# Patient Record
Sex: Female | Born: 1985 | Race: Black or African American | Hispanic: No | Marital: Single | Smoking: Never smoker
Health system: Southern US, Community
[De-identification: ages and names within clinical notes are randomized; demographics above are authoritative.]

## PROBLEM LIST (undated history)

## (undated) DIAGNOSIS — Z8751 Personal history of pre-term labor: Secondary | ICD-10-CM

## (undated) DIAGNOSIS — I959 Hypotension, unspecified: Secondary | ICD-10-CM

## (undated) DIAGNOSIS — M419 Scoliosis, unspecified: Secondary | ICD-10-CM

## (undated) DIAGNOSIS — IMO0002 Reserved for concepts with insufficient information to code with codable children: Secondary | ICD-10-CM

## (undated) DIAGNOSIS — Z8759 Personal history of other complications of pregnancy, childbirth and the puerperium: Secondary | ICD-10-CM

## (undated) DIAGNOSIS — B977 Papillomavirus as the cause of diseases classified elsewhere: Secondary | ICD-10-CM

## (undated) DIAGNOSIS — F319 Bipolar disorder, unspecified: Secondary | ICD-10-CM

## (undated) DIAGNOSIS — B009 Herpesviral infection, unspecified: Secondary | ICD-10-CM

## (undated) DIAGNOSIS — F419 Anxiety disorder, unspecified: Secondary | ICD-10-CM

## (undated) DIAGNOSIS — D649 Anemia, unspecified: Secondary | ICD-10-CM

## (undated) HISTORY — DX: Reserved for concepts with insufficient information to code with codable children: IMO0002

## (undated) HISTORY — DX: Papillomavirus as the cause of diseases classified elsewhere: B97.7

## (undated) HISTORY — PX: INDUCED ABORTION: SHX677

## (undated) HISTORY — PX: ABDOMINAL HYSTERECTOMY: SHX81

## (undated) HISTORY — PX: CERVICAL CERCLAGE: SHX1329

---
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
## 2012-04-03 DIAGNOSIS — M542 Cervicalgia: Secondary | ICD-10-CM | POA: Insufficient documentation

## 2013-06-08 DIAGNOSIS — IMO0002 Reserved for concepts with insufficient information to code with codable children: Secondary | ICD-10-CM | POA: Insufficient documentation

## 2013-08-02 DIAGNOSIS — N9089 Other specified noninflammatory disorders of vulva and perineum: Secondary | ICD-10-CM | POA: Insufficient documentation

## 2013-08-19 DIAGNOSIS — M419 Scoliosis, unspecified: Secondary | ICD-10-CM | POA: Insufficient documentation

## 2013-08-19 DIAGNOSIS — F319 Bipolar disorder, unspecified: Secondary | ICD-10-CM | POA: Insufficient documentation

## 2013-09-22 DIAGNOSIS — F1120 Opioid dependence, uncomplicated: Secondary | ICD-10-CM | POA: Insufficient documentation

## 2013-10-06 ENCOUNTER — Emergency Department: Payer: Self-pay | Admitting: Emergency Medicine

## 2013-10-06 LAB — CBC WITH DIFFERENTIAL/PLATELET
BASOS ABS: 0 10*3/uL (ref 0.0–0.1)
Basophil %: 0.3 %
Eosinophil #: 0.1 10*3/uL (ref 0.0–0.7)
Eosinophil %: 1.4 %
HCT: 38.8 % (ref 35.0–47.0)
HGB: 12.9 g/dL (ref 12.0–16.0)
Lymphocyte #: 0.4 10*3/uL — ABNORMAL LOW (ref 1.0–3.6)
Lymphocyte %: 5.2 %
MCH: 29.3 pg (ref 26.0–34.0)
MCHC: 33.4 g/dL (ref 32.0–36.0)
MCV: 88 fL (ref 80–100)
MONOS PCT: 5.8 %
Monocyte #: 0.5 x10 3/mm (ref 0.2–0.9)
Neutrophil #: 6.8 10*3/uL — ABNORMAL HIGH (ref 1.4–6.5)
Neutrophil %: 87.3 %
PLATELETS: 156 10*3/uL (ref 150–440)
RBC: 4.42 10*6/uL (ref 3.80–5.20)
RDW: 14.6 % — ABNORMAL HIGH (ref 11.5–14.5)
WBC: 7.7 10*3/uL (ref 3.6–11.0)

## 2013-10-06 LAB — RAPID INFLUENZA A&B ANTIGENS (ARMC ONLY)

## 2013-10-06 LAB — BASIC METABOLIC PANEL
Anion Gap: 5 — ABNORMAL LOW (ref 7–16)
BUN: 4 mg/dL — AB (ref 7–18)
CHLORIDE: 104 mmol/L (ref 98–107)
CO2: 26 mmol/L (ref 21–32)
Calcium, Total: 8.4 mg/dL — ABNORMAL LOW (ref 8.5–10.1)
Creatinine: 0.53 mg/dL — ABNORMAL LOW (ref 0.60–1.30)
EGFR (African American): >60
Glucose: 77 mg/dL (ref 65–99)
OSMOLALITY: 266 (ref 275–301)
POTASSIUM: 3.6 mmol/L (ref 3.5–5.1)
Sodium: 135 mmol/L — ABNORMAL LOW (ref 136–145)

## 2013-10-10 DIAGNOSIS — M7918 Myalgia, other site: Secondary | ICD-10-CM | POA: Insufficient documentation

## 2013-10-27 DIAGNOSIS — F1120 Opioid dependence, uncomplicated: Secondary | ICD-10-CM | POA: Insufficient documentation

## 2013-10-30 ENCOUNTER — Observation Stay: Payer: Self-pay | Admitting: Obstetrics and Gynecology

## 2013-10-30 LAB — URINALYSIS, COMPLETE
BILIRUBIN, UR: NEGATIVE
Blood: NEGATIVE
Glucose,UR: NEGATIVE mg/dL (ref 0–75)
LEUKOCYTE ESTERASE: NEGATIVE
Nitrite: NEGATIVE
PH: 5 (ref 4.5–8.0)
Protein: 30
RBC,UR: 1 /HPF (ref 0–5)
Specific Gravity: 1.03 (ref 1.003–1.030)
Squamous Epithelial: 7

## 2013-11-01 LAB — URINE CULTURE

## 2013-11-25 DIAGNOSIS — Z006 Encounter for examination for normal comparison and control in clinical research program: Secondary | ICD-10-CM | POA: Insufficient documentation

## 2013-12-15 ENCOUNTER — Emergency Department: Payer: Self-pay | Admitting: Emergency Medicine

## 2014-01-11 ENCOUNTER — Observation Stay: Payer: Self-pay

## 2014-01-11 LAB — URINALYSIS, COMPLETE
Bilirubin,UR: NEGATIVE
Blood: NEGATIVE
GLUCOSE, UR: NEGATIVE mg/dL (ref 0–75)
KETONE: NEGATIVE
Nitrite: NEGATIVE
Ph: 7 (ref 4.5–8.0)
Protein: NEGATIVE
RBC,UR: 1 /HPF (ref 0–5)
Specific Gravity: 1.009 (ref 1.003–1.030)
WBC UR: 13 /HPF (ref 0–5)

## 2014-01-11 LAB — DRUG SCREEN, URINE

## 2014-01-11 LAB — FETAL FIBRONECTIN
Appearance: NORMAL
Fetal Fibronectin: NEGATIVE

## 2014-01-23 DIAGNOSIS — G43909 Migraine, unspecified, not intractable, without status migrainosus: Secondary | ICD-10-CM | POA: Insufficient documentation

## 2014-01-23 DIAGNOSIS — A5901 Trichomonal vulvovaginitis: Secondary | ICD-10-CM | POA: Insufficient documentation

## 2014-01-23 HISTORY — DX: Trichomonal vulvovaginitis: A59.01

## 2014-01-28 ENCOUNTER — Observation Stay: Payer: Self-pay | Admitting: Obstetrics and Gynecology

## 2014-01-29 LAB — URINALYSIS, COMPLETE
BACTERIA: NONE SEEN
BILIRUBIN, UR: NEGATIVE
Blood: NEGATIVE
GLUCOSE, UR: NEGATIVE mg/dL (ref 0–75)
Leukocyte Esterase: NEGATIVE
NITRITE: NEGATIVE
PH: 5 (ref 4.5–8.0)
PROTEIN: NEGATIVE
RBC,UR: 1 /HPF (ref 0–5)
Specific Gravity: 1.019 (ref 1.003–1.030)
WBC UR: 1 /HPF (ref 0–5)

## 2014-01-29 LAB — DRUG SCREEN, URINE
Amphetamines, Ur Screen: NEGATIVE (ref ?–1000)
BARBITURATES, UR SCREEN: NEGATIVE (ref ?–200)
Benzodiazepine, Ur Scrn: NEGATIVE (ref ?–200)
CANNABINOID 50 NG, UR ~~LOC~~: NEGATIVE (ref ?–50)
Cocaine Metabolite,Ur ~~LOC~~: NEGATIVE (ref ?–300)
MDMA (ECSTASY) UR SCREEN: NEGATIVE (ref ?–500)
METHADONE, UR SCREEN: NEGATIVE (ref ?–300)
OPIATE, UR SCREEN: NEGATIVE (ref ?–300)
PHENCYCLIDINE (PCP) UR S: NEGATIVE (ref ?–25)
TRICYCLIC, UR SCREEN: NEGATIVE (ref ?–1000)

## 2014-01-29 LAB — FETAL FIBRONECTIN
APPEARANCE: NORMAL
Fetal Fibronectin: NEGATIVE

## 2014-01-29 LAB — WET PREP, GENITAL

## 2014-01-29 LAB — GC/CHLAMYDIA PROBE AMP

## 2014-01-30 LAB — BETA STREP CULTURE(ARMC)

## 2014-02-13 ENCOUNTER — Observation Stay: Payer: Self-pay

## 2014-02-13 LAB — DRUG SCREEN, URINE
Amphetamines, Ur Screen: NEGATIVE (ref ?–1000)
BENZODIAZEPINE, UR SCRN: NEGATIVE (ref ?–200)
Barbiturates, Ur Screen: NEGATIVE (ref ?–200)
COCAINE METABOLITE, UR ~~LOC~~: NEGATIVE (ref ?–300)
Cannabinoid 50 Ng, Ur ~~LOC~~: NEGATIVE (ref ?–50)
MDMA (Ecstasy)Ur Screen: NEGATIVE (ref ?–500)
Methadone, Ur Screen: NEGATIVE (ref ?–300)
Opiate, Ur Screen: NEGATIVE (ref ?–300)
Phencyclidine (PCP) Ur S: NEGATIVE (ref ?–25)
Tricyclic, Ur Screen: NEGATIVE (ref ?–1000)

## 2014-02-20 ENCOUNTER — Observation Stay: Payer: Self-pay

## 2014-03-03 ENCOUNTER — Observation Stay: Payer: Self-pay | Admitting: Obstetrics and Gynecology

## 2014-03-04 ENCOUNTER — Observation Stay: Payer: Self-pay | Admitting: Emergency Medicine

## 2014-06-20 ENCOUNTER — Emergency Department: Payer: Self-pay | Admitting: Internal Medicine

## 2014-09-13 ENCOUNTER — Emergency Department: Payer: Self-pay | Admitting: Emergency Medicine

## 2014-10-11 ENCOUNTER — Emergency Department: Payer: Self-pay | Admitting: Emergency Medicine

## 2014-10-11 LAB — CBC WITH DIFFERENTIAL/PLATELET
BASOS PCT: 0.9 %
Basophil #: 0.1 10*3/uL (ref 0.0–0.1)
Eosinophil #: 0.1 10*3/uL (ref 0.0–0.7)
Eosinophil %: 1.9 %
HCT: 37 % (ref 35.0–47.0)
HGB: 12.3 g/dL (ref 12.0–16.0)
LYMPHS PCT: 37.7 %
Lymphocyte #: 2.2 10*3/uL (ref 1.0–3.6)
MCH: 28 pg (ref 26.0–34.0)
MCHC: 33.2 g/dL (ref 32.0–36.0)
MCV: 84 fL (ref 80–100)
Monocyte #: 0.4 x10 3/mm (ref 0.2–0.9)
Monocyte %: 7.4 %
NEUTROS ABS: 3.1 10*3/uL (ref 1.4–6.5)
Neutrophil %: 52.1 %
Platelet: 210 10*3/uL (ref 150–440)
RBC: 4.39 10*6/uL (ref 3.80–5.20)
RDW: 14.1 % (ref 11.5–14.5)
WBC: 5.9 10*3/uL (ref 3.6–11.0)

## 2014-10-11 LAB — COMPREHENSIVE METABOLIC PANEL
ALT: 12 U/L — AB (ref 14–63)
Albumin: 3.8 g/dL (ref 3.4–5.0)
Alkaline Phosphatase: 54 U/L (ref 46–116)
Anion Gap: 8 (ref 7–16)
BUN: 7 mg/dL (ref 7–18)
Bilirubin,Total: 0.8 mg/dL (ref 0.2–1.0)
CALCIUM: 8.9 mg/dL (ref 8.5–10.1)
CHLORIDE: 106 mmol/L (ref 98–107)
CO2: 26 mmol/L (ref 21–32)
Creatinine: 0.8 mg/dL (ref 0.60–1.30)
EGFR (African American): >60
EGFR (Non-African Amer.): >60
Glucose: 83 mg/dL (ref 65–99)
OSMOLALITY: 277 (ref 275–301)
POTASSIUM: 3.8 mmol/L (ref 3.5–5.1)
SGOT(AST): 20 U/L (ref 15–37)
Sodium: 140 mmol/L (ref 136–145)
TOTAL PROTEIN: 7.7 g/dL (ref 6.4–8.2)

## 2014-10-11 LAB — URINALYSIS, COMPLETE
Bilirubin,UR: NEGATIVE
Blood: NEGATIVE
Glucose,UR: NEGATIVE mg/dL (ref 0–75)
LEUKOCYTE ESTERASE: NEGATIVE
NITRITE: NEGATIVE
PH: 6 (ref 4.5–8.0)
Protein: 30
RBC,UR: NONE SEEN /HPF (ref 0–5)
Specific Gravity: 1.027 (ref 1.003–1.030)
Squamous Epithelial: 13
WBC UR: 1 /HPF (ref 0–5)

## 2014-10-11 LAB — LIPASE, BLOOD: LIPASE: 100 U/L (ref 73–393)

## 2014-10-11 LAB — HCG, QUANTITATIVE, PREGNANCY: Beta Hcg, Quant.: <1 m[IU]/mL — ABNORMAL LOW

## 2014-11-19 ENCOUNTER — Emergency Department: Payer: Self-pay | Admitting: Emergency Medicine

## 2015-01-23 NOTE — H&P (Signed)
L&D Evaluation:  History Expanded:  HPI 29yo at 20w 2 days who [facility] and who shows today for some cramping. we have no records on her. and she is per her record getting progesterone shots and according tot he patient she is 4 cm dilated. 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 PER the [facility], she is 4cm LONG in her cervix. ANd she is a Sales executivedrug seeker, will call pt for the results if she needs an antibiotic   Gravida 5   Term 0   PreTerm 2   Abortion 2   Living 2   Blood Type (Maternal) pending   Group B Strep Results Maternal (Result >5wks must be treated as unknown) unknown/result > 5 weeks ago    Maternal HIV Unknown   Maternal Syphilis Ab Unknown   Maternal Varicella Unknown   Rubella Results (Maternal) unknown   Maternal T-Dap Unknown   [facility]   Presents with abdominal pain, lower abdominal pain   Patient's Medical History 2 previous preterm deliveries and 2 miscarriages    Patient's Surgical History none    Medications Pre Natal Vitamins    Allergies NKDA   Social History none    Current Prenatal Course Notable For previous preterm deliveries x 2, on progesterone    ROS:  ROS All systems were reviewed.  HEENT, CNS, GI, GU, Respiratory, CV, Renal and Musculoskeletal systems were found to be normal.   Exam:  Vital Signs stable    Urine Protein 3+, 1+bacteria   General other, pt states she is in pain   Mental Status other  a little foggy and not all there.    Chest clear    Heart normal sinus rhythm   Abdomen gravid, non-tender   Estimated Fetal Weight Average for gestational age   Back no CVAT   Edema no edema    Reflexes 1+    Pelvic no external lesions   FHT nl fht   Ucx absent   Skin dry   Lymph no lymphadenopathy    Impression:  Impression dehydration   Plan:  Plan UA,  check for ctx   Comments pt states she uis 4 cm dilated. givent his and the [facility] the cervix will niot be checked. so as not to stir up anyuthign. urine show bacteria but nothing else will send for culture and dc home.   Follow Up Appointment need to schedule. in 1 week   Electronic Signatures: Adria DevonKlett, Jilda Kress (MD)  (Signed 15-Feb-15 20:40)  Authored: L&D Evaluation   Last Updated: 15-Feb-15 20:40 by Adria DevonKlett, Yer Castello (MD)

## 2015-01-23 NOTE — H&P (Signed)
L&D Evaluation:  History:  HPI 29 yo G5P0202 at 6061w1d gestational age (by 12 week ultrasound) who presents with abdominal and back pain that started at 9am this morning and has continued to intensify since.  She has a history of preterm delivery times two, one at 32 weeks and the other at 34 weeks for which she is taking 17 OHP injections weekly.  [facility] as she was living in [place] (she recently moved to [place] with her sister).  She has been taking percocet for ?scoliosis (she is unclear on this point). She states that she has [facility]. Per [facility] records: patient is s/p nl first tri screen, normal anatomy scan, s/p tdap and 3rd trimester labs.  She had a history of trich diagnosed on 5/11 and treated. It was reassessed on her 5/16 visit and she was rx antibiotics again for which she states she just took this week. She is weaning from percocet. Her UDS was negative on her last visit on 01/28/14. She has been given betamethasone in this pregnancy. Her prenatal labs are A+, RI, RPR NR, Hep B neg, HIV neg, Hep C neg, GBS positive   Patient's Medical History 1) bipolar- with history of suicide attempts, 2) scoliosis, 3) HSV-2, 4) migraines, 5) anemia 6) short cervix   Patient's Surgical History ET tubes as child   Medications Pre [place]atal Vitamins  other  percocet   Allergies other, bee stings, coconut, mushroom   Social History none  denies all   Family History Non-Contributory   ROS:  ROS All systems were reviewed.  HEENT, CNS, GI, GU, Respiratory, CV, Renal and Musculoskeletal systems were found to be normal., unless otherwise noted in HPI   Exam:  Vital Signs stable   General no apparent distress   Mental Status clear   Chest clear   Heart normal sinus rhythm   Abdomen gravid, tender with contractions   Estimated Fetal Weight consistent with dates   Back no CVAT   Edema no edema   Pelvic no  external lesions, 4/75/-3   Mebranes Intact   FHT Description 130-140/mod var/+accels/3 variables upon admission   Ucx irregular, 2-10 minutes   Skin no lesions, multiple tattoos   Lymph no lymphadenopathy   Impression:  Impression reactive NST, 1) Intrauterine pregnancy at 1535 weeks gestational age, 2) history of preterm delivery, 3) R/O preterm labor   Plan:  Plan EFM/NST, monitor contractions and for cervical change   Comments 1) R/O PTL 2) Fetus - category I tracing,  Steroids already recieved at a prior visit for fetal lung maturity.  4) urine drug screen for known use of narcotics, and based on exam today 5) GBS positive: Ampicillin when in labor  6) trichomonas: diagnosed and treated on 5/11. treated again on 5/16   Follow Up Appointment need to schedule   Electronic Signatures: Jannet MantisSubudhi, Lashika Erker (CNM)  (Signed 01-Jun-15 17:22)  Authored: L&D Evaluation   Last Updated: 01-Jun-15 17:22 by Jannet MantisSubudhi, Neala Miggins (CNM)

## 2015-01-23 NOTE — H&P (Signed)
L&D Evaluation:  History Expanded:  HPI [age] yo [id number] at 6738wks gestational age (by 12 week ultrasound) who presents for contractions. She reports +FM, denies vb and reports an occassional ctx. She has a history of preterm delivery times two, one at 32 weeks and the other at 34 weeks for which she is taking 17 OHP injections weekly.  [facility] as she was living in [place] ([place] recently moved to [place] with [place] sister).  She has been taking percocet for ?scoliosis (she is unclear on this point). She states that she has [facility]. Per [facility] records: patient is s/p nl first tri screen, normal anatomy scan, s/p tdap and 3rd trimester labs.  She had a history of trich diagnosed on 5/11 and treated. It was reassessed on her 5/16 visit and she was rx antibiotics again for which she states she just took this week. She is weaning from percocet. Her UDS was negative on her last visit on 01/28/14. She has been given betamethasone in this pregnancy. Her prenatal labs are A+, RI, RPR NR, Hep B neg, HIV neg, Hep C neg, GBS positive   Gravida 5   Term 0   PreTerm 2   Abortion 2   Living 3   Blood Type (Maternal) A positive   Group B Strep Results Maternal (Result >5wks must be treated as unknown) unknown/result > 5 weeks ago   Maternal HIV Negative   Maternal Syphilis Ab Nonreactive   Maternal Varicella Immune   Rubella Results (Maternal) immune   Maternal T-Dap Immune   EDC 18-Mar-2014   Presents with contractions   Patient's Medical History 1) bipolar- with history of suicide attempts, 2) scoliosis, 3) HSV-2, 4) migraines, 5) anemia 6) short cervix   Patient's Surgical History ET tubes as child   Medications Pre Natal Vitamins  other  percocet on pain contract   Allergies other, bee stings, coconut, mushroom   Social History none  denies all   Family History Non-Contributory   Current Prenatal Course  Notable For Habitual Abortions   ROS:  ROS All systems were reviewed.  HEENT, CNS, GI, GU, Respiratory, CV, Renal and Musculoskeletal systems were found to be normal., unless otherwise noted in HPI   Exam:  Vital Signs stable   General no apparent distress   Mental Status clear   Chest clear   Heart normal sinus rhythm   Abdomen gravid, non-tender   Estimated Fetal Weight consistent with dates   Back no CVAT, muscular pain   Edema no edema   Pelvic no external lesions   Mebranes Intact   FHT normal rate with no decels   FHT Description 140's at this time +accels   Ucx other, 2 seen on the monitor   Skin no lesions, multiple tattoos   Lymph no lymphadenopathy   Impression:  Impression reactive NST, 1) Intrauterine pregnancy at 36.[redacted] weeks gestational age, 2) history of preterm delivery, 3) fall on abdomen   Plan:  Plan EFM/NST, discharge, precautions for PTL and placental abruption given   Comments pt states she is not [facility] as she had to wait three hours for her lasy a[[y and did not get the US she wanted   Follow Up Appointment already scheduled   Electronic Signatures: Adria DevonKlett, Carrie (MD)  (Signed 19-Jun-15 23:33)  Authored: L&D Evaluation   Last Updated: 19-Jun-15 23:33 by Adria DevonKlett, Carrie (MD)

## 2015-01-23 NOTE — H&P (Signed)
L&D Evaluation:  History Expanded:  HPI 29 yo G5P0202 at 2744w0d gestational age (by 12 week ultrasound) who presents with abdominal and back pain and decreased fetal movement.  She has a history of preterm delivery times two, one at 32 weeks and the other at 34 weeks for which she is taking 17 OHP injections weekly.  [facility] as she was living in [place] ([place] recently moved to [place] with [place] sister).  She started having abdominal and back pain yesterday morning at 10am and this pain kept getting worse.  She also noted decreased fetal movement in that time, but states she noted fetal movement once she arrived here. She has been taking percocet for ?scoliosis (she is unclear on this point). She states that [facility].  No prenatal records are available for her at this time. Per [facility] records: patient is s/p nl first tri screen, normal anatomy scan, s/p tdap and 3rd trimester labs.  She had a history of trich diagnosed on 5/11 and treated. She is weaning from percocet. This week she is to take no more than 22 pills. She is decreasing her pill intake by two pills by 2 per week.   Gravida 5   Term 0   PreTerm 2   Abortion 2   Living 2   Blood Type (Maternal) pending   Group B Strep Results Maternal (Result >5wks must be treated as unknown) unknown/result > 5 weeks ago   [facility]   Patient's Medical History 1) bipolar, 2) scoliosis, 3) HSV-2, 4) migraines   Patient's Surgical History ET tubes as child   Medications 1) Percocet 5/325mg  (up to 3 a day per patient), 2) valtrex 1 tab daily (unsure day)   Allergies NKDA   Social History denies all   ROS:  ROS All systems were reviewed.  HEENT, CNS, GI, GU, Respiratory, CV, Renal and Musculoskeletal systems were found to be normal., unless otherwise noted in HPI   Exam:  Vital Signs BP 111/55, P 66   General no apparent distress   Mental Status somewhat  slurred in speech, some delay in response to questions   Chest clear   Heart normal sinus rhythm   Abdomen gravid, non-tender   Estimated Fetal Weight consistent with dates   Fetal Position cephalic by bedside ultrasound   Back no CVAT   Edema no edema   Pelvic no external lesions, 3/50/-1   Mebranes Intact   FHT normal rate with no decels   FHT Description 135/mod var/+accels/no decels   Ucx irregular, irritability   Skin no lesions, multiple tattoos   Other Bedside ultrasound:  cephalic presentation, biometry consistent with dates, amniotic fluid grossly normal, placenta anterior   Impression:  Impression 1) Intrauterine pregnancy at 7933 weeks gestational age, 2) history of preterm delivery, 3) preterm labor   Plan:  Comments 1) Labor: tocolytic therapy with nifedipine.  Preterm labor workup with fFN, gonorrhea/chlamydia cultures, GBS, wet prep, UA 2) Fetus - category I tracing,  Steroids for fetal lung maturity.   3) PNL unknown: [facility]. Labs still not received. 4) urine drug screen for known use of narcotics, and based on exam today 5) GBS unk: Ampicillin 6) trichomonas: diagnosed and treated on 5/11.   Labs:  Lab Results:  Routine Micro:  17-May-15 01:48   Micro Text Report WET PREP   COMMENT  MODERATE WHITE BLOOD CELLS SEEN   COMMENT                   TRICHOMONAS SEEN   COMMENT                   NO YEAST SEEN   COMMENT                   NO SPERMATOZOA SEEN   COMMENT                   NO CLUE CELLSSEEN   ANTIBIOTIC                       Comment 1. MODERATE WHITE BLOOD CELLS SEEN  Comment 2. TRICHOMONAS SEEN  Comment 3. NO YEAST SEEN  Comment 4. NO SPERMATOZOA SEEN  Comment 5. NO CLUE CELLS SEEN  Result(s) reported on 29 Jan 2014 at 02:36AM.  Routine UA:  17-May-15 01:48   Color (UA) Yellow  Clarity (UA) Clear  Glucose (UA) Negative  Bilirubin (UA) Negative  Ketones (UA) 2+  Specific Gravity  (UA) 1.019  Blood (UA) Negative  pH (UA) 5.0  Protein (UA) Negative  Nitrite (UA) Negative  Leukocyte Esterase (UA) Negative (Result(s) reported on 29 Jan 2014 at 02:38AM.)  RBC (UA) 1 /HPF  WBC (UA) 1 /HPF  Bacteria (UA) NONE SEEN  Epithelial Cells (UA) 2 /HPF  Mucous (UA) PRESENT (Result(s) reported on 29 Jan 2014 at 02:38AM.)   Electronic Signatures: Conard NovakJackson, Trinda Harlacher D (MD)  (Signed 17-May-15 02:45)  Authored: L&D Evaluation, Labs   Last Updated: 17-May-15 02:45 by Conard NovakJackson, Loann Chahal D (MD)

## 2015-01-23 NOTE — H&P (Signed)
L&D Evaluation:  History:  HPI 29 yo G5P0202 at 5091w1d gestational age (by 12 week ultrasound) who presents after a fall yesterday afternoon. She wa26s calling for her kids who were at her pool and she tumbled down 5 concrete steps. She reports +FM, denies vb and reports an occassional ctx. She reports some back pain after the fall.  She has a history of preterm delivery times two, one at 32 weeks and the other at 34 weeks for which she is taking 17 OHP injections weekly.  [facility] as she was living in [place] ([place] recently moved to [place]).  She has been taking percocet for ?scoliosis (she is unclear on this point). She states that she has [facility]. Per [facility] records: patient is s/p nl first tri screen, normal anatomy scan, s/p tdap and 3rd trimester labs.  She had a history of trich diagnosed on 5/11 and treated. It was reassessed on her 5/16 visit and she was rx antibiotics again for which she states she just took this week. She is weaning from percocet. Her UDS was negative on her last visit on 01/28/14. She has been given betamethasone in this pregnancy. Her prenatal labs are A+, RI, RPR NR, Hep B neg, HIV neg, Hep C neg, GBS positive   Patient's Medical History 1) bipolar- with history of suicide attempts, 2) scoliosis, 3) HSV-2, 4) migraines, 5) anemia 6) short cervix   Patient's Surgical History ET tubes as child   Medications Pre [place]atal Vitamins  other  percocet   Allergies other, bee stings, coconut, mushroom   Social History none  denies all   Family History Non-Contributory   ROS:  ROS All systems were reviewed.  HEENT, CNS, GI, GU, Respiratory, CV, Renal and Musculoskeletal systems were found to be normal., unless otherwise noted in HPI   Exam:  Vital Signs stable   General no apparent distress   Mental Status clear   Chest clear   Heart normal sinus rhythm   Abdomen gravid,  non-tender   Estimated Fetal Weight consistent with dates   Back no CVAT, muscular pain   Edema no edema   Pelvic no external lesions   Mebranes Intact   FHT normal rate with no decels   FHT Description 140's at this time +accels   Ucx other, 2 seen on the monitor   Skin no lesions, multiple tattoos   Lymph no lymphadenopathy   Impression:  Impression reactive NST, 1) Intrauterine pregnancy at 36.[redacted] weeks gestational age, 2) history of preterm delivery, 3) fall on abdomen   Plan:  Plan EFM/NST, discharge, precautions for PTL and placental abruption given   Follow Up Appointment already scheduled. Friday 6/12 at [facility]   Electronic Signatures: Jannet MantisSubudhi, Kanita Delage (CNM)  (Signed 08-Jun-15 05:55)  Authored: L&D Evaluation   Last Updated: 08-Jun-15 05:55 by Jannet MantisSubudhi, Mckaylin Bastien (CNM)

## 2015-02-04 ENCOUNTER — Emergency Department: Payer: Medicaid Other

## 2015-02-04 ENCOUNTER — Emergency Department
Admission: EM | Admit: 2015-02-04 | Discharge: 2015-02-04 | Disposition: A | Discharge status: Home / Self Care | Payer: Medicaid Other | Attending: Emergency Medicine | Admitting: Emergency Medicine

## 2015-02-04 ENCOUNTER — Encounter: Payer: Self-pay | Admitting: Emergency Medicine

## 2015-02-04 DIAGNOSIS — G8929 Other chronic pain: Secondary | ICD-10-CM | POA: Diagnosis not present

## 2015-02-04 DIAGNOSIS — M7918 Myalgia, other site: Secondary | ICD-10-CM

## 2015-02-04 DIAGNOSIS — M879 Osteonecrosis, unspecified: Secondary | ICD-10-CM | POA: Insufficient documentation

## 2015-02-04 DIAGNOSIS — M546 Pain in thoracic spine: Secondary | ICD-10-CM | POA: Insufficient documentation

## 2015-02-04 DIAGNOSIS — M549 Dorsalgia, unspecified: Secondary | ICD-10-CM | POA: Diagnosis present

## 2015-02-04 HISTORY — DX: Bipolar disorder, unspecified: F31.9

## 2015-02-04 HISTORY — DX: Scoliosis, unspecified: M41.9

## 2015-02-04 NOTE — ED Notes (Signed)
Pt reports left upper back pain for past couple of days. Denies injury. Tender to touch. "Pt states I use my left side the most because I am left handed."

## 2015-02-04 NOTE — ED Provider Notes (Signed)
[facility] Emergency Department Provider Note  ____________________________________________  Time seen: 1306  I have reviewed the triage vital signs and the nursing notes.   HISTORY  Chief Complaint Back Pain   HPI Delorse LekSasha K Plake is a 29 y.o. female is in today with complaint of left upper back pain for the last 2-3 days. She states that she is having problems with range of motion. Currently she is taking Percocet and anti-inflammatory and muscle relaxant. She is a patient of the pain clinic in [place]. She has a history of chronic back pain. She denies ever having her left shoulder x-ray, she denies any recent injury. She states that she is left-handed. Currently she rates her pain 9 out of 10. She is taking Depo and doesn't have periods.   Past Medical History  Diagnosis Date  . Scoliosis   . Bipolar 1 disorder     There are no active problems to display for this patient.   Past Surgical History  Procedure Laterality Date  . Induced abortion      No current outpatient prescriptions on file.  Allergies Bee venom and Mushroom extract complex  No family history on file.  Social History History  Substance Use Topics  . Smoking status: Never Smoker   . Smokeless tobacco: Not on file  . Alcohol Use: No    Review of Systems Constitutional: No fever/chills Eyes: No visual changes. ENT: No sore throat. Cardiovascular: Denies chest pain. Respiratory: Denies shortness of breath. Gastrointestinal: No abdominal pain.  No nausea, no vomiting.  No diarrhea.  No constipation. Genitourinary: Negative for dysuria. Musculoskeletal: Positive for chronic back pain  Skin: Negative for rash. Neurological: Negative for headaches, focal weakness or numbness.  10-point ROS otherwise negative.  ____________________________________________   PHYSICAL EXAM:  VITAL SIGNS: ED Triage Vitals  Enc Vitals Group     BP 02/04/15 1210 123/43 mmHg     Pulse Rate  02/04/15 1210 81     Resp 02/04/15 1210 16     Temp 02/04/15 1210 98.7 F (37.1 C)     Temp Source 02/04/15 1210 Oral     SpO2 02/04/15 1210 100 %     Weight 02/04/15 1210 175 lb (79.379 kg)     Height 02/04/15 1210 5\' 7"  (1.702 m)     Head Cir --      Peak Flow --      Pain Score 02/04/15 1213 9     Pain Loc --      Pain Edu? --      Excl. in GC? --     Constitutional: Alert and oriented. Well appearing and in no acute distress.  Eyes: Conjunctivae are normal. PERRL. EOMI. Head: Atraumatic. Nose: No congestion/rhinnorhea. Mouth/Throat: Mucous membranes are moist.  Neck: No stridor.   Cardiovascular: Normal rate, regular rhythm. Grossly normal heart sounds.  Good peripheral circulation. Respiratory: Normal respiratory effort.  No retractions. Lungs CTAB. Gastrointestinal: Soft and nontender. No distention. No abdominal bruits. No CVA tenderness. Musculoskeletal: No lower extremity tenderness nor edema.  No joint effusions. Left posterior shoulder necrosed deformity there is moderate tenderness on palpation of the muscle area. Range of motion is restricted due to discomfort. Pulses equal bilaterally. Grip strength equal bilaterally. Neurologic:  Normal speech and language. No gross focal neurologic deficits are appreciated. Speech is normal. No gait instability. Skin:  Skin is warm, dry and intact. No rash noted. Psychiatric: Mood and affect are normal. Speech and behavior are normal.  ____________________________________________  LABS (all labs ordered are listed, but only abnormal results are displayed)  Labs Reviewed - No data to display RADIOLOGY  X-ray per radiologist shows no evidence of fracture or dislocation. Soft tissues unremarkable ____________________________________________   PROCEDURES  Procedure(s) performed: None  Critical Care performed: No  ____________________________________________   INITIAL IMPRESSION / ASSESSMENT AND PLAN / ED  COURSE  Pertinent labs & imaging results that were available during my care of the patient were reviewed by me and considered in my medical decision making (see chart for details).  Patient was given name of Dr. Rosita Kea to follow-up with. She is also to continue seeing her pain clinic doctor in [place]. She currently she has medication at home. She was also given a sling for comfort. ____________________________________________   FINAL CLINICAL IMPRESSION(S) / ED DIAGNOSES  Final diagnoses:  Muscle pain, myofascial      Tommi Rumps, PA-C 02/04/15 1439  Sharman Cheek, MD 02/06/15 [phone]

## 2015-02-07 ENCOUNTER — Emergency Department
Admission: EM | Admit: 2015-02-07 | Discharge: 2015-02-07 | Disposition: A | Discharge status: Home / Self Care | Payer: Medicaid Other | Attending: Emergency Medicine | Admitting: Emergency Medicine

## 2015-02-07 DIAGNOSIS — J069 Acute upper respiratory infection, unspecified: Secondary | ICD-10-CM | POA: Diagnosis not present

## 2015-02-07 DIAGNOSIS — R0981 Nasal congestion: Secondary | ICD-10-CM | POA: Diagnosis present

## 2015-02-07 MED ORDER — PROMETHAZINE-DM 6.25-15 MG/5ML PO SYRP: 5.0000 mL | ORAL_SOLUTION | Freq: Four times a day (QID) | ORAL | Status: DC | PRN

## 2015-02-07 NOTE — ED Notes (Signed)
Nasal congestion X 2 days ago. Pt thinks she may have URI. Pt alert and oriented X4, active, cooperative, pt in NAD. RR even and unlabored, color WNL.

## 2015-02-07 NOTE — ED Provider Notes (Signed)
[facility] Emergency Department Provider Note  ____________________________________________  Time seen: 1353  I have reviewed the triage vital signs and the nursing notes.   HISTORY  Chief Complaint Nasal Congestion   HPI Lisa Rowland is a 29 y.o. female complains today of nasal congestions, sore throat, nasal drainage. She denies any known fever. "I don't feel well". She states she's been taking Mucinex, DayQuil and NyQuil without any relief. She denies any difficulty breathing, nausea or vomiting. Her pain is minimal.   Past Medical History  Diagnosis Date  . Scoliosis   . Bipolar 1 disorder     There are no active problems to display for this patient.   Past Surgical History  Procedure Laterality Date  . Induced abortion      Current Outpatient Rx  Name  Route  Sig  Dispense  Refill  . promethazine-dextromethorphan (PROMETHAZINE-DM) 6.25-15 MG/5ML syrup   Oral   Take 5 mLs by mouth 4 (four) times daily as needed for cough.   118 mL   0     Allergies Bee venom; Mushroom extract complex; and Coconut oil  No family history on file.  Social History History  Substance Use Topics  . Smoking status: Never Smoker   . Smokeless tobacco: Not on file  . Alcohol Use: No    Review of Systems Constitutional: No fever/chills ENT: Positive sore throat. Cardiovascular: Denies chest pain. Respiratory: Denies shortness of breath. Gastrointestinal: No abdominal pain.  No nausea, no vomiting.  No diarrhea.  No constipation. Genitourinary: Negative for dysuria. Musculoskeletal: Negative for back pain. Skin: Negative for rash. Neurological: Negative for headaches, focal weakness or numbness.  10-point ROS otherwise negative.  ____________________________________________   PHYSICAL EXAM:  VITAL SIGNS: ED Triage Vitals  Enc Vitals Group     BP 02/07/15 1340 112/81 mmHg     Pulse Rate 02/07/15 1340 100     Resp 02/07/15 1340 16     Temp  02/07/15 1340 97.8 F (36.6 C)     Temp Source 02/07/15 1340 Oral     SpO2 02/07/15 1340 97 %     Weight 02/07/15 1340 175 lb (79.379 kg)     Height 02/07/15 1340  (1.702 m)     Head Cir --      Peak Flow --      Pain Score --      Pain Loc --      Pain Edu? --      Excl. in GC? --     Constitutional: Alert and oriented. Well appearing and in no acute distress. Eyes: Conjunctivae are normal. PERRL. EOMI. Head: Atraumatic. Nose: Positive congestion/rhinnorhea. Mouth/Throat: Mucous membranes are moist.  Oropharynx non-erythematous. Posterior drainage positive Neck: No stridor.  Supple Hematological/Lymphatic/Immunilogical: No cervical lymphadenopathy. Cardiovascular: Normal rate, regular rhythm. Grossly normal heart sounds.  Good peripheral circulation. Respiratory: Normal respiratory effort.  No retractions. Lungs CTAB. Gastrointestinal: Soft and nontender. No distention.. Musculoskeletal: No lower extremity tenderness nor edema.  No joint effusions. Neurologic:  Normal speech and language. No gross focal neurologic deficits are appreciated. Speech is normal. No gait instability. Skin:  Skin is warm, dry and intact. No rash noted. Psychiatric: Mood and affect are normal. Speech and behavior are normal.  ____________________________________________   LABS (all labs ordered are listed, but only abnormal results are displayed)  Labs Reviewed - No data to display  PROCEDURES  Procedure(s) performed: None  Critical Care performed: No  ____________________________________________   INITIAL IMPRESSION /  ASSESSMENT AND PLAN / ED COURSE  Pertinent labs & imaging results that were available during my care of the patient were reviewed by me and considered in my medical decision making (see chart for details).  Patient wanted prescription medication. She was written a prescription for Phenergan DM for cough and congestion. She was told to increase fluids, Tylenol or Motrin  for fever or body aches ____________________________________________   FINAL CLINICAL IMPRESSION(S) / ED DIAGNOSES  Final diagnoses:  Acute upper respiratory infection      Tommi RumpsRhonda L Summers, PA-C 02/07/15 1500

## 2015-02-07 NOTE — Discharge Instructions (Signed)
Upper Respiratory Infection, Adult An upper respiratory infection (URI) is also sometimes known as the common cold. The upper respiratory tract includes the nose, sinuses, throat, trachea, and bronchi. Bronchi are the airways leading to the lungs. Most people improve within 1 week, but symptoms can last up to 2 weeks. A residual cough may last even longer.  CAUSES Many different viruses can infect the tissues lining the upper respiratory tract. The tissues become irritated and inflamed and often become very moist. Mucus production is also common. A cold is contagious. You can easily spread the virus to others by oral contact. This includes kissing, sharing a glass, coughing, or sneezing. Touching your mouth or nose and then touching a surface, which is then touched by another person, can also spread the virus. SYMPTOMS  Symptoms typically develop 1 to 3 days after you come in contact with a cold virus. Symptoms vary from person to person. They may include:  Runny nose.  Sneezing.  Nasal congestion.  Sinus irritation.  Sore throat.  Loss of voice (laryngitis).  Cough.  Fatigue.  Muscle aches.  Loss of appetite.  Headache.  Low-grade fever. DIAGNOSIS  You might diagnose your own cold based on familiar symptoms, since most people get a cold 2 to 3 times a year. Your caregiver can confirm this based on your exam. Most importantly, your caregiver can check that your symptoms are not due to another disease such as strep throat, sinusitis, pneumonia, asthma, or epiglottitis. Blood tests, throat tests, and X-rays are not necessary to diagnose a common cold, but they may sometimes be helpful in excluding other more serious diseases. Your caregiver will decide if any further tests are required. RISKS AND COMPLICATIONS  You may be at risk for a more severe case of the common cold if you smoke cigarettes, have chronic heart disease (such as heart failure) or lung disease (such as asthma), or if  you have a weakened immune system. The very young and very old are also at risk for more serious infections. Bacterial sinusitis, middle ear infections, and bacterial pneumonia can complicate the common cold. The common cold can worsen asthma and chronic obstructive pulmonary disease (COPD). Sometimes, these complications can require emergency medical care and may be life-threatening. PREVENTION  The best way to protect against getting a cold is to practice good hygiene. Avoid oral or hand contact with people with cold symptoms. Wash your hands often if contact occurs. There is no clear evidence that vitamin C, vitamin E, echinacea, or exercise reduces the chance of developing a cold. However, it is always recommended to get plenty of rest and practice good nutrition. TREATMENT  Treatment is directed at relieving symptoms. There is no cure. Antibiotics are not effective, because the infection is caused by a virus, not by bacteria. Treatment may include:  Increased fluid intake. Sports drinks offer valuable electrolytes, sugars, and fluids.  Breathing heated mist or steam (vaporizer or shower).  Eating chicken soup or other clear broths, and maintaining good nutrition.  Getting plenty of rest.  Using gargles or lozenges for comfort.  Controlling fevers with ibuprofen or acetaminophen as directed by your caregiver.  Increasing usage of your inhaler if you have asthma. Zinc gel and zinc lozenges, taken in the first 24 hours of the common cold, can shorten the duration and lessen the severity of symptoms. Pain medicines may help with fever, muscle aches, and throat pain. A variety of non-prescription medicines are available to treat congestion and runny nose. Your caregiver   can make recommendations and may suggest nasal or lung inhalers for other symptoms.  HOME CARE INSTRUCTIONS   Only take over-the-counter or prescription medicines for pain, discomfort, or fever as directed by your  caregiver.  Use a warm mist humidifier or inhale steam from a shower to increase air moisture. This may keep secretions moist and make it easier to breathe.  Drink enough water and fluids to keep your urine clear or pale yellow.  Rest as needed.  Return to work when your temperature has returned to normal or as your caregiver advises. You may need to stay home longer to avoid infecting others. You can also use a face mask and careful hand washing to prevent spread of the virus. SEEK MEDICAL CARE IF:   After the first few days, you feel you are getting worse rather than better.  You need your caregiver's advice about medicines to control symptoms.  You develop chills, worsening shortness of breath, or brown or red sputum. These may be signs of pneumonia.  You develop yellow or brown nasal discharge or pain in the face, especially when you bend forward. These may be signs of sinusitis.  You develop a fever, swollen neck glands, pain with swallowing, or white areas in the back of your throat. These may be signs of strep throat. SEEK IMMEDIATE MEDICAL CARE IF:   You have a fever.  You develop severe or persistent headache, ear pain, sinus pain, or chest pain.  You develop wheezing, a prolonged cough, cough up blood, or have a change in your usual mucus (if you have chronic lung disease).  You develop sore muscles or a stiff neck. Document Released: 02/25/2001 Document Revised: 11/24/2011 Document Reviewed: 12/07/2013 [employer] Patient Information 2015 [employer]. This information is not intended to replace advice given to you by your health care provider. Make sure you discuss any questions you have with your health care provider.  

## 2015-02-07 NOTE — ED Notes (Signed)
Pt states nasal congestion, sore throat, and states "I dont feel well", pt states nasal drainage, pt awake and alert during assessment in no distress

## 2015-04-13 ENCOUNTER — Encounter: Payer: Self-pay | Admitting: *Deleted

## 2015-04-13 ENCOUNTER — Emergency Department
Admission: EM | Admit: 2015-04-13 | Discharge: 2015-04-13 | Discharge status: Against Medical Advice | Payer: Medicaid Other | Attending: Emergency Medicine | Admitting: Emergency Medicine

## 2015-04-13 ENCOUNTER — Emergency Department: Payer: Medicaid Other

## 2015-04-13 DIAGNOSIS — Y998 Other external cause status: Secondary | ICD-10-CM | POA: Insufficient documentation

## 2015-04-13 DIAGNOSIS — Z79899 Other long term (current) drug therapy: Secondary | ICD-10-CM | POA: Diagnosis not present

## 2015-04-13 DIAGNOSIS — Z791 Long term (current) use of non-steroidal anti-inflammatories (NSAID): Secondary | ICD-10-CM | POA: Diagnosis not present

## 2015-04-13 DIAGNOSIS — Y92410 Unspecified street and highway as the place of occurrence of the external cause: Secondary | ICD-10-CM | POA: Insufficient documentation

## 2015-04-13 DIAGNOSIS — S3992XA Unspecified injury of lower back, initial encounter: Secondary | ICD-10-CM | POA: Insufficient documentation

## 2015-04-13 DIAGNOSIS — Y9389 Activity, other specified: Secondary | ICD-10-CM | POA: Diagnosis not present
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
## 2015-04-13 NOTE — ED Notes (Signed)
Patient transported to X-ray 
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
## 2015-04-13 NOTE — Discharge Instructions (Signed)

## 2015-05-29 ENCOUNTER — Emergency Department
Admission: EM | Admit: 2015-05-29 | Discharge: 2015-05-29 | Disposition: A | Discharge status: Home / Self Care | Payer: Medicaid Other | Attending: Emergency Medicine | Admitting: Emergency Medicine

## 2015-05-29 ENCOUNTER — Encounter: Payer: Self-pay | Admitting: Emergency Medicine

## 2015-05-29 DIAGNOSIS — J069 Acute upper respiratory infection, unspecified: Secondary | ICD-10-CM | POA: Diagnosis not present

## 2015-05-29 DIAGNOSIS — Z79899 Other long term (current) drug therapy: Secondary | ICD-10-CM | POA: Diagnosis not present

## 2015-05-29 DIAGNOSIS — I10 Essential (primary) hypertension: Secondary | ICD-10-CM | POA: Diagnosis not present

## 2015-05-29 DIAGNOSIS — Z791 Long term (current) use of non-steroidal anti-inflammatories (NSAID): Secondary | ICD-10-CM | POA: Insufficient documentation

## 2015-05-29 DIAGNOSIS — Z79891 Long term (current) use of opiate analgesic: Secondary | ICD-10-CM | POA: Diagnosis not present

## 2015-05-29 DIAGNOSIS — R0981 Nasal congestion: Secondary | ICD-10-CM | POA: Diagnosis present

## 2015-05-29 HISTORY — DX: Hypotension, unspecified: I95.9

## 2015-05-29 HISTORY — DX: Anxiety disorder, unspecified: F41.9

## 2015-05-29 HISTORY — DX: Anemia, unspecified: D64.9

## 2015-05-29 MED ORDER — IBUPROFEN 800 MG PO TABS: 800.0000 mg | ORAL_TABLET | Freq: Three times a day (TID) | ORAL | Status: DC | PRN

## 2015-05-29 MED ORDER — AZITHROMYCIN 250 MG PO TABS: ORAL_TABLET | ORAL | Status: DC

## 2015-05-29 MED ORDER — GUAIFENESIN-CODEINE 100-10 MG/5ML PO SOLN: 10.0000 mL | ORAL | Status: DC | PRN

## 2015-05-29 NOTE — ED Notes (Signed)
Called pt in lobby without response 

## 2015-05-29 NOTE — ED Provider Notes (Signed)
[facility] Emergency Department Provider Note  ____________________________________________  Time seen: Approximately 8:02 PM  I have reviewed the triage vital signs and the nursing notes.   HISTORY  Chief Complaint Nasal Congestion; Sore Throat; and Generalized Body Aches    HPI Lisa Rowland is a 29 y.o. female presents for evaluation of sore throat generalized body aches and malaise for couple days. Complains of congestion with yellow drainage runny nose and sinus pressure the whole body aching. Denies any vomiting denies any cough.   Past Medical History  Diagnosis Date  . Scoliosis   . Bipolar 1 disorder   . Hypotension   . Anemia   . Anxiety     There are no active problems to display for this patient.   Past Surgical History  Procedure Laterality Date  . Induced abortion      Current Outpatient Rx  Name  Route  Sig  Dispense  Refill  . azithromycin (ZITHROMAX Z-PAK) 250 MG tablet      Take 2 tablets (500 mg) on  Day 1,  followed by 1 tablet (250 mg) once daily on Days 2 through 5.   6 each   0   . clonazePAM (KLONOPIN) 0.5 MG tablet   Oral   Take 0.5 mg by mouth 3 (three) times daily.         Marland Kitchen guaiFENesin-codeine 100-10 MG/5ML syrup   Oral   Take 10 mLs by mouth every 4 (four) hours as needed for cough.   180 mL   0   . ibuprofen (ADVIL,MOTRIN) 800 MG tablet   Oral   Take 1 tablet (800 mg total) by mouth every 8 (eight) hours as needed.   30 tablet   0   . naproxen (NAPROSYN) 500 MG tablet   Oral   Take 500 mg by mouth 3 (three) times daily with meals.         Marland Kitchen oxyCODONE-acetaminophen (PERCOCET) 10-325 MG per tablet   Oral   Take 1 tablet by mouth 3 (three) times daily.         Marland Kitchen PRESCRIPTION MEDICATION               . promethazine-dextromethorphan (PROMETHAZINE-DM) 6.25-15 MG/5ML syrup   Oral   Take 5 mLs by mouth 4 (four) times daily as needed for cough.   118 mL   0     Allergies Bee venom;  Mushroom extract complex; and Coconut oil  History reviewed. No pertinent family history.  Social History Social History  Substance Use Topics  . Smoking status: Never Smoker   . Smokeless tobacco: None  . Alcohol Use: No    Review of Systems Constitutional: No fever/chills Eyes: No visual changes. ENT: Positive sore throat Cardiovascular: Denies chest pain. Respiratory: Denies shortness of breath. Gastrointestinal: No abdominal pain.  No nausea, no vomiting.  No diarrhea.  No constipation. Genitourinary: Negative for dysuria. Musculoskeletal: Positive body aches Skin: Negative for rash. Neurological: Negative for headaches, focal weakness or numbness.  10-point ROS otherwise negative.  ____________________________________________   PHYSICAL EXAM:  VITAL SIGNS: ED Triage Vitals  Enc Vitals Group     BP 05/29/15 1941 129/72 mmHg     Pulse Rate 05/29/15 1941 76     Resp 05/29/15 1941 18     Temp 05/29/15 1941 98.4 F (36.9 C)     Temp Source 05/29/15 1941 Oral     SpO2 05/29/15 1941 97 %     Weight 05/29/15 1941  186 lb (84.369 kg)     Height 05/29/15 1941 5\' 7"  (1.702 m)     Head Cir --      Peak Flow --      Pain Score 05/29/15 1948 10     Pain Loc --      Pain Edu? --      Excl. in GC? --     Constitutional: Alert and oriented. Well appearing and in no acute distress. Eyes: Conjunctivae are normal. PERRL. EOMI. Head: Atraumatic. Nose: No congestion/rhinnorhea. Mouth/Throat: Mucous membranes are moist.  Oropharynx non-erythematous. Neck: No stridor.   Cardiovascular: Normal rate, regular rhythm. Grossly normal heart sounds.  Good peripheral circulation. Respiratory: Normal respiratory effort.  No retractions. Lungs CTAB. Gastrointestinal: Soft and nontender. No distention. No abdominal bruits. No CVA tenderness. Musculoskeletal: No lower extremity tenderness nor edema.  No joint effusions. Neurologic:  Normal speech and language. No gross focal neurologic  deficits are appreciated. No gait instability. Skin:  Skin is warm, dry and intact. No rash noted. Psychiatric: Mood and affect are normal. Speech and behavior are normal.  ____________________________________________   LABS (all labs ordered are listed, but only abnormal results are displayed)  Labs Reviewed - No data to display   PROCEDURES  Procedure(s) performed: None  Critical Care performed: No  ____________________________________________   INITIAL IMPRESSION / ASSESSMENT AND PLAN / ED COURSE  Pertinent labs & imaging results that were available during my care of the patient were reviewed by me and considered in my medical decision making (see chart for details).  Acute upper respiratory infection. Rx given for Z-Pak, Robitussin-AC. Motrin 800 mg daily. Work excuse given 48 hours patient to follow-up immediately with any worsening symptomology. ____________________________________________   FINAL CLINICAL IMPRESSION(S) / ED DIAGNOSES  Final diagnoses:  URI, acute      Evangeline Dakin, PA-C 05/29/15 2011  Loleta Rose, MD 05/29/15 [phone]

## 2015-05-29 NOTE — Discharge Instructions (Signed)
Upper Respiratory Infection, Adult An upper respiratory infection (URI) is also sometimes known as the common cold. The upper respiratory tract includes the nose, sinuses, throat, trachea, and bronchi. Bronchi are the airways leading to the lungs. Most people improve within 1 week, but symptoms can last up to 2 weeks. A residual cough may last even longer.  CAUSES Many different viruses can infect the tissues lining the upper respiratory tract. The tissues become irritated and inflamed and often become very moist. Mucus production is also common. A cold is contagious. You can easily spread the virus to others by oral contact. This includes kissing, sharing a glass, coughing, or sneezing. Touching your mouth or nose and then touching a surface, which is then touched by another person, can also spread the virus. SYMPTOMS  Symptoms typically develop 1 to 3 days after you come in contact with a cold virus. Symptoms vary from person to person. They may include:  Runny nose.  Sneezing.  Nasal congestion.  Sinus irritation.  Sore throat.  Loss of voice (laryngitis).  Cough.  Fatigue.  Muscle aches.  Loss of appetite.  Headache.  Low-grade fever. DIAGNOSIS  You might diagnose your own cold based on familiar symptoms, since most people get a cold 2 to 3 times a year. Your caregiver can confirm this based on your exam. Most importantly, your caregiver can check that your symptoms are not due to another disease such as strep throat, sinusitis, pneumonia, asthma, or epiglottitis. Blood tests, throat tests, and X-rays are not necessary to diagnose a common cold, but they may sometimes be helpful in excluding other more serious diseases. Your caregiver will decide if any further tests are required. RISKS AND COMPLICATIONS  You may be at risk for a more severe case of the common cold if you smoke cigarettes, have chronic heart disease (such as heart failure) or lung disease (such as asthma), or if  you have a weakened immune system. The very young and very old are also at risk for more serious infections. Bacterial sinusitis, middle ear infections, and bacterial pneumonia can complicate the common cold. The common cold can worsen asthma and chronic obstructive pulmonary disease (COPD). Sometimes, these complications can require emergency medical care and may be life-threatening. PREVENTION  The best way to protect against getting a cold is to practice good hygiene. Avoid oral or hand contact with people with cold symptoms. Wash your hands often if contact occurs. There is no clear evidence that vitamin C, vitamin E, echinacea, or exercise reduces the chance of developing a cold. However, it is always recommended to get plenty of rest and practice good nutrition. TREATMENT  Treatment is directed at relieving symptoms. There is no cure. Antibiotics are not effective, because the infection is caused by a virus, not by bacteria. Treatment may include:  Increased fluid intake. Sports drinks offer valuable electrolytes, sugars, and fluids.  Breathing heated mist or steam (vaporizer or shower).  Eating chicken soup or other clear broths, and maintaining good nutrition.  Getting plenty of rest.  Using gargles or lozenges for comfort.  Controlling fevers with ibuprofen or acetaminophen as directed by your caregiver.  Increasing usage of your inhaler if you have asthma. Zinc gel and zinc lozenges, taken in the first 24 hours of the common cold, can shorten the duration and lessen the severity of symptoms. Pain medicines may help with fever, muscle aches, and throat pain. A variety of non-prescription medicines are available to treat congestion and runny nose. Your caregiver   can make recommendations and may suggest nasal or lung inhalers for other symptoms.  HOME CARE INSTRUCTIONS   Only take over-the-counter or prescription medicines for pain, discomfort, or fever as directed by your  caregiver.  Use a warm mist humidifier or inhale steam from a shower to increase air moisture. This may keep secretions moist and make it easier to breathe.  Drink enough water and fluids to keep your urine clear or pale yellow.  Rest as needed.  Return to work when your temperature has returned to normal or as your caregiver advises. You may need to stay home longer to avoid infecting others. You can also use a face mask and careful hand washing to prevent spread of the virus. SEEK MEDICAL CARE IF:   After the first few days, you feel you are getting worse rather than better.  You need your caregiver's advice about medicines to control symptoms.  You develop chills, worsening shortness of breath, or brown or red sputum. These may be signs of pneumonia.  You develop yellow or brown nasal discharge or pain in the face, especially when you bend forward. These may be signs of sinusitis.  You develop a fever, swollen neck glands, pain with swallowing, or white areas in the back of your throat. These may be signs of strep throat. SEEK IMMEDIATE MEDICAL CARE IF:   You have a fever.  You develop severe or persistent headache, ear pain, sinus pain, or chest pain.  You develop wheezing, a prolonged cough, cough up blood, or have a change in your usual mucus (if you have chronic lung disease).  You develop sore muscles or a stiff neck. Document Released: 02/25/2001 Document Revised: 11/24/2011 Document Reviewed: 12/07/2013 [employer] Patient Information 2015 [employer]. This information is not intended to replace advice given to you by your health care provider. Make sure you discuss any questions you have with your health care provider.  

## 2015-05-29 NOTE — ED Notes (Signed)
Pt to ED from home c/o sore throat, generalized body aches, and malaise for a couple days.  Pt states congestion of yellow drainage, runny nose, post nasal drip, whole body aches.  Reports nausea first couple days without vomiting, states decreased appetite, denies diarrhea.  Denies fevers at home.  Treating with muxinex and dayquil at home, last was dayquil today around 0900.  States kids at home have been sick.  Pt is A&Ox4, speaking in complete and coherent sentences and in NAD at this time.

## 2015-07-19 LAB — HM PAP SMEAR

## 2015-09-17 ENCOUNTER — Emergency Department: Payer: Medicaid Other

## 2015-09-17 ENCOUNTER — Emergency Department
Admission: EM | Admit: 2015-09-17 | Discharge: 2015-09-17 | Disposition: A | Discharge status: Home / Self Care | Payer: Medicaid Other | Attending: Emergency Medicine | Admitting: Emergency Medicine

## 2015-09-17 ENCOUNTER — Encounter: Payer: Self-pay | Admitting: Emergency Medicine

## 2015-09-17 DIAGNOSIS — I10 Essential (primary) hypertension: Secondary | ICD-10-CM | POA: Insufficient documentation

## 2015-09-17 DIAGNOSIS — K529 Noninfective gastroenteritis and colitis, unspecified: Secondary | ICD-10-CM | POA: Diagnosis not present

## 2015-09-17 DIAGNOSIS — R112 Nausea with vomiting, unspecified: Secondary | ICD-10-CM | POA: Diagnosis present

## 2015-09-17 DIAGNOSIS — Z3202 Encounter for pregnancy test, result negative: Secondary | ICD-10-CM | POA: Diagnosis not present

## 2015-09-17 DIAGNOSIS — Z79899 Other long term (current) drug therapy: Secondary | ICD-10-CM | POA: Insufficient documentation

## 2015-09-17 LAB — COMPREHENSIVE METABOLIC PANEL
ALT: 12 U/L — ABNORMAL LOW (ref 14–54)
ANION GAP: 6 (ref 5–15)
AST: 14 U/L — AB (ref 15–41)
Albumin: 4.4 g/dL (ref 3.5–5.0)
Alkaline Phosphatase: 54 U/L (ref 38–126)
BILIRUBIN TOTAL: 1.1 mg/dL (ref 0.3–1.2)
BUN: 13 mg/dL (ref 6–20)
CHLORIDE: 106 mmol/L (ref 101–111)
CO2: 27 mmol/L (ref 22–32)
Calcium: 9.2 mg/dL (ref 8.9–10.3)
Creatinine, Ser: 0.68 mg/dL (ref 0.44–1.00)
Glucose, Bld: 131 mg/dL — ABNORMAL HIGH (ref 65–99)
POTASSIUM: 4.1 mmol/L (ref 3.5–5.1)
Sodium: 139 mmol/L (ref 135–145)
TOTAL PROTEIN: 8.2 g/dL — AB (ref 6.5–8.1)

## 2015-09-17 LAB — URINALYSIS COMPLETE WITH MICROSCOPIC (ARMC ONLY)
BILIRUBIN URINE: NEGATIVE
GLUCOSE, UA: NEGATIVE mg/dL
HGB URINE DIPSTICK: NEGATIVE
Ketones, ur: NEGATIVE mg/dL
LEUKOCYTES UA: NEGATIVE
Nitrite: NEGATIVE
PH: 6 (ref 5.0–8.0)
Protein, ur: 30 mg/dL — AB
Specific Gravity, Urine: 1.03 (ref 1.005–1.030)

## 2015-09-17 LAB — CBC
HEMATOCRIT: 42.3 % (ref 35.0–47.0)
Hemoglobin: 13.8 g/dL (ref 12.0–16.0)
MCH: 27.5 pg (ref 26.0–34.0)
MCHC: 32.6 g/dL (ref 32.0–36.0)
MCV: 84.3 fL (ref 80.0–100.0)
Platelets: 217 10*3/uL (ref 150–440)
RBC: 5.02 MIL/uL (ref 3.80–5.20)
RDW: 13.7 % (ref 11.5–14.5)
WBC: 11 10*3/uL (ref 3.6–11.0)

## 2015-09-17 LAB — LIPASE, BLOOD: LIPASE: 25 U/L (ref 11–51)

## 2015-09-17 LAB — POCT PREGNANCY, URINE: Preg Test, Ur: NEGATIVE

## 2015-09-17 MED ORDER — POTASSIUM CHLORIDE CRYS ER 20 MEQ PO TBCR
EXTENDED_RELEASE_TABLET | ORAL | Status: AC
  Filled 2015-09-17: qty 1

## 2015-09-17 MED ORDER — ONDANSETRON HCL 4 MG/2ML IJ SOLN
4.0000 mg | Freq: Once | INTRAMUSCULAR | Status: AC
  Administered 2015-09-17: 4 mg via INTRAVENOUS
  Filled 2015-09-17: qty 2

## 2015-09-17 MED ORDER — SODIUM CHLORIDE 0.9 % IV SOLN
1000.0000 mL | Freq: Once | INTRAVENOUS | Status: AC
  Administered 2015-09-17: 1000 mL via INTRAVENOUS

## 2015-09-17 MED ORDER — ONDANSETRON HCL 4 MG PO TABS: 4.0000 mg | ORAL_TABLET | Freq: Every day | ORAL | Status: DC | PRN

## 2015-09-17 NOTE — ED Provider Notes (Signed)
-----------------------------------------   3:21 PM on 09/17/2015 -----------------------------------------   Blood pressure 112/69, pulse 90, temperature 98.7 F (37.1 C), temperature source Oral, resp. rate 18, height 5\' 7"  (1.702 m), weight 85.73 kg, SpO2 100 %.  Assuming care from Dr. Cyril LoosenKinner.  In short, Lisa Rowland is a 30 y.o. female with a chief complaint of Headache; Nausea; and Emesis .  Refer to the original H&P for additional details.  The current plan of care is to follow up head CT and dispo appropriate with N/V/D info.  ----------------------------------------- 4:21 PM on 09/17/2015 -----------------------------------------  Ct Head Wo Contrast  09/17/2015  CLINICAL DATA:  Patient presents the ED with nausea and vomiting and headache. This began yesterday. EXAM: CT HEAD WITHOUT CONTRAST TECHNIQUE: Contiguous axial images were obtained from the base of the skull through the vertex without intravenous contrast. COMPARISON:  None. FINDINGS: No evidence for acute infarction, hemorrhage, mass lesion, hydrocephalus, or extra-axial fluid. No atrophy or white matter disease. Intact calvarium. No acute sinus or mastoid disease. IMPRESSION: Negative exam. Electronically Signed   By: Elsie StainJohn T Curnes M.D.   On: 09/17/2015 16:11   She states that she feels better after getting IV fluids.  Still has some nausea but has been able to tolerate by mouth fluids.  She is ready to go home.  I gave my usual and customary return precautions.    New Prescriptions   ONDANSETRON (ZOFRAN) 4 MG TABLET    Take 1 tablet (4 mg total) by mouth daily as needed for nausea or vomiting.     [name], MD 09/17/15 1622

## 2015-09-17 NOTE — ED Notes (Signed)
Received report from Hunter, RN 

## 2015-09-17 NOTE — ED Notes (Signed)
Report to hunter rn

## 2015-09-17 NOTE — ED Notes (Signed)
Pt presents to ED with nausea, vomiting, and a headache with chills that started yesterday.  Pt states she feels weak and took naproxen without relief.

## 2015-09-17 NOTE — ED Provider Notes (Signed)
[facility] Emergency Department Provider Note  ____________________________________________  Time seen: 1:45 PM  I have reviewed the triage vital signs and the nursing notes.   HISTORY  Chief Complaint Nausea and vomiting and abdominal cramping   HPI Lisa Rowland is a 30 y.o. female who presents with complaints of nausea vomiting abdominal cramping that started 1 day ago. She is also had chills. She complains of intermittent headaches over the last week but reports her headache has resolved. She feels dehydrated and weak. She had one episode of loose stools today. No sick contacts. No recent travel.     Past Medical History  Diagnosis Date  . Scoliosis   . Bipolar 1 disorder (HCC)   . Hypotension   . Anemia   . Anxiety     There are no active problems to display for this patient.   Past Surgical History  Procedure Laterality Date  . Induced abortion      Current Outpatient Rx  Name  Route  Sig  Dispense  Refill  . azithromycin (ZITHROMAX Z-PAK) 250 MG tablet      Take 2 tablets (500 mg) on  Day 1,  followed by 1 tablet (250 mg) once daily on Days 2 through 5.   6 each   0   . clonazePAM (KLONOPIN) 0.5 MG tablet   Oral   Take 0.5 mg by mouth 3 (three) times daily.         Marland Kitchen. guaiFENesin-codeine 100-10 MG/5ML syrup   Oral   Take 10 mLs by mouth every 4 (four) hours as needed for cough.   180 mL   0   . ibuprofen (ADVIL,MOTRIN) 800 MG tablet   Oral   Take 1 tablet (800 mg total) by mouth every 8 (eight) hours as needed.   30 tablet   0     Allergies Bee venom; Mushroom extract complex; and Coconut oil  History reviewed. No pertinent family history.  Social History Social History  Substance Use Topics  . Smoking status: Never Smoker   . Smokeless tobacco: None  . Alcohol Use: No    Review of Systems  Constitutional: Negative for fever. Positive for chills Eyes: Negative for visual changes. ENT: Negative for sore  throat Cardiovascular: Negative for chest pain. Respiratory: Negative for shortness of breath. Gastrointestinal: As above Genitourinary: Negative for dysuria. Musculoskeletal: Negative for back pain. Skin: Negative for rash. Neurological: Negative for neck pain or focal weakness Psychiatric: No anxiety    ____________________________________________   PHYSICAL EXAM:  VITAL SIGNS: ED Triage Vitals  Enc Vitals Group     BP 09/17/15 1112 108/63 mmHg     Pulse Rate 09/17/15 1112 101     Resp 09/17/15 1112 18     Temp 09/17/15 1112 98.7 F (37.1 C)     Temp Source 09/17/15 1112 Oral     SpO2 09/17/15 1112 98 %     Weight 09/17/15 1112 189 lb (85.73 kg)     Height 09/17/15 1112 5\' 7"  (1.702 m)     Head Cir --      Peak Flow --      Pain Score --      Pain Loc --      Pain Edu? --      Excl. in GC? --      Constitutional: Alert and oriented. Well appearing and in no distress. Eyes: Conjunctivae are normal.  ENT   Head: Normocephalic and atraumatic. No meningismus  Mouth/Throat: Mucous membranes are moist. Cardiovascular: Normal rate, regular rhythm. Normal and symmetric distal pulses are present in all extremities. No murmurs, rubs, or gallops. Respiratory: Normal respiratory effort without tachypnea nor retractions. Breath sounds are clear and equal bilaterally.  Gastrointestinal: Soft and non-tender in all quadrants. No distention. There is no CVA tenderness. Genitourinary: deferred Musculoskeletal: Nontender with normal range of motion in all extremities. No lower extremity tenderness nor edema. Neurologic:  Normal speech and language. No gross focal neurologic deficits are appreciated. Skin:  Skin is warm, dry and intact. No rash noted. Psychiatric: Mood and affect are normal. Patient exhibits appropriate insight and judgment.  ____________________________________________    LABS (pertinent positives/negatives)  Labs Reviewed  COMPREHENSIVE METABOLIC  PANEL - Abnormal; Notable for the following:    Glucose, Bld 131 (*)    Total Protein 8.2 (*)    AST 14 (*)    ALT 12 (*)    All other components within normal limits  URINALYSIS COMPLETEWITH MICROSCOPIC ([facility] ONLY) - Abnormal; Notable for the following:    Color, Urine YELLOW (*)    APPearance CLEAR (*)    Protein, ur 30 (*)    Bacteria, UA RARE (*)    Squamous Epithelial / LPF 6-30 (*)    All other components within normal limits  LIPASE, BLOOD  CBC  POC URINE PREG, ED  POCT PREGNANCY, URINE    ____________________________________________   EKG  None  ____________________________________________    RADIOLOGY I have personally reviewed any xrays that were ordered on this patient: None  ____________________________________________   PROCEDURES  Procedure(s) performed: none  Critical Care performed: none  ____________________________________________   INITIAL IMPRESSION / ASSESSMENT AND PLAN / ED COURSE  Pertinent labs & imaging results that were available during my care of the patient were reviewed by me and considered in my medical decision making (see chart for details).  Patient well-appearing and in no acute distress. She presents with nausea vomiting and abdominal cramping. I'm suspecting acute gastritis versus gastroenteritis which is rampant in the community at this time. No neck pain or headache in the emergency department. No meningismus.  We'll treat with IV fluids and Zofran and reevaluate  ----------------------------------------- 3:25 PM on 09/17/2015 -----------------------------------------  Patient reports she is feeling significantly better although continues to have mild nausea. We will treat with an additional dose of IV Zofran. She has significant concern about intermittent headaches over the last week and requests imaging, we will obtain CT head although low suspicion for any abnormalities. Patient afebrile, no meningismus, presentation  most consistent with viral gastroenteritis.  I was asked Dr. York Cerise to follow-up on the CT and reevaluate the patient. As long as the CT head is normal I anticipate discharge  ____________________________________________   FINAL CLINICAL IMPRESSION(S) / ED DIAGNOSES  Final diagnoses:  Gastroenteritis      Jene Every, MD 09/17/15 1530

## 2015-09-17 NOTE — ED Notes (Signed)
Patient transported to CT 

## 2015-11-15 ENCOUNTER — Ambulatory Visit (INDEPENDENT_AMBULATORY_CARE_PROVIDER_SITE_OTHER): Payer: Medicaid Other | Admitting: Obstetrics and Gynecology

## 2015-11-15 ENCOUNTER — Encounter: Payer: Self-pay | Admitting: Obstetrics and Gynecology

## 2015-11-15 VITALS — BP 101/67 | HR 74 | Ht 67.0 in | Wt 190.5 lb

## 2015-11-15 DIAGNOSIS — R87610 Atypical squamous cells of undetermined significance on cytologic smear of cervix (ASC-US): Secondary | ICD-10-CM

## 2015-11-15 DIAGNOSIS — Z139 Encounter for screening, unspecified: Secondary | ICD-10-CM | POA: Diagnosis not present

## 2015-11-15 DIAGNOSIS — R87810 Cervical high risk human papillomavirus (HPV) DNA test positive: Secondary | ICD-10-CM | POA: Diagnosis not present

## 2015-11-15 LAB — POCT URINE PREGNANCY: PREG TEST UR: NEGATIVE

## 2015-11-15 NOTE — Progress Notes (Signed)
GYN ENCOUNTER NOTE  Subjective:       Lisa Rowland is a 30 y.o. [phone] female is here for gynecologic evaluation of the following issues:  1. ASCUS/positive Pap smear.     Nonsmoker 1 partner in the past year Remote history of abnormal Pap smear requiring biopsy; patient does not recall any treatment such as cryosurgery or LEEP Remote history of chlamydia infection  Gynecologic History No LMP recorded. Patient has had an injection.   Obstetric History OB History  Gravida Para Term Preterm AB SAB TAB Ectopic Multiple Living  0 2 0 2 0 0 3    # Outcome Date GA Lbr Len/2nd Weight Sex Delivery Anes PTL Lv  5 Term         Y  4 Term         Y  3 Term         Y  2 TAB         FD  1 TAB         FD      Past Medical History  Diagnosis Date  . Scoliosis   . Bipolar 1 disorder (HCC)   . Hypotension   . Anemia   . Anxiety   . ASCUS favor benign   . HPV in female     Past Surgical History  Procedure Laterality Date  . Induced abortion      Current Outpatient Prescriptions on File Prior to Visit  Medication Sig Dispense Refill  . clonazePAM (KLONOPIN) 0.5 MG tablet Take 0.5 mg by mouth 3 (three) times daily.    Marland Kitchen ibuprofen (ADVIL,MOTRIN) 800 MG tablet Take 1 tablet (800 mg total) by mouth every 8 (eight) hours as needed. 30 tablet 0   No current facility-administered medications on file prior to visit.    Allergies  Allergen Reactions  . Bee Venom Swelling  . Mushroom Extract Complex Swelling  . Coconut Oil     Social History   Social History  . Marital Status: Single    Spouse Name: N/A  . Number of Children: N/A  . Years of Education: N/A   Occupational History  . Not on file.   Social History Main Topics  . Smoking status: Never Smoker   . Smokeless tobacco: Not on file  . Alcohol Use: No  . Drug Use: No  . Sexual Activity: Yes    Birth Control/ Protection: Other-see comments   Other Topics Concern  . Not on file   Social History  Narrative    Family History  Problem Relation Age of Onset  . Osteoarthritis Mother   . Osteoarthritis Father   . Osteoarthritis Maternal Grandmother   . Osteoarthritis Paternal Grandmother   . Asthma Maternal Grandmother   . Asthma Paternal Grandmother   . Asthma Mother   . Cancer Maternal Grandmother     breast cancer  . Cancer Paternal Grandmother     stomach  . Breast cancer Paternal Aunt   . Diabetes Maternal Grandmother   . Diabetes Paternal Grandmother   . Heart failure Maternal Grandmother   . Heart failure Paternal Grandmother   . Hyperlipidemia Maternal Grandmother   . Hyperlipidemia Paternal Grandmother   . Hypertension Maternal Grandmother   . Hypertension Paternal Grandmother   . Hypertension Mother   . Hypertension Father   . Hypertension Sister   . Migraines Maternal Grandmother   . Thyroid disease Maternal Grandmother     The following portions of  the patient's history were reviewed and updated as appropriate: allergies, current medications, past family history, past medical history, past social history, past surgical history and problem list.  Review of Systems Review of Systems - General ROS: negative for - chills, fatigue, fever, hot flashes, malaise or night sweats Hematological and Lymphatic ROS: negative for - bleeding problems or swollen lymph nodes Gastrointestinal ROS: negative for - abdominal pain, blood in stools, change in bowel habits and nausea/vomiting Musculoskeletal ROS: negative for - joint pain, muscle pain or muscular weakness Genito-Urinary ROS: negative for - change in menstrual cycle, dysmenorrhea, dyspareunia, dysuria, genital discharge, genital ulcers, hematuria, incontinence, irregular/heavy menses, nocturia or pelvic painjj  Objective:   BP 101/67 mmHg  Pulse 74  Ht  (1.702 m)  Wt 190 lb 8 oz (86.41 kg)  BMI 29.83 kg/m2  LMP  CONSTITUTIONAL: Well-developed, well-nourished female in no acute distress.  HENT:   Normocephalic, atraumatic.  NECK: Not examined SKIN: Skin is warm and dry. No rash noted. Not diaphoretic. No erythema. No pallor. NEUROLGIC: Alert and oriented to person, place, and time. PSYCHIATRIC: Normal mood and affect. Normal behavior. CARDIOVASCULAR:Not Examined RESPIRATORY: Not Examined BREASTS: Not Examined ABDOMEN: Soft, non distended; Non tender.  No Organomegaly. PELVIC:  External Genitalia: Normal  BUS: Normal  Vagina: Normal  Cervix: Normal; no lesions  Uterus: Not examined  Adnexa: Not examined  RV: Normal external exam  Bladder: Not examined MUSCULOSKELETAL: Not examined  PROCEDURE: Colposcopy of upper adjacent vagina and cervix with ECC Indications: ASCUS/positive HPV Pap smear Findings: Squamocolumnar junction was not visualized; no ectocervical lesions; normal vagina Biopsies: ECC  Description: Patient was placed in the dorsal lithotomy position. Consent is obtained. Speculum was placed in the vagina. The cervix and vagina are swabbed with 6% acetic acid. Colposcopy is performed with the above findings being noted. ECC is performed with a serrated curette. Minimal blood loss is encountered. Procedure was well-tolerated.       Assessment:   1. Screening procedure - POCT urine pregnancy   2. ASCUS/positive HPV Pap smear  3. Inadequate colposcopy (SCJ was not visualized); no ectocervical or vaginal lesions  Plan:   1. Colposcopy of upper adjacent vagina and cervix with ECC was performed 2.  ECC 3. Return in 6 months for Pap smear 4.  Results will be given over telephone.

## 2015-11-15 NOTE — Patient Instructions (Signed)
1. You will be notified by phone - biopsy results 2. Return in 6 months for Pap smear

## 2015-11-15 NOTE — Addendum Note (Signed)
Addended by: Jackquline Denmark on: 11/15/2015 12:40 PM   Modules accepted: Orders

## 2015-11-19 LAB — PATHOLOGY

## 2015-11-24 ENCOUNTER — Emergency Department: Payer: Medicaid Other

## 2015-11-24 ENCOUNTER — Emergency Department
Admission: EM | Admit: 2015-11-24 | Discharge: 2015-11-24 | Disposition: A | Discharge status: Home / Self Care | Payer: Medicaid Other | Attending: Emergency Medicine | Admitting: Emergency Medicine

## 2015-11-24 DIAGNOSIS — M5489 Other dorsalgia: Secondary | ICD-10-CM | POA: Diagnosis present

## 2015-11-24 DIAGNOSIS — F419 Anxiety disorder, unspecified: Secondary | ICD-10-CM | POA: Insufficient documentation

## 2015-11-24 DIAGNOSIS — F319 Bipolar disorder, unspecified: Secondary | ICD-10-CM | POA: Diagnosis not present

## 2015-11-24 DIAGNOSIS — M419 Scoliosis, unspecified: Secondary | ICD-10-CM | POA: Insufficient documentation

## 2015-11-24 DIAGNOSIS — I959 Hypotension, unspecified: Secondary | ICD-10-CM | POA: Insufficient documentation

## 2015-11-24 DIAGNOSIS — Z79899 Other long term (current) drug therapy: Secondary | ICD-10-CM | POA: Diagnosis not present

## 2015-11-24 DIAGNOSIS — M62830 Muscle spasm of back: Secondary | ICD-10-CM | POA: Insufficient documentation

## 2015-11-24 LAB — FIBRIN DERIVATIVES D-DIMER (ARMC ONLY): FIBRIN DERIVATIVES D-DIMER (ARMC): 122 (ref 0–499)

## 2015-11-24 LAB — BASIC METABOLIC PANEL
Anion gap: 7 (ref 5–15)
BUN: 11 mg/dL (ref 6–20)
CHLORIDE: 103 mmol/L (ref 101–111)
CO2: 25 mmol/L (ref 22–32)
Calcium: 9 mg/dL (ref 8.9–10.3)
Creatinine, Ser: 0.77 mg/dL (ref 0.44–1.00)
GFR calc Af Amer: >60 mL/min (ref >60)
GFR calc non Af Amer: >60 mL/min (ref >60)
GLUCOSE: 84 mg/dL (ref 65–99)
POTASSIUM: 4 mmol/L (ref 3.5–5.1)
Sodium: 135 mmol/L (ref 135–145)

## 2015-11-24 LAB — CBC WITH DIFFERENTIAL/PLATELET
Basophils Absolute: 0.1 10*3/uL (ref 0–0.1)
Basophils Relative: 2 %
Eosinophils Absolute: 0.2 10*3/uL (ref 0–0.7)
Eosinophils Relative: 4 %
HEMATOCRIT: 37.8 % (ref 35.0–47.0)
HEMOGLOBIN: 12.4 g/dL (ref 12.0–16.0)
LYMPHS ABS: 1.6 10*3/uL (ref 1.0–3.6)
LYMPHS PCT: 27 %
MCH: 27.4 pg (ref 26.0–34.0)
MCHC: 32.7 g/dL (ref 32.0–36.0)
MCV: 83.6 fL (ref 80.0–100.0)
Monocytes Absolute: 0.3 10*3/uL (ref 0.2–0.9)
Monocytes Relative: 6 %
NEUTROS ABS: 3.6 10*3/uL (ref 1.4–6.5)
NEUTROS PCT: 61 %
Platelets: 251 10*3/uL (ref 150–440)
RBC: 4.52 MIL/uL (ref 3.80–5.20)
RDW: 13.6 % (ref 11.5–14.5)
WBC: 5.9 10*3/uL (ref 3.6–11.0)

## 2015-11-24 MED ORDER — HYDROMORPHONE HCL 2 MG PO TABS
2.0000 mg | ORAL_TABLET | Freq: Two times a day (BID) | ORAL | Status: DC | PRN
Start: 2015-11-24 — End: 2016-02-27

## 2015-11-24 MED ORDER — HYDROMORPHONE HCL 1 MG/ML IJ SOLN
0.5000 mg | Freq: Once | INTRAMUSCULAR | Status: AC
  Administered 2015-11-24: 0.5 mg via INTRAVENOUS
  Filled 2015-11-24: qty 1

## 2015-11-24 MED ORDER — LORAZEPAM 1 MG PO TABS: 1.0000 mg | ORAL_TABLET | Freq: Two times a day (BID) | ORAL | Status: DC | PRN

## 2015-11-24 MED ORDER — LORAZEPAM 2 MG/ML IJ SOLN
1.0000 mg | Freq: Once | INTRAMUSCULAR | Status: AC
  Administered 2015-11-24: 1 mg via INTRAVENOUS
  Filled 2015-11-24: qty 1

## 2015-11-24 MED ORDER — KETOROLAC TROMETHAMINE 30 MG/ML IJ SOLN
30.0000 mg | Freq: Once | INTRAMUSCULAR | Status: AC
  Administered 2015-11-24: 30 mg via INTRAVENOUS
  Filled 2015-11-24: qty 1

## 2015-11-24 NOTE — ED Notes (Addendum)
Upper Back pain for 1 week. Chest pain that started this morning  that is worse with movement and take a deep breath. Denies recent cold symptoms.

## 2015-11-24 NOTE — ED Notes (Signed)
Patient transported to X-ray 

## 2015-11-24 NOTE — ED Provider Notes (Signed)
Time Seen: Approximately 0 900  I have reviewed the triage notes  Chief Complaint: Chest Pain   History of Present Illness: Lisa Rowland is a 30 y.o. female who presents with left-sided upper back pain. Patient states she has history of scoliosis and is followed by orthopedic surgeon. She states that she has chronic low back pain but this pain seemed to be different and is located in in the upper back region. She states it's left of midline. She's noticed that throughout the week and seemed to tolerate it okay but this morning the pain was very intense. She states that it hurts with movement and deep inspiration. She has had a cough is been dry and nonproductive in nature. She denies any fever or weakness. She denies any anterior chest pain to this historian. She denies any neck or head discomfort   Past Medical History  Diagnosis Date  . Scoliosis   . Bipolar 1 disorder (HCC)   . Hypotension   . Anemia   . Anxiety   . ASCUS favor benign   . HPV in female     Patient Active Problem List   Diagnosis Date Noted  . Headache, migraine 01/23/2014  . 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 Clinical trial participant 11/25/2013  . Opioid dependence (HCC) 10/27/2013  . Myofascial pain 10/10/2013  . Continuous opioid dependence (HCC) 09/22/2013  . Bipolar affective disorder (HCC) 08/19/2013  . Scoliosis 08/19/2013  . Lesion of vulva 08/02/2013  . HPV test positive 06/08/2013  . Cervical pain 04/03/2012    Past Surgical History  Procedure Laterality Date  . Induced abortion      Past Surgical History  Procedure Laterality Date  . Induced abortion      Current Outpatient Rx  Name  Route  Sig  Dispense  Refill  . albuterol (PROVENTIL HFA;VENTOLIN HFA) 108 (90 Base) MCG/ACT inhaler   Inhalation   Inhale into the lungs.         . clonazePAM (KLONOPIN) 0.5 MG tablet   Oral   Take 0.5 mg by mouth 3 (three) times daily.         Marland Kitchen ibuprofen (ADVIL,MOTRIN) 800 MG  tablet   Oral   Take 1 tablet (800 mg total) by mouth every 8 (eight) hours as needed.   30 tablet   0   . lamoTRIgine (LAMICTAL) 150 MG tablet   Oral   Take 150 mg by mouth.         . oxyCODONE-acetaminophen (PERCOCET/ROXICET) 5-325 MG tablet   Oral   Take by mouth.         . QUEtiapine (SEROQUEL) 300 MG tablet   Oral   Take 300 mg by mouth.           Allergies:  Amoxicillin; Bee venom; Mushroom extract complex; and Coconut oil  Family History: Family History  Problem Relation Age of Onset  . Osteoarthritis Mother   . Osteoarthritis Father   . Osteoarthritis Maternal Grandmother   . Osteoarthritis Paternal Grandmother   . Asthma Maternal Grandmother   . Asthma Paternal Grandmother   . Asthma Mother   . Cancer Maternal Grandmother     breast cancer  . Cancer Paternal Grandmother     stomach  . Breast cancer Paternal Aunt   . Diabetes Maternal Grandmother   . Diabetes Paternal Grandmother   . Heart failure Maternal Grandmother   . Heart failure Paternal Grandmother   . Hyperlipidemia Maternal Grandmother   . Hyperlipidemia  Paternal Grandmother   . Hypertension Maternal Grandmother   . Hypertension Paternal Grandmother   . Hypertension Mother   . Hypertension Father   . Hypertension Sister   . Migraines Maternal Grandmother   . Thyroid disease Maternal Grandmother     Social History: Social History  Substance Use Topics  . Smoking status: Never Smoker   . Smokeless tobacco: Not on file  . Alcohol Use: No     Review of Systems:   10 point review of systems was performed and was otherwise negative:  Constitutional: No fever Eyes: No visual disturbances ENT: No sore throat, ear pain Cardiac: No chest pain Respiratory: No shortness of breath, wheezing, or stridor Abdomen: No abdominal pain, no vomiting, No diarrhea Endocrine: No weight loss, No night sweats Extremities: She states some swelling in her lower extremities and describes a "" did  not "" in her right lower extremity she points posterior to the right knee Skin: No rashes, easy bruising Neurologic: No focal weakness, trouble with speech or swollowing Urologic: No dysuria, Hematuria, or urinary frequency   Physical Exam:  ED Triage Vitals  Enc Vitals Group     BP 11/24/15 0838 108/64 mmHg     Pulse Rate 11/24/15 0838 72     Resp 11/24/15 0838 18     Temp 11/24/15 0838 98.3 F (36.8 C)     Temp Source 11/24/15 0838 Oral     SpO2 11/24/15 0838 99 %     Weight 11/24/15 0838 198 lb (89.812 kg)     Height 11/24/15 0838 5\' 7"  (1.702 m)     Head Cir --      Peak Flow --      Pain Score 11/24/15 0838 10     Pain Loc --      Pain Edu? --      Excl. in GC? --     General: Awake , Alert , and Oriented times 3; GCS 15 Head: Normal cephalic , atraumatic Eyes: Pupils equal , round, reactive to light Nose/Throat: No nasal drainage, patent upper airway without erythema or exudate.  Neck: Supple, Full range of motion, No anterior adenopathy or palpable thyroid masses Lungs: Clear to ascultation without wheezes , rhonchi, or rales Heart: Regular rate, regular rhythm without murmurs , gallops , or rubs Abdomen: Soft, non tender without rebound, guarding , or rigidity; bowel sounds positive and symmetric in all 4 quadrants. No organomegaly .        Extremities: 2 plus symmetric pulses. No edema, clubbing or cyanosis. No palpable pain discomfort etc. Neurologic: normal ambulation, Motor symmetric without deficits, sensory intact Skin: warm, dry, no rashes Pain is reproducible with palpation across the posterior left scapular region. Palpation here does totally reproduce the patient's discomfort.  Labs:   All laboratory work was reviewed including any pertinent negatives or positives listed below:  Labs Reviewed  CBC WITH DIFFERENTIAL/PLATELET  BASIC METABOLIC PANEL  FIBRIN DERIVATIVES D-DIMER ([facility] ONLY)  Reviewed the patient's laboratory work was negative especially  D-dimer test  EKG:  ED ECG REPORT I, Jennye MoccasinBrian S Quigley, the attending physician, personally viewed and interpreted this ECG.  Date: 11/24/2015 EKG Time: 0843 Rate: 75 Rhythm: normal sinus rhythm QRS Axis: normal Intervals: normal ST/T Wave abnormalities: normal Conduction Disturbances: none Narrative Interpretation: unremarkable No acute ischemic changes   Radiology:  CLINICAL DATA: Low back pain for 1 week. Left-sided chest pain.  EXAM: CHEST - 2 VIEW  COMPARISON: Two-view chest x-ray 10/06/2013.  FINDINGS: The  heart size and mediastinal contours are within normal limits. Both lungs are clear. The visualized skeletal structures are unremarkable.  IMPRESSION: Negative two view chest x-ray   Electronically Signed By: Marin Roberts M.D. On: 11/24/2015 09:57    I personally reviewed the radiologic studies     ED Course:  Patient's stay here was uneventful and she had symptomatic relief with Toradol, Ativan, and low-dose Dilaudid. Patient will be discharged on similar medications. Given her clinical presentation and objective findings I felt this most likely was musculoskeletal pain with very reproducible posterior discomfort especially around the left scapular area. Patient was advised follow-up with her primary physician. I felt with the negative D-dimer test we could stop the investigation for deep venous thrombosis or pulmonary embolism.    Assessment:  Musculoskeletal upper back pain      Plan:  Outpatient management Patient was advised to return immediately if condition worsens. Patient was advised to follow up with their primary care physician or other specialized physicians involved in their outpatient care             Jennye Moccasin, MD 11/24/15 1124

## 2015-11-24 NOTE — ED Notes (Signed)
Pt's sister Carlyle Lipa([place]) [phone].

## 2015-11-24 NOTE — ED Notes (Signed)
Discussed discharge instructions, prescriptions, and follow-up care with patient and sister, with pt's permission. No questions or concerns at this time. Pt stable at discharge.

## 2015-11-24 NOTE — Discharge Instructions (Signed)
Heat Therapy Heat therapy can help ease sore, stiff, injured, and tight muscles and joints. Heat relaxes your muscles, which may help ease your pain.  RISKS AND COMPLICATIONS If you have any of the following conditions, do not use heat therapy unless your health care provider has approved:  Poor circulation.  Healing wounds or scarred skin in the area being treated.  Diabetes, heart disease, or high blood pressure.  Not being able to feel (numbness) the area being treated.  Unusual swelling of the area being treated.  Active infections.  Blood clots.  Cancer.  Inability to communicate pain. This may include young children and people who have problems with their brain function (dementia).  Pregnancy. Heat therapy should only be used on old, pre-existing, or long-lasting (chronic) injuries. Do not use heat therapy on new injuries unless directed by your health care provider. HOW TO USE HEAT THERAPY There are several different kinds of heat therapy, including:  Moist heat pack.  Warm water bath.  Hot water bottle.  Electric heating pad.  Heated gel pack.  Heated wrap.  Electric heating pad. Use the heat therapy method suggested by your health care provider. Follow your health care provider's instructions on when and how to use heat therapy. GENERAL HEAT THERAPY RECOMMENDATIONS  Do not sleep while using heat therapy. Only use heat therapy while you are awake.  Your skin may turn pink while using heat therapy. Do not use heat therapy if your skin turns red.  Do not use heat therapy if you have new pain.  High heat or long exposure to heat can cause burns. Be careful when using heat therapy to avoid burning your skin.  Do not use heat therapy on areas of your skin that are already irritated, such as with a rash or sunburn. SEEK MEDICAL CARE IF:  You have blisters, redness, swelling, or numbness.  You have new pain.  Your pain is worse. MAKE SURE  YOU:  Understand these instructions.  Will watch your condition.  Will get help right away if you are not doing well or get worse.   This information is not intended to replace advice given to you by your health care provider. Make sure you discuss any questions you have with your health care provider.   Document Released: 11/24/2011 Document Revised: 09/22/2014 Document Reviewed: 10/25/2013 Elsevier Interactive Patient Education [employer]  Please return immediately if condition worsens. Please contact her primary physician or the physician you were given for referral. If you have any specialist physicians involved in her treatment and plan please also contact them. Thank you for using [facility].  Muscle Cramps and Spasms Muscle cramps and spasms occur when a muscle or muscles tighten and you have no control over this tightening (involuntary muscle contraction). They are a common problem and can develop in any muscle. The most common place is in the calf muscles of the leg. Both muscle cramps and muscle spasms are involuntary muscle contractions, but they also have differences:   Muscle cramps are sporadic and painful. They may last a few seconds to a quarter of an hour. Muscle cramps are often more forceful and last longer than muscle spasms.  Muscle spasms may or may not be painful. They may also last just a few seconds or much longer. CAUSES  It is uncommon for cramps or spasms to be due to a serious underlying problem. In many cases, the cause of cramps or spasms is unknown. Some common causes  are:   Overexertion.   Overuse from repetitive motions (doing the same thing over and over).   Remaining in a certain position for a long period of time.   Improper preparation, form, or technique while performing a sport or activity.   Dehydration.   Injury.   Side effects of some medicines.   Abnormally low levels of the salts and ions in  your blood (electrolytes), especially potassium and calcium. This could happen if you are taking water pills (diuretics) or you are pregnant.  Some underlying medical problems can make it more likely to develop cramps or spasms. These include, but are not limited to:   Diabetes.   Parkinson disease.   Hormone disorders, such as thyroid problems.   Alcohol abuse.   Diseases specific to muscles, joints, and bones.   Blood vessel disease where not enough blood is getting to the muscles.  HOME CARE INSTRUCTIONS   Stay well hydrated. Drink enough water and fluids to keep your urine clear or pale yellow.  It may be helpful to massage, stretch, and relax the affected muscle.  For tight or tense muscles, use a warm towel, heating pad, or hot shower water directed to the affected area.  If you are sore or have pain after a cramp or spasm, applying ice to the affected area may relieve discomfort.  Put ice in a plastic bag.  Place a towel between your skin and the bag.  Leave the ice on for 15-20 minutes, 03-04 times a day.  Medicines used to treat a known cause of cramps or spasms may help reduce their frequency or severity. Only take over-the-counter or prescription medicines as directed by your caregiver. SEEK MEDICAL CARE IF:  Your cramps or spasms get more severe, more frequent, or do not improve over time.  MAKE SURE YOU:   Understand these instructions.  Will watch your condition.  Will get help right away if you are not doing well or get worse.   This information is not intended to replace advice given to you by your health care provider. Make sure you discuss any questions you have with your health care provider.   Document Released: 02/21/2002 Document Revised: 12/27/2012 Document Reviewed: 08/18/2012 Elsevier Interactive Patient Education [employer] Elsevier Inc.

## 2015-12-05 ENCOUNTER — Encounter: Payer: Self-pay | Admitting: Obstetrics and Gynecology

## 2016-01-25 ENCOUNTER — Telehealth: Payer: Self-pay | Admitting: *Deleted

## 2016-01-25 NOTE — Telephone Encounter (Signed)
sw pt informed her that Dr. Starling MannsParris needs this appt for 02/01/16 @ 11am to be r/s gave appt for 03/07/16 @ 8:30am...td

## 2016-02-01 ENCOUNTER — Ambulatory Visit: Payer: Medicaid Other | Admitting: Anesthesiology

## 2016-02-01 ENCOUNTER — Telehealth: Payer: Self-pay | Admitting: *Deleted

## 2016-02-01 NOTE — Telephone Encounter (Signed)
lm on vm making the pt aware that her appt for 03/07/16 was cancelled due to dr. parris no longer taking new pts. i made her awaer that her referral will go to a different doctor for review...TD 

## 2016-02-07 ENCOUNTER — Encounter: Payer: Self-pay | Admitting: Emergency Medicine

## 2016-02-07 ENCOUNTER — Emergency Department
Admission: EM | Admit: 2016-02-07 | Discharge: 2016-02-07 | Disposition: A | Discharge status: Home / Self Care | Payer: Medicaid Other | Attending: Emergency Medicine | Admitting: Emergency Medicine

## 2016-02-07 DIAGNOSIS — Y929 Unspecified place or not applicable: Secondary | ICD-10-CM | POA: Insufficient documentation

## 2016-02-07 DIAGNOSIS — Y999 Unspecified external cause status: Secondary | ICD-10-CM | POA: Insufficient documentation

## 2016-02-07 DIAGNOSIS — Y939 Activity, unspecified: Secondary | ICD-10-CM | POA: Insufficient documentation

## 2016-02-07 DIAGNOSIS — S46912A Strain of unspecified muscle, fascia and tendon at shoulder and upper arm level, left arm, initial encounter: Secondary | ICD-10-CM | POA: Diagnosis not present

## 2016-02-07 DIAGNOSIS — S46912D Strain of unspecified muscle, fascia and tendon at shoulder and upper arm level, left arm, subsequent encounter: Secondary | ICD-10-CM

## 2016-02-07 DIAGNOSIS — F319 Bipolar disorder, unspecified: Secondary | ICD-10-CM | POA: Insufficient documentation

## 2016-02-07 DIAGNOSIS — X58XXXA Exposure to other specified factors, initial encounter: Secondary | ICD-10-CM | POA: Diagnosis not present

## 2016-02-07 DIAGNOSIS — S4992XA Unspecified injury of left shoulder and upper arm, initial encounter: Secondary | ICD-10-CM | POA: Diagnosis present

## 2016-02-07 DIAGNOSIS — J9801 Acute bronchospasm: Secondary | ICD-10-CM | POA: Diagnosis not present

## 2016-02-07 DIAGNOSIS — Z79899 Other long term (current) drug therapy: Secondary | ICD-10-CM | POA: Diagnosis not present

## 2016-02-07 DIAGNOSIS — M2550 Pain in unspecified joint: Secondary | ICD-10-CM

## 2016-02-07 MED ORDER — METHOCARBAMOL 750 MG PO TABS: 750.0000 mg | ORAL_TABLET | Freq: Two times a day (BID) | ORAL | Status: DC

## 2016-02-07 MED ORDER — OXYCODONE-ACETAMINOPHEN 10-325 MG PO TABS: 1.0000 | ORAL_TABLET | Freq: Three times a day (TID) | ORAL | Status: DC

## 2016-02-07 MED ORDER — NAPROXEN 500 MG PO TABS
500.0000 mg | ORAL_TABLET | Freq: Once | ORAL | Status: AC
  Administered 2016-02-07: 500 mg via ORAL
  Filled 2016-02-07: qty 1

## 2016-02-07 MED ORDER — METHOCARBAMOL 500 MG PO TABS
1000.0000 mg | ORAL_TABLET | Freq: Once | ORAL | Status: AC
  Administered 2016-02-07: 1000 mg via ORAL
  Filled 2016-02-07: qty 2

## 2016-02-07 MED ORDER — OXYCODONE-ACETAMINOPHEN 5-325 MG PO TABS
1.0000 | ORAL_TABLET | Freq: Once | ORAL | Status: AC
  Administered 2016-02-07: 1 via ORAL
  Filled 2016-02-07: qty 1

## 2016-02-07 MED ORDER — PSEUDOEPH-BROMPHEN-DM 30-2-10 MG/5ML PO SYRP: 5.0000 mL | ORAL_SOLUTION | Freq: Four times a day (QID) | ORAL | Status: DC | PRN

## 2016-02-07 MED ORDER — NAPROXEN 500 MG PO TABS: 500.0000 mg | ORAL_TABLET | Freq: Two times a day (BID) | ORAL | Status: DC

## 2016-02-07 NOTE — ED Provider Notes (Signed)
[facility] Emergency Department Provider Note   ____________________________________________  Time seen: Approximately 10:15 PM  I have reviewed the triage vital signs and the nursing notes.   HISTORY  Chief Complaint Muscle Pain    HPI Lisa Rowland is a 30 y.o. female patient complaining of left shoulder pain, right knee pain, and coughing. Patient states she's had chronic left shoulder pain and has been out of medication for 2 months secondary to lack of insurance. Patient her Medicaid has been reinstated. Patient also complaining her right leg pain with some mild edema. Patient states she does show she bitten by an insect. Patient states pain increase ambulation. Patient also complaining of coughing with productive yellow sputum for 2 days. Patient believe this is secondary to being around family members smoke. Patient stated she is on none smoking environment she is not coughing. Patient state she is unable to completely avoid her family members who reside with her that smoke. Patient denies any fever, patient denies any chest pain, patient denies any loss sensation of the upper or lower extremity. No palliative measures for her complaints.Patient rates the pain as 8/10. Patient described the pain in her left shoulder as "spasmatic" with movements. Patient described her knee pain as "achy".   Past Medical History  Diagnosis Date  . Scoliosis   . Bipolar 1 disorder (HCC)   . Hypotension   . Anemia   . Anxiety   . ASCUS favor benign   . HPV in female     Patient Active Problem List   Diagnosis Date Noted  . Headache, migraine 01/23/2014  . 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 Clinical trial participant 11/25/2013  . Opioid dependence (HCC) 10/27/2013  . Myofascial pain 10/10/2013  . Continuous opioid dependence (HCC) 09/22/2013  . Bipolar affective disorder (HCC) 08/19/2013  . Scoliosis 08/19/2013  . Lesion of vulva 08/02/2013  . HPV test  positive 06/08/2013  . Cervical pain 04/03/2012    Past Surgical History  Procedure Laterality Date  . Induced abortion      Current Outpatient Rx  Name  Route  Sig  Dispense  Refill  . albuterol (PROVENTIL HFA;VENTOLIN HFA) 108 (90 Base) MCG/ACT inhaler   Inhalation   Inhale 2 puffs into the lungs every 6 (six) hours as needed.          . brompheniramine-pseudoephedrine-DM 30-2-10 MG/5ML syrup   Oral   Take 5 mLs by mouth 4 (four) times daily as needed.   120 mL   0   . clonazePAM (KLONOPIN) 0.5 MG tablet   Oral   Take 0.5 mg by mouth 2 (two) times daily.          Marland Kitchen HYDROmorphone (DILAUDID) 2 MG tablet   Oral   Take 1 tablet (2 mg total) by mouth every 12 (twelve) hours as needed for severe pain.   10 tablet   0   . lamoTRIgine (LAMICTAL) 100 MG tablet   Oral   Take 100 mg by mouth 2 (two) times daily.         Marland Kitchen LORazepam (ATIVAN) 1 MG tablet   Oral   Take 1 tablet (1 mg total) by mouth 2 (two) times daily as needed (muscle spasm).   10 tablet   0   . methocarbamol (ROBAXIN) 750 MG tablet   Oral   Take 1 tablet (750 mg total) by mouth 2 (two) times daily.   20 tablet   0   . naproxen (NAPROSYN)  500 MG tablet   Oral   Take 1 tablet (500 mg total) by mouth 2 (two) times daily with a meal.   20 tablet   0   . oxyCODONE-acetaminophen (PERCOCET) 10-325 MG tablet   Oral   Take 1 tablet by mouth 3 (three) times daily.   15 tablet   0   . QUEtiapine (SEROQUEL XR) 300 MG 24 hr tablet   Oral   Take 300 mg by mouth at bedtime.           Allergies Amoxicillin; Bee venom; Mushroom extract complex; and Coconut oil  Family History  Problem Relation Age of Onset  . Osteoarthritis Mother   . Osteoarthritis Father   . Osteoarthritis Maternal Grandmother   . Osteoarthritis Paternal Grandmother   . Asthma Maternal Grandmother   . Asthma Paternal Grandmother   . Asthma Mother   . Cancer Maternal Grandmother     breast cancer  . Cancer Paternal  Grandmother     stomach  . Breast cancer Paternal Aunt   . Diabetes Maternal Grandmother   . Diabetes Paternal Grandmother   . Heart failure Maternal Grandmother   . Heart failure Paternal Grandmother   . Hyperlipidemia Maternal Grandmother   . Hyperlipidemia Paternal Grandmother   . Hypertension Maternal Grandmother   . Hypertension Paternal Grandmother   . Hypertension Mother   . Hypertension Father   . Hypertension Sister   . Migraines Maternal Grandmother   . Thyroid disease Maternal Grandmother     Social History Social History  Substance Use Topics  . Smoking status: Never Smoker   . Smokeless tobacco: None  . Alcohol Use: No    Review of Systems Constitutional: No fever/chills Eyes: No visual changes. ENT: No sore throat. Cardiovascular: Denies chest pain. Respiratory: Denies shortness of breath. Gastrointestinal: No abdominal pain.  No nausea, no vomiting.  No diarrhea.  No constipation. Genitourinary: Negative for dysuria. Musculoskeletal: Negative for back pain. Skin: Negative for rash. Neurological: Negative for headaches, focal weakness or numbness. Psychiatric:Bipolar and anxiety. Endocrine:Hypertension Hematological/Lymphatic:Anemia Allergic/Immunilogical: See medication list   ____________________________________________   PHYSICAL EXAM:  VITAL SIGNS: ED Triage Vitals  Enc Vitals Group     BP 02/07/16 2122 108/61 mmHg     Pulse Rate 02/07/16 2122 91     Resp 02/07/16 2132 18     Temp 02/07/16 2122 98 F (36.7 C)     Temp Source 02/07/16 2122 Oral     SpO2 02/07/16 2122 100 %     Weight 02/07/16 2122 198 lb (89.812 kg)     Height 02/07/16 2122 5\' 7"  (1.702 m)     Head Cir --      Peak Flow --      Pain Score 02/07/16 2132 8     Pain Loc --      Pain Edu? --      Excl. in GC? --     Constitutional: Alert and oriented. Well appearing and in no acute distress. Eyes: Conjunctivae are normal. PERRL. EOMI. Head: Atraumatic. Nose: No  congestion/rhinnorhea. Mouth/Throat: Mucous membranes are moist.  Oropharynx non-erythematous. Neck: No stridor.  No cervical spine tenderness to palpation. Hematological/Lymphatic/Immunilogical: No cervical lymphadenopathy. Cardiovascular: Normal rate, regular rhythm. Grossly normal heart sounds.  Good peripheral circulation. Respiratory: Normal respiratory effort.  No retractions. Lungs with mild Rales. Gastrointestinal: Soft and nontender. No distention. No abdominal bruits. No CVA tenderness. Musculoskeletal: No obvious deformity of the left shoulder and right knee. Patient has some mild guarding  palpation of the [place] joint. Patient has decreased range of motion with adduction and overhead reaching. Strength against resistance right shoulder is 3/5. No obvious edema of the right knee. There is moderate crepitus palpation. Patient has full nuchal range of motion of the right knee. No laxity. Neurologic:  Normal speech and language. No gross focal neurologic deficits are appreciated. No gait instability. Skin:  Skin is warm, dry and intact. No rash noted. Psychiatric: Mood and affect are normal. Speech and behavior are normal.  ____________________________________________   LABS (all labs ordered are listed, but only abnormal results are displayed)  Labs Reviewed - No data to display ____________________________________________  EKG   ____________________________________________  RADIOLOGY  Reviewed x-ray is of left shoulder from previous exam which was unremarkable. ____________________________________________   PROCEDURES  Procedure(s) performed: None  Critical Care performed: No  ____________________________________________   INITIAL IMPRESSION / ASSESSMENT AND PLAN / ED COURSE  Pertinent labs & imaging results that were available during my care of the patient were reviewed by me and considered in my medical decision making (see chart for details).  Arthralgia right  knee. Muscle strain left shoulder. Cough secondary to bronchospasms. Patient given discharge care instructions. Patient given a prescription for Percocet, naproxen, Robaxin, and Bromfed-DM. Patient advised to re- establish care with family doctor. ____________________________________________   FINAL CLINICAL IMPRESSION(S) / ED DIAGNOSES  Final diagnoses:  Arthralgia  Muscle strain of left shoulder, subsequent encounter  Cough due to bronchospasm      NEW MEDICATIONS STARTED DURING THIS VISIT:  New Prescriptions   BROMPHENIRAMINE-PSEUDOEPHEDRINE-DM 30-2-10 MG/5ML SYRUP    Take 5 mLs by mouth 4 (four) times daily as needed.     Note:  This document was prepared using Dragon voice recognition software and may include unintentional dictation errors.    Joni Reining, PA-C 02/07/16 2225  Maurilio Lovely, MD 02/08/16 [phone]

## 2016-02-07 NOTE — ED Notes (Addendum)
Pt presents to ED with c/o RIGHT knee pain and swelling and LEFT shoulder pain. Denies injury to either leg or shoulder, reports has been going on for 3-4 days. Pt also c/o coughing, (+) yellow sputum x 2 days. States has been around family member smoking. Pt reports has hx of muscle spasms and has been of medication x 2 months, states was taking muscle relaxer and pain medicine. Pt reports increased pain to left shoulder with movement and reports unable to bear full weight to RIGHT leg due to pain. Mild swelling noted to RIGHT knee. Pt denies chest pain, shortness of breath,  and fevers. Pt alert and oriented x 4, skin warm and dry, respirations even and unlabored.

## 2016-02-07 NOTE — ED Notes (Signed)
Pt discharged to home.  Family member driving.  Discharge instructions reviewed.  Verbalized understanding.  No questions or concerns at this time.  Teach back verified.  Pt in NAD.  No items left in ED.   

## 2016-02-07 NOTE — Discharge Instructions (Signed)
Joint Pain Joint pain can be caused by many things. The joint can be bruised, infected, weak from aging, or sore from exercise. The pain will probably go away if you follow your doctor's instructions for home care. If your joint pain continues, more tests may be needed to help find the cause of your condition. HOME CARE Watch your condition for any changes. Follow these instructions as told to lessen the pain that you are feeling:  Take medicines only as told by your doctor.  Rest the sore joint for as long as told by your doctor. If your doctor tells you to, raise (elevate) the painful joint above the level of your heart while you are sitting or lying down.  Do not do things that cause pain or make the pain worse.  If told, put ice on the painful area:  Put ice in a plastic bag.  Place a towel between your skin and the bag.  Leave the ice on for 20 minutes, 2-3 times per day.  Wear an elastic bandage, splint, or sling as told by your doctor. Loosen the bandage or splint if your fingers or toes lose feeling (become numb) and tingle, or if they turn cold and blue.  Begin exercising or stretching the joint as told by your doctor. Ask your doctor what types of exercise are safe for you.  Keep all follow-up visits as told by your doctor. This is important. GET HELP IF:  Your pain gets worse and medicine does not help it.  Your joint pain does not get better in 3 days.  You have more bruising or swelling.  You have a fever.  You lose 10 pounds (4.5 kg) or more without trying. GET HELP RIGHT AWAY IF:  You are not able to move the joint.  Your fingers or toes become numb or they turn cold and blue.   This information is not intended to replace advice given to you by your health care provider. Make sure you discuss any questions you have with your health care provider.   Document Released: 08/20/2009 Document Revised: 09/22/2014 Document Reviewed: 06/13/2014 Elsevier Interactive  Patient Education 2016 Else[employer] Inc.  Bronchospasm, Adult A bronchospasm is when the tubes that carry air in and out of your lungs (airways) spasm or tighten. During a bronchospasm it is hard to breathe. This is because the airways get smaller. A bronchospasm can be triggered by:  Allergies. These may be to animals, pollen, food, or mold.  Infection. This is a common cause of bronchospasm.  Exercise.  Irritants. These include pollution, cigarette smoke, strong odors, aerosol sprays, and paint fumes.  Weather changes.  Stress.  Being emotional. HOME CARE   Always have a plan for getting help. Know when to call your doctor and local emergency services (911 in the [place] Know where you can get emergency care.  Only take medicines as told by your doctor.  If you were prescribed an inhaler or nebulizer machine, ask your doctor how to use it correctly. Always use a spacer with your inhaler if you were given one.  Stay calm during an attack. Try to relax and breathe more slowly.  Control your home environment:  Change your heating and air conditioning filter at least once a month.  Limit your use of fireplaces and wood stoves.  Do not  smoke. Do not  allow smoking in your home.  Avoid perfumes and fragrances.  Get rid of pests (such as roaches and mice) and their droppings.  Throw away plants if you see mold on them.  Keep your house clean and dust free.  Replace carpet with wood, tile, or vinyl flooring. Carpet can trap dander and dust.  Use allergy-proof pillows, mattress covers, and box spring covers.  Wash bed sheets and blankets every week in hot water. Dry them in a dryer.  Use blankets that are made of polyester or cotton.  Wash hands frequently. GET HELP IF:  You have muscle aches.  You have chest pain.  The thick spit you spit or cough up (sputum) changes from clear or white to yellow, green, gray, or bloody.  The thick spit you spit or cough up gets  thicker.  There are problems that may be related to the medicine you are given such as:  A rash.  Itching.  Swelling.  Trouble breathing. GET HELP RIGHT AWAY IF:  You feel you cannot breathe or catch your breath.  You cannot stop coughing.  Your treatment is not helping you breathe better.  You have very bad chest pain. MAKE SURE YOU:   Understand these instructions.  Will watch your condition.  Will get help right away if you are not doing well or get worse.   This information is not intended to replace advice given to you by your health care provider. Make sure you discuss any questions you have with your health care provider.   Document Released: 06/29/2009 Document Revised: 09/22/2014 Document Reviewed: 02/22/2013 Elsevier Interactive Patient Education [employer]

## 2016-02-27 ENCOUNTER — Emergency Department: Payer: Medicaid Other

## 2016-02-27 ENCOUNTER — Emergency Department
Admission: EM | Admit: 2016-02-27 | Discharge: 2016-02-27 | Disposition: A | Discharge status: Home / Self Care | Payer: Medicaid Other | Attending: Emergency Medicine | Admitting: Emergency Medicine

## 2016-02-27 ENCOUNTER — Encounter: Payer: Self-pay | Admitting: Emergency Medicine

## 2016-02-27 DIAGNOSIS — Z79899 Other long term (current) drug therapy: Secondary | ICD-10-CM | POA: Insufficient documentation

## 2016-02-27 DIAGNOSIS — M545 Low back pain: Secondary | ICD-10-CM | POA: Diagnosis not present

## 2016-02-27 DIAGNOSIS — F319 Bipolar disorder, unspecified: Secondary | ICD-10-CM | POA: Diagnosis not present

## 2016-02-27 DIAGNOSIS — G8929 Other chronic pain: Secondary | ICD-10-CM | POA: Insufficient documentation

## 2016-02-27 LAB — POCT PREGNANCY, URINE: PREG TEST UR: NEGATIVE

## 2016-02-27 MED ORDER — KETOROLAC TROMETHAMINE 30 MG/ML IJ SOLN
30.0000 mg | Freq: Once | INTRAMUSCULAR | Status: AC
  Administered 2016-02-27: 30 mg via INTRAVENOUS
  Filled 2016-02-27: qty 1

## 2016-02-27 MED ORDER — DICLOFENAC SODIUM 75 MG PO TBEC
75.0000 mg | DELAYED_RELEASE_TABLET | Freq: Two times a day (BID) | ORAL | Status: DC
Start: 2016-02-27 — End: 2016-06-23

## 2016-02-27 NOTE — ED Notes (Signed)
See triage note  States she was seen about a month ago for same states pain is worse   Waiting orthro appt

## 2016-02-27 NOTE — ED Notes (Signed)
Presents with lower back for about 1 month  Denies any injury  States pain does radiate into buttocks

## 2016-02-27 NOTE — ED Provider Notes (Signed)
[facility] Emergency Department Provider Note  ____________________________________________  Time seen: Approximately 10:18 AM  I have reviewed the triage vital signs and the nursing notes.   HISTORY  Chief Complaint Back Pain   HPI Lisa Rowland is a 30 y.o. female is here with complaint of low back pain for 1 month. Patient states that there is no radiation of her pain stays in one position. Patient states that she was seen in the emergency room last month for the same in which "nothing was done". Patient is also seen her PCP at [facility] and was referred to an orthopedist. Patient states she has appointment with someone but is not certain who and she cannot wait to the end of the month to be seen because she is in such severe pain.Patient denies any recent injury to her back. She denies any paresthesias into her lower extremities and there is been no history of incontinence of bowel or bladder. Patient continues to be ambulatory. She denies any urinary symptoms or history of kidney stone. Walking and standing make her back worse. Occasionally she is more comfortable when she is lying in bed. Currently she rates her pain as 10 over 10.  Patient states she has an appointment with an orthopedist on June 24 but is unaware of which orthopedist she will be seeing. This is an appointment that was set up by a [facility]. She states that she cannot deal with the pain until the 24th. Patient states she was given a prescription for naproxen by her primary care doctor but has not taken any medication in the last 2 days. Patient is also here with her son to be seen for a rash.    Past Medical History  Diagnosis Date  . Scoliosis   . Bipolar 1 disorder (HCC)   . Hypotension   . Anemia   . Anxiety   . ASCUS favor benign   . HPV in female     Patient Active Problem List   Diagnosis Date Noted  . Headache, migraine 01/23/2014  . Trichomonal fluor vaginalis  01/23/2014  . Clinical trial participant 11/25/2013  . Opioid dependence (HCC) 10/27/2013  . Myofascial pain 10/10/2013  . Continuous opioid dependence (HCC) 09/22/2013  . Bipolar affective disorder (HCC) 08/19/2013  . Scoliosis 08/19/2013  . Lesion of vulva 08/02/2013  . HPV test positive 06/08/2013  . Cervical pain 04/03/2012    Past Surgical History  Procedure Laterality Date  . Induced abortion      Current Outpatient Rx  Name  Route  Sig  Dispense  Refill  . albuterol (PROVENTIL HFA;VENTOLIN HFA) 108 (90 Base) MCG/ACT inhaler   Inhalation   Inhale 2 puffs into the lungs every 6 (six) hours as needed.          . brompheniramine-pseudoephedrine-DM 30-2-10 MG/5ML syrup   Oral   Take 5 mLs by mouth 4 (four) times daily as needed.   120 mL   0   . clonazePAM (KLONOPIN) 0.5 MG tablet   Oral   Take 0.5 mg by mouth 2 (two) times daily.          . diclofenac (VOLTAREN) 75 MG EC tablet   Oral   Take 1 tablet (75 mg total) by mouth 2 (two) times daily.   30 tablet   0   . lamoTRIgine (LAMICTAL) 100 MG tablet   Oral   Take 100 mg by mouth 2 (two) times daily.         Marland Kitchen  LORazepam (ATIVAN) 1 MG tablet   Oral   Take 1 tablet (1 mg total) by mouth 2 (two) times daily as needed (muscle spasm).   10 tablet   0   . QUEtiapine (SEROQUEL XR) 300 MG 24 hr tablet   Oral   Take 300 mg by mouth at bedtime.           Allergies Amoxicillin; Bee venom; Mushroom extract complex; and Coconut oil  Family History  Problem Relation Age of Onset  . Osteoarthritis Mother   . Osteoarthritis Father   . Osteoarthritis Maternal Grandmother   . Osteoarthritis Paternal Grandmother   . Asthma Maternal Grandmother   . Asthma Paternal Grandmother   . Asthma Mother   . Cancer Maternal Grandmother     breast cancer  . Cancer Paternal Grandmother     stomach  . Breast cancer Paternal Aunt   . Diabetes Maternal Grandmother   . Diabetes Paternal Grandmother   . Heart failure  Maternal Grandmother   . Heart failure Paternal Grandmother   . Hyperlipidemia Maternal Grandmother   . Hyperlipidemia Paternal Grandmother   . Hypertension Maternal Grandmother   . Hypertension Paternal Grandmother   . Hypertension Mother   . Hypertension Father   . Hypertension Sister   . Migraines Maternal Grandmother   . Thyroid disease Maternal Grandmother     Social History Social History  Substance Use Topics  . Smoking status: Never Smoker   . Smokeless tobacco: None  . Alcohol Use: No    Review of Systems Constitutional: No fever/chills Eyes: No visual changes. Cardiovascular: Denies chest pain. Respiratory: Denies shortness of breath. Gastrointestinal: No abdominal pain.  No nausea, no vomiting.  Genitourinary: Negative for dysuria.Denies kidney stones. Musculoskeletal: Positive for back pain. Skin: Negative for rash. Neurological: Negative for headaches, focal weakness or numbness.  10-point ROS otherwise negative.  ____________________________________________   PHYSICAL EXAM:  VITAL SIGNS: ED Triage Vitals  Enc Vitals Group     BP 02/27/16 1009 116/60 mmHg     Pulse Rate 02/27/16 1009 66     Resp 02/27/16 1009 20     Temp 02/27/16 1009 98.5 F (36.9 C)     Temp Source 02/27/16 1009 Oral     SpO2 02/27/16 1009 97 %     Weight 02/27/16 1009 198 lb (89.812 kg)     Height 02/27/16 1009 5\' 7"  (1.702 m)     Head Cir --      Peak Flow --      Pain Score 02/27/16 1008 10     Pain Loc --      Pain Edu? --      Excl. in GC? --     Constitutional: Alert and oriented. Well appearing and in no acute distress. Eyes: Conjunctivae are normal. PERRL. EOMI. Head: Atraumatic. Nose: No congestion/rhinnorhea. Neck: No stridor.   Cardiovascular: Normal rate, regular rhythm. Grossly normal heart sounds.  Good peripheral circulation. Respiratory: Normal respiratory effort.  No retractions. Lungs CTAB. Gastrointestinal: Soft and nontender. No distention.  No CVA  tenderness. Musculoskeletal: On examination back there is no gross deformity noted. There is tenderness on palpation of the lower lumbar spine. There is minimal tenderness on palpation of the paravertebral muscles surrounding. There is no active muscle spasm seen. The entire area including the coccyx was palpated to assure that there was Pilonidal cyst causing the patient's problem. Neurologic:  Normal speech and language. No gross focal neurologic deficits are appreciated. No gait instability. Reflexes are  2+ bilaterally. Skin:  Skin is warm, dry and intact. No rash noted. Psychiatric: Mood and affect are normal. Speech and behavior are normal.  ____________________________________________   LABS (all labs ordered are listed, but only abnormal results are displayed)  Labs Reviewed  POC URINE PREG, ED  POCT PREGNANCY, URINE     RADIOLOGY  Lumbar spine x-ray per radiologist shows no evidence of fracture and normal alignment was seen. I, Tommi Rumps, personally viewed and evaluated these images (plain radiographs) as part of my medical decision making, as well as reviewing the written report by the radiologist. ____________________________________________   PROCEDURES  Procedure(s) performed: None  Critical Care performed: No  ____________________________________________   INITIAL IMPRESSION / ASSESSMENT AND PLAN / ED COURSE  Pertinent labs & imaging results that were available during my care of the patient were reviewed by me and considered in my medical decision making (see chart for details).  Patient became disgruntled over medication and discharge. Patient states she wants answers today and that everyone "keeps giving her medication". Patient refused prednisone since she was on it 2-3 months ago and it did not help. Patient states that anti-inflammatories do not help her pain but was reminded that she has not taken her naproxen in the last 2 days at all. Patient is to  discontinue taking naproxen and she was given a prescription for that time and 75 mg 1 twice a day with food. She is to use ice or heat to her back. She is follow-up with her doctor at [facility] and also keep her orthopedic appointment on the 24th. ____________________________________________   FINAL CLINICAL IMPRESSION(S) / ED DIAGNOSES  Final diagnoses:  Chronic midline low back pain without sciatica      NEW MEDICATIONS STARTED DURING THIS VISIT:  Discharge Medication List as of 02/27/2016 12:26 PM    START taking these medications   Details  diclofenac (VOLTAREN) 75 MG EC tablet Take 1 tablet (75 mg total) by mouth 2 (two) times daily., Starting 02/27/2016, Until Discontinued, Print         Note:  This document was prepared using Dragon voice recognition software and may include unintentional dictation errors.    Tommi Rumps, PA-C 02/27/16 1343  Tommi Rumps, PA-C 02/27/16 1427  [facility], MD 02/27/16 [phone]

## 2016-02-27 NOTE — Discharge Instructions (Signed)
Call your doctor at  [facility] if any continued problems and keep your appointment with the orthopedist that your doctor has set up for you to see. Discontinue taking naproxen. You were given an injection of Toradol. You also are discharged with a prescription for etodolac 400 mg twice a day with food. Ice or heat to your back as needed and also when lying in bed put 2 pillows underneath your knees to help with your back pain.

## 2016-03-07 ENCOUNTER — Ambulatory Visit: Payer: Medicaid Other | Admitting: Anesthesiology

## 2016-04-24 ENCOUNTER — Other Ambulatory Visit: Payer: Self-pay | Admitting: Nurse Practitioner

## 2016-04-24 DIAGNOSIS — M5432 Sciatica, left side: Secondary | ICD-10-CM

## 2016-04-24 DIAGNOSIS — M5442 Lumbago with sciatica, left side: Secondary | ICD-10-CM

## 2016-05-05 ENCOUNTER — Ambulatory Visit: Payer: Medicaid Other

## 2016-05-20 ENCOUNTER — Ambulatory Visit: Payer: Medicaid Other | Admitting: Obstetrics and Gynecology

## 2016-05-22 ENCOUNTER — Encounter: Payer: Self-pay | Admitting: Emergency Medicine

## 2016-05-22 ENCOUNTER — Emergency Department
Admission: EM | Admit: 2016-05-22 | Discharge: 2016-05-22 | Disposition: A | Discharge status: Home / Self Care | Payer: Medicaid Other | Attending: Emergency Medicine | Admitting: Emergency Medicine

## 2016-05-22 DIAGNOSIS — M549 Dorsalgia, unspecified: Secondary | ICD-10-CM | POA: Diagnosis not present

## 2016-05-22 DIAGNOSIS — G8929 Other chronic pain: Secondary | ICD-10-CM | POA: Diagnosis not present

## 2016-05-22 MED ORDER — NAPROXEN 500 MG PO TABS: 500.0000 mg | ORAL_TABLET | Freq: Two times a day (BID) | ORAL | 0 refills | Status: DC

## 2016-05-22 NOTE — ED Triage Notes (Signed)
Pt states she has 5  Pinched nerves in her back.  States that her pain doctor retired and has been unable to get her meds

## 2016-05-22 NOTE — ED Notes (Signed)
Discharge instructions reviewed  Pt was upset b/c she felt like her no one listened to her  I offered to get the provider but she did not want to wait

## 2016-05-22 NOTE — ED Provider Notes (Signed)
[facility] Emergency Department Provider Note   ____________________________________________   None    (approximate)  I have reviewed the triage vital signs and the nursing notes.   HISTORY  Chief Complaint Back Pain    HPI Lisa Rowland is a 30 y.o. female is here requesting pain medication. Patient states that her regular pain management physician retired and she is unable to get a new appointment. Patient states that her primary care doctor at [facility] is trying to get a referral for a pain clinic. Patient states she has been out of her pain medication for 2 weeks. She states her regular medications including oxycodone 10 mg, naproxen 500 mg, and a muscle relaxant that started with a "M".Patient denies any recent injury to her back. Patient states that this is a chronic back pain with "5 pinched nerves in her back". She states that this is a result of having scoliosis. She denies any paresthesias or incontinence of bowel or bladder. There is no saddle paresthesias reported. Patient continues to ambulate without assistance. Currently she rates her pain as a 7/10.   Past Medical History:  Diagnosis Date  . Anemia   . Anxiety   . ASCUS favor benign   . Bipolar 1 disorder (HCC)   . HPV in female   . Hypotension   . Scoliosis     Patient Active Problem List   Diagnosis Date Noted  . Headache, migraine 01/23/2014  . 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 Clinical trial participant 11/25/2013  . Opioid dependence (HCC) 10/27/2013  . Myofascial pain 10/10/2013  . Continuous opioid dependence (HCC) 09/22/2013  . Bipolar affective disorder (HCC) 08/19/2013  . Scoliosis 08/19/2013  . Lesion of vulva 08/02/2013  . HPV test positive 06/08/2013  . Cervical pain 04/03/2012    Past Surgical History:  Procedure Laterality Date  . INDUCED ABORTION      Prior to Admission medications   Medication Sig Start Date End Date Taking? Authorizing  Provider  albuterol (PROVENTIL HFA;VENTOLIN HFA) 108 (90 Base) MCG/ACT inhaler Inhale 2 puffs into the lungs every 6 (six) hours as needed.  09/27/14   Historical Provider, MD  brompheniramine-pseudoephedrine-DM 30-2-10 MG/5ML syrup Take 5 mLs by mouth 4 (four) times daily as needed. 02/07/16   Joni Reining, PA-C  clonazePAM (KLONOPIN) 0.5 MG tablet Take 0.5 mg by mouth 2 (two) times daily.     Historical Provider, MD  diclofenac (VOLTAREN) 75 MG EC tablet Take 1 tablet (75 mg total) by mouth 2 (two) times daily. 02/27/16   Tommi Rumps, PA-C  lamoTRIgine (LAMICTAL) 100 MG tablet Take 100 mg by mouth 2 (two) times daily.    Historical Provider, MD  LORazepam (ATIVAN) 1 MG tablet Take 1 tablet (1 mg total) by mouth 2 (two) times daily as needed (muscle spasm). 11/24/15   Jennye Moccasin, MD  naproxen (NAPROSYN) 500 MG tablet Take 1 tablet (500 mg total) by mouth 2 (two) times daily with a meal. 05/22/16   Tommi Rumps, PA-C  QUEtiapine (SEROQUEL XR) 300 MG 24 hr tablet Take 300 mg by mouth at bedtime.    Historical Provider, MD    Allergies Amoxicillin; Bee venom; Mushroom extract complex; and Coconut oil  Family History  Problem Relation Age of Onset  . Osteoarthritis Mother   . Asthma Mother   . Hypertension Mother   . Osteoarthritis Father   . Hypertension Father   . Osteoarthritis Maternal Grandmother   . Asthma  Maternal Grandmother   . Cancer Maternal Grandmother     breast cancer  . Diabetes Maternal Grandmother   . Heart failure Maternal Grandmother   . Hyperlipidemia Maternal Grandmother   . Hypertension Maternal Grandmother   . Migraines Maternal Grandmother   . Thyroid disease Maternal Grandmother   . Osteoarthritis Paternal Grandmother   . Asthma Paternal Grandmother   . Cancer Paternal Grandmother     stomach  . Diabetes Paternal Grandmother   . Heart failure Paternal Grandmother   . Hyperlipidemia Paternal Grandmother   . Hypertension Paternal Grandmother   .  Breast cancer Paternal Aunt   . Hypertension Sister     Social History Social History  Substance Use Topics  . Smoking status: Never Smoker  . Smokeless tobacco: Never Used  . Alcohol use No    Review of Systems Constitutional: No fever/chills Eyes: No visual changes. Cardiovascular: Denies chest pain. Respiratory: Denies shortness of breath. Gastrointestinal: No abdominal pain.  No nausea, no vomiting.  No diarrhea.  No constipation. Genitourinary: Negative for dysuria. Musculoskeletal: Positive for chronic back pain. Skin: Negative for rash. Neurological: Negative for headaches, focal weakness or numbness.  10-point ROS otherwise negative.  ____________________________________________   PHYSICAL EXAM:  VITAL SIGNS: ED Triage Vitals  Enc Vitals Group     BP 05/22/16 1102 114/66     Pulse Rate 05/22/16 1102 93     Resp 05/22/16 1102 18     Temp 05/22/16 1102 97.9 F (36.6 C)     Temp Source 05/22/16 1102 Oral     SpO2 05/22/16 1102 100 %     Weight 05/22/16 1103 200 lb (90.7 kg)     Height 05/22/16 1103 5\' 6"  (1.676 m)     Head Circumference --      Peak Flow --      Pain Score 05/22/16 1103 7     Pain Loc --      Pain Edu? --      Excl. in GC? --     Constitutional: Alert and oriented. Well appearing and in no acute distress. Eyes: Conjunctivae are normal. PERRL. EOMI. Head: Atraumatic. Nose: No congestion/rhinnorhea. Neck: No stridor.   Cardiovascular: Normal rate, regular rhythm. Grossly normal heart sounds.  Good peripheral circulation. Respiratory: Normal respiratory effort.  No retractions. Lungs CTAB. Gastrointestinal: Soft and nontender. No distention.  No CVA tenderness. Musculoskeletal: On examination of the back there is no gross deformity. There is tenderness on palpation of L5-S1 area along with the sacrum and paravertebral muscles bilaterally. There is no active muscle spasm seen on range of motion. Straight leg raises were approximately 70  bilaterally and were negative. Normal gait was noted. Neurologic:  Normal speech and language. No gross focal neurologic deficits are appreciated. Reflexes were 1+ bilaterally. No gait instability. Skin:  Skin is warm, dry and intact. No rash noted. Psychiatric: Mood and affect are normal. Speech and behavior are normal.  ____________________________________________   LABS (all labs ordered are listed, but only abnormal results are displayed)  Labs Reviewed - No data to display  PROCEDURES  Procedure(s) performed: None  Procedures  Critical Care performed: No  ____________________________________________   INITIAL IMPRESSION / ASSESSMENT AND PLAN / ED COURSE  Pertinent labs & imaging results that were available during my care of the patient were reviewed by me and considered in my medical decision making (see chart for details).    Clinical Course   [facility] shows that patient has been  giving medication on a regular basis from 2 different doctors in [place]. Patient states that her primary care doctor is at [facility]. Patient last prescription dated 04/21/16 was for oxycodone 10 mg #90 for a 30 day supply. When asked about this patient states that her primary care doctor is at [facility]. She became somewhat agitated when [facility] was mentioned. Patient is to follow-up with [facility] for any continued narcotic pain medication. Patient was given a prescription for naproxen 500 mg today.  ____________________________________________   FINAL CLINICAL IMPRESSION(S) / ED DIAGNOSES  Final diagnoses:  Chronic back pain      NEW MEDICATIONS STARTED DURING THIS VISIT:  New Prescriptions   NAPROXEN (NAPROSYN) 500 MG TABLET    Take 1 tablet (500 mg total) by mouth 2 (two) times daily with a meal.     Note:  This document was prepared using Dragon voice recognition software and may include unintentional dictation  errors.    Tommi Rumps, PA-C 05/22/16 1255    Myrna Blazer, MD 05/22/16 [phone]

## 2016-05-22 NOTE — Discharge Instructions (Signed)
Follow-up with [facility] for any continued pain medication. Continue naproxen 500 mg twice a day with food for your chronic back pain.

## 2016-05-22 NOTE — ED Notes (Signed)
Patient states he pain clinic physician retired and she has been unable to get a new a[ppointment.  States her PCP is currently working on referrals for her to get into a new one, but she has been out of her pain medication for 2 weeks.  Patient states she takes oxycodone 10 mg, naproxen 500 mg, and a muscle relaxer that she is unsure what the name is.

## 2016-06-23 ENCOUNTER — Encounter: Payer: Self-pay | Admitting: Emergency Medicine

## 2016-06-23 ENCOUNTER — Emergency Department: Payer: Medicaid Other

## 2016-06-23 ENCOUNTER — Emergency Department
Admission: EM | Admit: 2016-06-23 | Discharge: 2016-06-23 | Disposition: A | Discharge status: Home / Self Care | Payer: Medicaid Other | Attending: Emergency Medicine | Admitting: Emergency Medicine

## 2016-06-23 DIAGNOSIS — S40012A Contusion of left shoulder, initial encounter: Secondary | ICD-10-CM | POA: Insufficient documentation

## 2016-06-23 DIAGNOSIS — Y92410 Unspecified street and highway as the place of occurrence of the external cause: Secondary | ICD-10-CM | POA: Diagnosis not present

## 2016-06-23 DIAGNOSIS — G43509 Persistent migraine aura without cerebral infarction, not intractable, without status migrainosus: Secondary | ICD-10-CM | POA: Insufficient documentation

## 2016-06-23 DIAGNOSIS — S8991XA Unspecified injury of right lower leg, initial encounter: Secondary | ICD-10-CM | POA: Diagnosis present

## 2016-06-23 DIAGNOSIS — Y939 Activity, unspecified: Secondary | ICD-10-CM | POA: Diagnosis not present

## 2016-06-23 DIAGNOSIS — Y999 Unspecified external cause status: Secondary | ICD-10-CM | POA: Diagnosis not present

## 2016-06-23 DIAGNOSIS — Z79899 Other long term (current) drug therapy: Secondary | ICD-10-CM | POA: Insufficient documentation

## 2016-06-23 DIAGNOSIS — S8001XA Contusion of right knee, initial encounter: Secondary | ICD-10-CM | POA: Insufficient documentation

## 2016-06-23 MED ORDER — NAPROXEN 500 MG PO TABS: 500.0000 mg | ORAL_TABLET | Freq: Two times a day (BID) | ORAL | Status: DC

## 2016-06-23 MED ORDER — ORPHENADRINE CITRATE 30 MG/ML IJ SOLN
60.0000 mg | Freq: Two times a day (BID) | INTRAMUSCULAR | Status: DC
  Administered 2016-06-23: 60 mg via INTRAMUSCULAR
  Filled 2016-06-23: qty 2

## 2016-06-23 MED ORDER — CYCLOBENZAPRINE HCL 10 MG PO TABS: 10.0000 mg | ORAL_TABLET | Freq: Three times a day (TID) | ORAL | 0 refills | Status: DC | PRN

## 2016-06-23 MED ORDER — TRAMADOL HCL 50 MG PO TABS
50.0000 mg | ORAL_TABLET | Freq: Once | ORAL | Status: AC
  Administered 2016-06-23: 50 mg via ORAL
  Filled 2016-06-23: qty 1

## 2016-06-23 MED ORDER — KETOROLAC TROMETHAMINE 60 MG/2ML IM SOLN
60.0000 mg | Freq: Once | INTRAMUSCULAR | Status: AC
  Administered 2016-06-23: 60 mg via INTRAMUSCULAR
  Filled 2016-06-23: qty 2

## 2016-06-23 MED ORDER — NAPROXEN 500 MG PO TABS: 500.0000 mg | ORAL_TABLET | Freq: Two times a day (BID) | ORAL | 0 refills | Status: DC

## 2016-06-23 MED ORDER — METHOCARBAMOL 750 MG PO TABS: 750.0000 mg | ORAL_TABLET | Freq: Four times a day (QID) | ORAL | 0 refills | Status: DC
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
## 2016-07-06 ENCOUNTER — Encounter: Payer: Self-pay | Admitting: Emergency Medicine

## 2016-07-06 ENCOUNTER — Emergency Department
Admission: EM | Admit: 2016-07-06 | Discharge: 2016-07-06 | Disposition: A | Discharge status: Home / Self Care | Payer: Medicaid Other | Attending: Emergency Medicine | Admitting: Emergency Medicine

## 2016-07-06 ENCOUNTER — Emergency Department: Payer: Medicaid Other

## 2016-07-06 DIAGNOSIS — S8011XA Contusion of right lower leg, initial encounter: Secondary | ICD-10-CM | POA: Diagnosis not present

## 2016-07-06 DIAGNOSIS — Z791 Long term (current) use of non-steroidal anti-inflammatories (NSAID): Secondary | ICD-10-CM | POA: Diagnosis not present

## 2016-07-06 DIAGNOSIS — J069 Acute upper respiratory infection, unspecified: Secondary | ICD-10-CM | POA: Insufficient documentation

## 2016-07-06 DIAGNOSIS — Z79899 Other long term (current) drug therapy: Secondary | ICD-10-CM | POA: Insufficient documentation

## 2016-07-06 DIAGNOSIS — Y92410 Unspecified street and highway as the place of occurrence of the external cause: Secondary | ICD-10-CM | POA: Insufficient documentation

## 2016-07-06 DIAGNOSIS — Y999 Unspecified external cause status: Secondary | ICD-10-CM | POA: Diagnosis not present

## 2016-07-06 DIAGNOSIS — M25532 Pain in left wrist: Secondary | ICD-10-CM | POA: Insufficient documentation

## 2016-07-06 DIAGNOSIS — S8991XA Unspecified injury of right lower leg, initial encounter: Secondary | ICD-10-CM | POA: Diagnosis present

## 2016-07-06 DIAGNOSIS — Y939 Activity, unspecified: Secondary | ICD-10-CM | POA: Insufficient documentation

## 2016-07-06 MED ORDER — PROMETHAZINE-DM 6.25-15 MG/5ML PO SYRP: 5.0000 mL | ORAL_SOLUTION | Freq: Four times a day (QID) | ORAL | 0 refills | Status: DC | PRN
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
## 2016-07-06 NOTE — Discharge Instructions (Signed)
Continue taking naproxen and Robaxin as directed by your primary care physician. Ice to your leg and wrist as needed for pain. Call this week and make an appointment with your primary care doctor if any continued problems. Take Phenergan DM as needed for cough and congestion.

## 2016-07-06 NOTE — ED Notes (Signed)
Pt able to ambulate to bed from wheel chair with no assistance and with steady gait

## 2016-07-06 NOTE — ED Notes (Signed)
Pt states upset that we "didn't do anything" - recommended she call her primary care dr for follow and to continue taking the meds prescribed along with the rx we prescribed. Pt ambulated with steady gait to the exit

## 2016-08-19 ENCOUNTER — Other Ambulatory Visit: Payer: Self-pay | Admitting: Anesthesiology

## 2016-08-19 DIAGNOSIS — M5440 Lumbago with sciatica, unspecified side: Secondary | ICD-10-CM

## 2016-08-29 ENCOUNTER — Ambulatory Visit
Admission: RE | Admit: 2016-08-29 | Discharge: 2016-08-29 | Disposition: A | Discharge status: Home / Self Care | Payer: Medicaid Other | Source: Ambulatory Visit | Attending: Anesthesiology | Admitting: Anesthesiology

## 2016-08-29 DIAGNOSIS — M5126 Other intervertebral disc displacement, lumbar region: Secondary | ICD-10-CM | POA: Insufficient documentation

## 2016-08-29 DIAGNOSIS — M5440 Lumbago with sciatica, unspecified side: Secondary | ICD-10-CM

## 2016-09-17 ENCOUNTER — Emergency Department
Admission: EM | Admit: 2016-09-17 | Discharge: 2016-09-17 | Disposition: A | Discharge status: Home / Self Care | Payer: Medicaid Other | Attending: Emergency Medicine | Admitting: Emergency Medicine

## 2016-09-17 ENCOUNTER — Encounter: Payer: Self-pay | Admitting: Emergency Medicine

## 2016-09-17 DIAGNOSIS — M25532 Pain in left wrist: Secondary | ICD-10-CM | POA: Diagnosis present

## 2016-09-17 DIAGNOSIS — G5602 Carpal tunnel syndrome, left upper limb: Secondary | ICD-10-CM | POA: Insufficient documentation

## 2016-09-17 DIAGNOSIS — J069 Acute upper respiratory infection, unspecified: Secondary | ICD-10-CM | POA: Insufficient documentation

## 2016-09-17 MED ORDER — PSEUDOEPH-BROMPHEN-DM 30-2-10 MG/5ML PO SYRP: 5.0000 mL | ORAL_SOLUTION | Freq: Four times a day (QID) | ORAL | 0 refills | Status: DC | PRN

## 2016-09-17 MED ORDER — NAPROXEN 500 MG PO TABS: 500.0000 mg | ORAL_TABLET | Freq: Two times a day (BID) | ORAL | Status: DC

## 2016-09-17 NOTE — ED Triage Notes (Signed)
Pt c/o numbness in the left hand with pain shooting up the FA for over a week..denies injury

## 2016-09-17 NOTE — ED Notes (Signed)
States she has been having some nasal and chest congestion for several days   But also having some numbness to left hand  States pain shoots up into arm  Denies any injury

## 2016-09-17 NOTE — ED Provider Notes (Signed)
[facility] Emergency Department Provider Note   ____________________________________________   First MD Initiated Contact with Patient 09/17/16 1151     (approximate)  I have reviewed the triage vital signs and the nursing notes.   HISTORY  Chief Complaint Hand Pain    HPI Lisa Rowland is a 31 y.o. female patient complain left wrist and arm pain for 1 week. Patient state this transit numbness to the left hand. Patient denies any provocative incident for this complaint. She is left-hand dominant. Patient also complaining of nasal congestion, postnasal drainage, and cough. Patient denies any fever or chills associated this complaint. Patient denies nausea vomiting diarrhea. Patient rates her pain discomfort as a 5/10. No palliative measures for these complaint. Past Medical History:  Diagnosis Date  . Anemia   . Anxiety   . ASCUS favor benign   . Bipolar 1 disorder (HCC)   . HPV in female   . Hypotension   . Scoliosis     Patient Active Problem List   Diagnosis Date Noted  . Headache, migraine 01/23/2014  . 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 Clinical trial participant 11/25/2013  . Opioid dependence (HCC) 10/27/2013  . Myofascial pain 10/10/2013  . Continuous opioid dependence (HCC) 09/22/2013  . Bipolar affective disorder (HCC) 08/19/2013  . Scoliosis 08/19/2013  . Lesion of vulva 08/02/2013  . HPV test positive 06/08/2013  . Cervical pain 04/03/2012    Past Surgical History:  Procedure Laterality Date  . INDUCED ABORTION      Prior to Admission medications   Medication Sig Start Date End Date Taking? Authorizing Provider  albuterol (PROVENTIL HFA;VENTOLIN HFA) 108 (90 Base) MCG/ACT inhaler Inhale 2 puffs into the lungs every 6 (six) hours as needed.  09/27/14   Historical Provider, MD  brompheniramine-pseudoephedrine-DM 30-2-10 MG/5ML syrup Take 5 mLs by mouth 4 (four) times daily as needed. 09/17/16   Joni Reining, PA-C    clonazePAM (KLONOPIN) 0.5 MG tablet Take 0.5 mg by mouth 2 (two) times daily.     Historical Provider, MD  cyclobenzaprine (FLEXERIL) 10 MG tablet Take 1 tablet (10 mg total) by mouth 3 (three) times daily as needed. 06/23/16   Joni Reining, PA-C  lamoTRIgine (LAMICTAL) 100 MG tablet Take 100 mg by mouth 2 (two) times daily.    Historical Provider, MD  methocarbamol (ROBAXIN-750) 750 MG tablet Take 1 tablet (750 mg total) by mouth 4 (four) times daily. 06/23/16   Joni Reining, PA-C  naproxen (NAPROSYN) 500 MG tablet Take 1 tablet (500 mg total) by mouth 2 (two) times daily with a meal. 06/23/16   Joni Reining, PA-C  naproxen (NAPROSYN) 500 MG tablet Take 1 tablet (500 mg total) by mouth 2 (two) times daily with a meal. 06/23/16   Joni Reining, PA-C  naproxen (NAPROSYN) 500 MG tablet Take 1 tablet (500 mg total) by mouth 2 (two) times daily with a meal. 09/17/16   Joni Reining, PA-C  promethazine-dextromethorphan (PROMETHAZINE-DM) 6.25-15 MG/5ML syrup Take 5 mLs by mouth 4 (four) times daily as needed for cough. 07/06/16   Tommi Rumps, PA-C  QUEtiapine (SEROQUEL XR) 300 MG 24 hr tablet Take 300 mg by mouth at bedtime.    Historical Provider, MD    Allergies Amoxicillin; Bee venom; Mushroom extract complex; and Coconut oil  Family History  Problem Relation Age of Onset  . Osteoarthritis Mother   . Asthma Mother   . Hypertension Mother   . Osteoarthritis Father   .  Hypertension Father   . Osteoarthritis Maternal Grandmother   . Asthma Maternal Grandmother   . Cancer Maternal Grandmother     breast cancer  . Diabetes Maternal Grandmother   . Heart failure Maternal Grandmother   . Hyperlipidemia Maternal Grandmother   . Hypertension Maternal Grandmother   . Migraines Maternal Grandmother   . Thyroid disease Maternal Grandmother   . Osteoarthritis Paternal Grandmother   . Asthma Paternal Grandmother   . Cancer Paternal Grandmother     stomach  . Diabetes Paternal Grandmother    . Heart failure Paternal Grandmother   . Hyperlipidemia Paternal Grandmother   . Hypertension Paternal Grandmother   . Breast cancer Paternal Aunt   . Hypertension Sister     Social History Social History  Substance Use Topics  . Smoking status: Never Smoker  . Smokeless tobacco: Never Used  . Alcohol use No    Review of Systems Constitutional: No fever/chills Eyes: No visual changes. ENT: No sore throat. Cardiovascular: Denies chest pain. Respiratory: Denies shortness of breath. Gastrointestinal: No abdominal pain.  No nausea, no vomiting.  No diarrhea.  No constipation. Genitourinary: Negative for dysuria. Musculoskeletal: Negative for back pain. Skin: Negative for rash. Neurological: Negative for headaches, focal weakness or numbness. Psychiatric:Bipolar and anxiety Endocrine:Hypotension Hematological/Lymphatic: Allergic/Immunilogical: See medication list ____________________________________   PHYSICAL EXAM:  VITAL SIGNS: ED Triage Vitals  Enc Vitals Group     BP 09/17/16 1148 115/61     Pulse Rate 09/17/16 1148 68     Resp 09/17/16 1148 18     Temp 09/17/16 1148 98.4 F (36.9 C)     Temp Source 09/17/16 1148 Oral     SpO2 09/17/16 1148 100 %     Weight 09/17/16 1149 194 lb (88 kg)     Height 09/17/16 1149 5\' 7"  (1.702 m)     Head Circumference --      Peak Flow --      Pain Score --      Pain Loc --      Pain Edu? --      Excl. in GC? --     Constitutional: Alert and oriented. Well appearing and in no acute distress. Eyes: Conjunctivae are normal. PERRL. EOMI. Head: Atraumatic. Nose: Edematous nasal terms clear rhinorrhea Mouth/Throat: Mucous membranes are moist.  Oropharynx non-erythematous. Postnasal drainage Neck: No stridor.  No cervical spine tenderness to palpation. Hematological/Lymphatic/Immunilogical: No cervical lymphadenopathy. Cardiovascular: Normal rate, regular rhythm. Grossly normal heart sounds.  Good peripheral  circulation. Respiratory: Normal respiratory effort.  No retractions. Lungs CTAB. Productive cough Gastrointestinal: Soft and nontender. No distention. No abdominal bruits. No CVA tenderness. Musculoskeletal: No obvious deformity edema or erythema to the left hand. Patient has full nuchal range of motion. Grip strength is 5 over 5.  Neurologic:  Normal speech and language. Patient has positive Tinel's. No gait instability. Skin:  Skin is warm, dry and intact. No rash noted. Psychiatric: Mood and affect are normal. Speech and behavior are normal.  ____________________________________________   LABS (all labs ordered are listed, but only abnormal results are displayed)  Labs Reviewed - No data to display ____________________________________________  EKG   ____________________________________________  RADIOLOGY   ____________________________________________   PROCEDURES  Procedure(s) performed: None  Procedures  Critical Care performed: No  ____________________________________________   INITIAL IMPRESSION / ASSESSMENT AND PLAN / ED COURSE  Pertinent labs & imaging results that were available during my care of the patient were reviewed by me and considered in my medical decision making (  see chart for details).  Carpal tunnel syndrome and upper respiratory infection. Patient given discharge care instructions. Patient given a prescription for naproxen and from felt DM. Patient advised to follow-up family doctor condition persists.  Clinical Course      ____________________________________________   FINAL CLINICAL IMPRESSION(S) / ED DIAGNOSES  Final diagnoses:  Carpal tunnel syndrome of left wrist  Upper respiratory infection with cough and congestion      NEW MEDICATIONS STARTED DURING THIS VISIT:  New Prescriptions   BROMPHENIRAMINE-PSEUDOEPHEDRINE-DM 30-2-10 MG/5ML SYRUP    Take 5 mLs by mouth 4 (four) times daily as needed.   NAPROXEN (NAPROSYN) 500 MG  TABLET    Take 1 tablet (500 mg total) by mouth 2 (two) times daily with a meal.     Note:  This document was prepared using Dragon voice recognition software and may include unintentional dictation errors.    Joni Reining, PA-C 09/17/16 1207    Myrna Blazer, MD 09/17/16 [phone]

## 2016-09-17 NOTE — Discharge Instructions (Signed)
Weight wrist sprain 7-10 days while awake.

## 2016-09-18 ENCOUNTER — Emergency Department
Admission: EM | Admit: 2016-09-18 | Discharge: 2016-09-18 | Disposition: A | Discharge status: Home / Self Care | Payer: Medicaid Other | Attending: Student in an Organized Health Care Education/Training Program | Admitting: Student in an Organized Health Care Education/Training Program

## 2016-09-18 ENCOUNTER — Telehealth: Payer: Self-pay | Admitting: Emergency Medicine

## 2016-09-18 ENCOUNTER — Encounter: Payer: Self-pay | Admitting: Emergency Medicine

## 2016-09-18 ENCOUNTER — Emergency Department: Payer: Medicaid Other

## 2016-09-18 DIAGNOSIS — Y9301 Activity, walking, marching and hiking: Secondary | ICD-10-CM | POA: Diagnosis not present

## 2016-09-18 DIAGNOSIS — Z79899 Other long term (current) drug therapy: Secondary | ICD-10-CM | POA: Insufficient documentation

## 2016-09-18 DIAGNOSIS — S93491A Sprain of other ligament of right ankle, initial encounter: Secondary | ICD-10-CM | POA: Insufficient documentation

## 2016-09-18 DIAGNOSIS — Y9289 Other specified places as the place of occurrence of the external cause: Secondary | ICD-10-CM | POA: Diagnosis not present

## 2016-09-18 DIAGNOSIS — Y999 Unspecified external cause status: Secondary | ICD-10-CM | POA: Diagnosis not present

## 2016-09-18 DIAGNOSIS — S99911A Unspecified injury of right ankle, initial encounter: Secondary | ICD-10-CM | POA: Diagnosis present

## 2016-09-18 DIAGNOSIS — X501XXA Overexertion from prolonged static or awkward postures, initial encounter: Secondary | ICD-10-CM | POA: Diagnosis not present

## 2016-09-18 MED ORDER — MELOXICAM 7.5 MG PO TABS
15.0000 mg | ORAL_TABLET | Freq: Once | ORAL | Status: AC
  Administered 2016-09-18: 15 mg via ORAL
  Filled 2016-09-18: qty 2

## 2016-09-18 MED ORDER — MELOXICAM 15 MG PO TABS: 15.0000 mg | ORAL_TABLET | Freq: Every day | ORAL | 0 refills | Status: DC

## 2016-09-18 MED ORDER — TRAMADOL HCL 50 MG PO TABS: 50.0000 mg | ORAL_TABLET | Freq: Four times a day (QID) | ORAL | 0 refills | Status: DC | PRN

## 2016-09-18 NOTE — ED Triage Notes (Signed)
Patient presents to the ED with right ankle pain after rolling her ankle approximately 15-20 minutes prior to arrival.  Patient is in no obvious distress at this time.  Patient recently injured her left wrist.

## 2016-09-18 NOTE — Telephone Encounter (Signed)
Pt had left message yesterday about her cough med.  I was able to call her back around 430pm.  She wants cough med changed to something on 4 dollar list or something covered by medicaid.  I explained that the provider she saw is not here.  I advised her to go to pharmacy and ask what otc would be best for her sx, as the med that was given to her had otc meds in it.  She said she does not have money to buy the medicines and says she was told that I could change the med.  I explained that I could not change her prescription, but I offered to ask the provider today.  I called her back to day to explain that the provider did not want to change the medication.  I told her about the medication management clinic with free meds.  I had also advised her to call her pcp both yesterday and today.

## 2016-09-18 NOTE — ED Provider Notes (Signed)
[facility] Emergency Department Provider Note ____________________________________________  Time seen: Approximately 3:10 PM  I have reviewed the triage vital signs and the nursing notes.   HISTORY  Chief Complaint Ankle Pain    HPI Lisa Rowland is a 31 y.o. female who presents to the ER for evaluation of right ankle pain. She states she was walking in [place] and "rolled" her ankle. She denies previous ankle injury. She has not taken anything for pain since the incident.  Past Medical History:  Diagnosis Date  . Anemia   . Anxiety   . ASCUS favor benign   . Bipolar 1 disorder (HCC)   . HPV in female   . Hypotension   . Scoliosis     Patient Active Problem List   Diagnosis Date Noted  . Headache, migraine 01/23/2014  . 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 Clinical trial participant 11/25/2013  . Opioid dependence (HCC) 10/27/2013  . Myofascial pain 10/10/2013  . Continuous opioid dependence (HCC) 09/22/2013  . Bipolar affective disorder (HCC) 08/19/2013  . Scoliosis 08/19/2013  . Lesion of vulva 08/02/2013  . HPV test positive 06/08/2013  . Cervical pain 04/03/2012    Past Surgical History:  Procedure Laterality Date  . INDUCED ABORTION      Prior to Admission medications   Medication Sig Start Date End Date Taking? Authorizing Provider  albuterol (PROVENTIL HFA;VENTOLIN HFA) 108 (90 Base) MCG/ACT inhaler Inhale 2 puffs into the lungs every 6 (six) hours as needed.  09/27/14   Historical Provider, MD  clonazePAM (KLONOPIN) 0.5 MG tablet Take 0.5 mg by mouth 2 (two) times daily.     Historical Provider, MD  lamoTRIgine (LAMICTAL) 100 MG tablet Take 100 mg by mouth 2 (two) times daily.    Historical Provider, MD  meloxicam (MOBIC) 15 MG tablet Take 1 tablet (15 mg total) by mouth daily. 09/18/16   Chinita Pester, FNP  methocarbamol (ROBAXIN-750) 750 MG tablet Take 1 tablet (750 mg total) by mouth 4 (four) times daily. 06/23/16   Joni Reining, PA-C  QUEtiapine (SEROQUEL XR) 300 MG 24 hr tablet Take 300 mg by mouth at bedtime.    Historical Provider, MD  traMADol (ULTRAM) 50 MG tablet Take 1 tablet (50 mg total) by mouth every 6 (six) hours as needed. 09/18/16   Chinita Pester, FNP    Allergies Amoxicillin; Bee venom; Mushroom extract complex; and Coconut oil  Family History  Problem Relation Age of Onset  . Osteoarthritis Mother   . Asthma Mother   . Hypertension Mother   . Osteoarthritis Father   . Hypertension Father   . Osteoarthritis Maternal Grandmother   . Asthma Maternal Grandmother   . Cancer Maternal Grandmother     breast cancer  . Diabetes Maternal Grandmother   . Heart failure Maternal Grandmother   . Hyperlipidemia Maternal Grandmother   . Hypertension Maternal Grandmother   . Migraines Maternal Grandmother   . Thyroid disease Maternal Grandmother   . Osteoarthritis Paternal Grandmother   . Asthma Paternal Grandmother   . Cancer Paternal Grandmother     stomach  . Diabetes Paternal Grandmother   . Heart failure Paternal Grandmother   . Hyperlipidemia Paternal Grandmother   . Hypertension Paternal Grandmother   . Breast cancer Paternal Aunt   . Hypertension Sister     Social History Social History  Substance Use Topics  . Smoking status: Never Smoker  . Smokeless tobacco: Never Used  . Alcohol use No  Review of Systems Constitutional: No recent illness. Cardiovascular: Denies chest pain or palpitations. Respiratory: Denies shortness of breath. Musculoskeletal: Pain in right ankle. Skin: Negative for rash, wound, lesion. Neurological: Negative for focal weakness or numbness.  ____________________________________________   PHYSICAL EXAM:  VITAL SIGNS: ED Triage Vitals  Enc Vitals Group     BP --      Pulse --      Resp --      Temp --      Temp src --      SpO2 --      Weight 09/18/16 1447 196 lb (88.9 kg)     Height 09/18/16 1447 5\' 7"  (1.702 m)     Head Circumference  --      Peak Flow --      Pain Score 09/18/16 1452 8     Pain Loc --      Pain Edu? --      Excl. in GC? --     Constitutional: Alert and oriented. Well appearing and in no acute distress. Eyes: Conjunctivae are normal. EOMI. Head: Atraumatic. Neck: No stridor.  Respiratory: Normal respiratory effort.   Musculoskeletal: ATFL pattern pain and swelling about the right ankle. No bony deformity. Limited ROM secondary to pain and swelling. Left wrist/forearm in cockup splint from previous injury.  Neurologic:  Normal speech and language. No gross focal neurologic deficits are appreciated. Speech is normal. No gait instability. Skin:  Skin is warm, dry and intact. Atraumatic. Psychiatric: Mood and affect are normal. Speech and behavior are normal.  ____________________________________________   LABS (all labs ordered are listed, but only abnormal results are displayed)  Labs Reviewed - No data to display ____________________________________________  RADIOLOGY  Right ankle negative for acute bony abnormality. I, Kem Boroughsari Orphia Mctigue, personally viewed and evaluated these images (plain radiographs) as part of my medical decision making, as well as reviewing the written report by the radiologist.  ___________________________________________   PROCEDURES  Procedure(s) performed: ACE bandage applied to right ankle by ER tech. Neurovascularly intact post application.   ____________________________________________   INITIAL IMPRESSION / ASSESSMENT AND PLAN / ED COURSE  Clinical Course     Pertinent labs & imaging results that were available during my care of the patient were reviewed by me and considered in my medical decision making (see chart for details).  31 year old female presenting to the emergency department for evaluation of right ankle pain after twisting it while walking in a store just prior to arrival. Clinical evaluation and x-ray findings consistent with right ankle  sprain. Ace bandage was applied and the patient was instructed to follow up with her primary care provider or orthopedics for symptoms that are not improving over the week. She was given prescriptions for meloxicam and tramadol. She is to return to the emergency department for symptoms that change or worsen if she is unable schedule an appointment. ____________________________________________   FINAL CLINICAL IMPRESSION(S) / ED DIAGNOSES  Final diagnoses:  Sprain of anterior talofibular ligament of right ankle, initial encounter       Chinita PesterCari B Shoshanah Dapper, FNP 09/18/16 1901    Willy EddyPatrick Robinson, MD 09/19/16 2057

## 2016-09-18 NOTE — Discharge Instructions (Signed)
Follow up with orthopedics for symptoms that are not improving over the week. Return to the ER for symptoms that change or worsen if you are unable to schedule an appointment with [facility] or the orthopedist.

## 2016-11-30 ENCOUNTER — Encounter: Payer: Self-pay | Admitting: Emergency Medicine

## 2016-11-30 ENCOUNTER — Emergency Department
Admission: EM | Admit: 2016-11-30 | Discharge: 2016-11-30 | Disposition: A | Discharge status: Home / Self Care | Payer: Medicaid Other | Attending: Emergency Medicine | Admitting: Emergency Medicine

## 2016-11-30 ENCOUNTER — Emergency Department: Payer: Medicaid Other

## 2016-11-30 DIAGNOSIS — R05 Cough: Secondary | ICD-10-CM | POA: Diagnosis present

## 2016-11-30 DIAGNOSIS — R072 Precordial pain: Secondary | ICD-10-CM | POA: Insufficient documentation

## 2016-11-30 DIAGNOSIS — Z79899 Other long term (current) drug therapy: Secondary | ICD-10-CM | POA: Diagnosis not present

## 2016-11-30 DIAGNOSIS — J069 Acute upper respiratory infection, unspecified: Secondary | ICD-10-CM | POA: Diagnosis not present

## 2016-11-30 LAB — CBC
HEMATOCRIT: 38.2 % (ref 35.0–47.0)
HEMOGLOBIN: 12.9 g/dL (ref 12.0–16.0)
MCH: 28.4 pg (ref 26.0–34.0)
MCHC: 33.8 g/dL (ref 32.0–36.0)
MCV: 83.9 fL (ref 80.0–100.0)
Platelets: 253 10*3/uL (ref 150–440)
RBC: 4.55 MIL/uL (ref 3.80–5.20)
RDW: 13.5 % (ref 11.5–14.5)
WBC: 6.9 10*3/uL (ref 3.6–11.0)

## 2016-11-30 LAB — BASIC METABOLIC PANEL
ANION GAP: 3 — AB (ref 5–15)
BUN: 8 mg/dL (ref 6–20)
CHLORIDE: 106 mmol/L (ref 101–111)
CO2: 28 mmol/L (ref 22–32)
Calcium: 8.7 mg/dL — ABNORMAL LOW (ref 8.9–10.3)
Creatinine, Ser: 0.65 mg/dL (ref 0.44–1.00)
GFR calc Af Amer: >60 mL/min (ref >60)
GFR calc non Af Amer: >60 mL/min (ref >60)
Glucose, Bld: 100 mg/dL — ABNORMAL HIGH (ref 65–99)
POTASSIUM: 3.8 mmol/L (ref 3.5–5.1)
Sodium: 137 mmol/L (ref 135–145)

## 2016-11-30 LAB — TROPONIN I: Troponin I: <0.03 ng/mL (ref <0.03)

## 2016-11-30 MED ORDER — PSEUDOEPH-BROMPHEN-DM 30-2-10 MG/5ML PO SYRP: 5.0000 mL | ORAL_SOLUTION | Freq: Three times a day (TID) | ORAL | 0 refills | Status: DC | PRN

## 2016-11-30 NOTE — ED Provider Notes (Signed)
[facility]-EMERGENCY DEPARTMENT Provider Note   CSN: [id number] Arrival date & time: 11/30/16  1029     History   Chief Complaint Chief Complaint  Patient presents with  . Nasal Congestion  . Cough  . Chest Pain    HPI Lisa Rowland is a 31 y.o. female presents to the emergency department for evaluation of nasal congestion cough and chest pain. Her symptoms been present for 7 days. She denies any fevers. She's also had a sore throat with body aches. She has not been taking Tylenol or ibuprofen. She's been taking Mucinex. She's been drinking fluids and tolerating by mouth well. She describes her chest pain is midsternal, sharp and increased with taking a deep breath and painful after coughing. She denies any pressure or radiation of pain. Pain in her chest is only present with coughing. She has no pain with exertion. No vomiting, diarrhea or rashes. No abdominal pain.  HPI  Past Medical History:  Diagnosis Date  . Anemia   . Anxiety   . ASCUS favor benign   . Bipolar 1 disorder (HCC)   . HPV in female   . Hypotension   . Scoliosis     Patient Active Problem List   Diagnosis Date Noted  . Headache, migraine 01/23/2014  . 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 Clinical trial participant 11/25/2013  . Opioid dependence (HCC) 10/27/2013  . Myofascial pain 10/10/2013  . Continuous opioid dependence (HCC) 09/22/2013  . Bipolar affective disorder (HCC) 08/19/2013  . Scoliosis 08/19/2013  . Lesion of vulva 08/02/2013  . HPV test positive 06/08/2013  . Cervical pain 04/03/2012    Past Surgical History:  Procedure Laterality Date  . INDUCED ABORTION      OB History    Gravida Para Term Preterm AB Living   5 3 3  0 2 3   SAB TAB Ectopic Multiple Live Births   0 2 0 0 3       Home Medications    Prior to Admission medications   Medication Sig Start Date End Date Taking? Authorizing Provider  albuterol (PROVENTIL HFA;VENTOLIN HFA) 108 (90 Base) MCG/ACT inhaler Inhale  2 puffs into the lungs every 6 (six) hours as needed.  09/27/14   Historical Provider, MD  brompheniramine-pseudoephedrine-DM 30-2-10 MG/5ML syrup Take 5 mLs by mouth 3 (three) times daily as needed. 11/30/16   Evon Slack, PA-C  clonazePAM (KLONOPIN) 0.5 MG tablet Take 0.5 mg by mouth 2 (two) times daily.     Historical Provider, MD  lamoTRIgine (LAMICTAL) 100 MG tablet Take 100 mg by mouth 2 (two) times daily.    Historical Provider, MD  meloxicam (MOBIC) 15 MG tablet Take 1 tablet (15 mg total) by mouth daily. 09/18/16   Chinita Pester, FNP  methocarbamol (ROBAXIN-750) 750 MG tablet Take 1 tablet (750 mg total) by mouth 4 (four) times daily. 06/23/16   Joni Reining, PA-C  QUEtiapine (SEROQUEL XR) 300 MG 24 hr tablet Take 300 mg by mouth at bedtime.    Historical Provider, MD  traMADol (ULTRAM) 50 MG tablet Take 1 tablet (50 mg total) by mouth every 6 (six) hours as needed. 09/18/16   Chinita Pester, FNP    Family History Family History  Problem Relation Age of Onset  . Osteoarthritis Mother   . Asthma Mother   . Hypertension Mother   . Osteoarthritis Father   . Hypertension Father   . Osteoarthritis Maternal Grandmother   . Asthma Maternal Grandmother   .  Cancer Maternal Grandmother     breast cancer  . Diabetes Maternal Grandmother   . Heart failure Maternal Grandmother   . Hyperlipidemia Maternal Grandmother   . Hypertension Maternal Grandmother   . Migraines Maternal Grandmother   . Thyroid disease Maternal Grandmother   . Osteoarthritis Paternal Grandmother   . Asthma Paternal Grandmother   . Cancer Paternal Grandmother     stomach  . Diabetes Paternal Grandmother   . Heart failure Paternal Grandmother   . Hyperlipidemia Paternal Grandmother   . Hypertension Paternal Grandmother   . Breast cancer Paternal Aunt   . Hypertension Sister     Social History Social History  Substance Use Topics  . Smoking status: Never Smoker  . Smokeless tobacco: Never Used  . Alcohol  use No     Allergies   Amoxicillin; Bee venom; Mushroom extract complex; and Coconut oil   Review of Systems Review of Systems  Constitutional: Negative for activity change, chills, fatigue and fever.  HENT: Positive for congestion and sore throat. Negative for rhinorrhea and sinus pressure.   Eyes: Negative for visual disturbance.  Respiratory: Positive for cough. Negative for chest tightness and shortness of breath.   Cardiovascular: Positive for chest pain. Negative for leg swelling.  Gastrointestinal: Negative for abdominal pain, diarrhea, nausea and vomiting.  Genitourinary: Negative for dysuria.  Musculoskeletal: Positive for myalgias. Negative for arthralgias and gait problem.  Skin: Negative for rash.  Neurological: Negative for weakness, numbness and headaches.  Hematological: Negative for adenopathy.  Psychiatric/Behavioral: Negative for agitation, behavioral problems and confusion.     Physical Exam Updated Vital Signs BP 110/69 (BP Location: Left Arm)   Pulse 70   Temp 98.4 F (36.9 C) (Oral)   Resp 20   Ht 5\' 7"  (1.702 m)   Wt 89.4 kg   SpO2 98%   BMI 30.85 kg/m   Physical Exam  Constitutional: She is oriented to person, place, and time. She appears well-developed and well-nourished. No distress.  HENT:  Head: Normocephalic and atraumatic.  Right Ear: External ear normal.  Left Ear: External ear normal.  Nose: Rhinorrhea present. Right sinus exhibits no maxillary sinus tenderness and no frontal sinus tenderness. Left sinus exhibits no maxillary sinus tenderness and no frontal sinus tenderness.  Mouth/Throat: Oropharynx is clear and moist. No oropharyngeal exudate.  No uvular shifting, peritonsillar abscess or exudates  Eyes: Conjunctivae are normal. Right eye exhibits no discharge. Left eye exhibits no discharge.  Neck: Normal range of motion. Neck supple.  Cardiovascular: Normal rate, regular rhythm and normal heart sounds.   No murmur  heard. Pulmonary/Chest: Effort normal and breath sounds normal. No respiratory distress. She has no wheezes. She has no rales. She exhibits tenderness (tenderness to palpation mid sternal).  Abdominal: Soft. She exhibits no mass. There is no tenderness. There is no guarding.  Musculoskeletal: She exhibits no edema.  Lymphadenopathy:    She has no cervical adenopathy.  Neurological: She is alert and oriented to person, place, and time.  Skin: Skin is warm and dry.  Psychiatric: She has a normal mood and affect.  Nursing note and vitals reviewed.    ED Treatments / Results  Labs (all labs ordered are listed, but only abnormal results are displayed) Labs Reviewed  BASIC METABOLIC PANEL - Abnormal; Notable for the following:       Result Value   Glucose, Bld 100 (*)    Calcium 8.7 (*)    Anion gap 3 (*)    All other components  within normal limits  CBC  TROPONIN I    EKG  EKG Interpretation None       Radiology Dg Chest 2 View  Result Date: 11/30/2016 CLINICAL DATA:  Chest and back pain with shortness of breath. EXAM: CHEST  2 VIEW COMPARISON:  11/24/2015 FINDINGS: Heart size is normal. Mediastinal shadows are normal. The lungs are clear. No bronchial thickening. No infiltrate, mass, effusion or collapse. Pulmonary vascularity is normal. No bony abnormality. IMPRESSION: Normal chest Electronically Signed   By: Paulina FusiMark  Shogry M.D.   On: 11/30/2016 11:39    Procedures Procedures (including critical care time)  Medications Ordered in ED Medications - No data to display   Initial Impression / Assessment and Plan / ED Course  I have reviewed the triage vital signs and the nursing notes.  Pertinent labs & imaging results that were available during my care of the patient were reviewed by me and considered in my medical decision making (see chart for details).     31 year old female with upper respiratory infection. She is given cough medication, decongestant and antihistamine  medication. She will take Tylenol and ibuprofen as needed for sore throat and body aches. She will increase fluids. She is educated on signs and symptoms to return to the ED for.  Final Clinical Impressions(s) / ED Diagnoses   Final diagnoses:  Viral upper respiratory tract infection    New Prescriptions New Prescriptions   BROMPHENIRAMINE-PSEUDOEPHEDRINE-DM 30-2-10 MG/5ML SYRUP    Take 5 mLs by mouth 3 (three) times daily as needed.     Evon Slackhomas C Gaines, PA-C 11/30/16 1334    Jeanmarie PlantJames A McShane, MD 11/30/16 [phone]

## 2016-11-30 NOTE — ED Notes (Signed)
Cough and congestion as well as chest and back pain. Tried OTC meds, no relief.

## 2016-11-30 NOTE — ED Triage Notes (Signed)
Pt reports chest and back pain as well as shortness of breath. Pt states she has had a cough and tried OTC medications. C/o congestion. Pt c/o painful swallowing as well. Denies any fevers at home.

## 2016-11-30 NOTE — Discharge Instructions (Signed)
Please take medications as prescribed. Alternate Tylenol and ibuprofen as needed for pains and body aches. Monitor herself for any fevers, return to the ER for fevers above 101.2. Patient is taking lots of fluids.

## 2016-12-31 ENCOUNTER — Emergency Department
Admission: EM | Admit: 2016-12-31 | Discharge: 2016-12-31 | Disposition: A | Discharge status: Home / Self Care | Payer: Medicaid Other | Attending: Emergency Medicine | Admitting: Emergency Medicine

## 2016-12-31 ENCOUNTER — Encounter: Payer: Self-pay | Admitting: Emergency Medicine

## 2016-12-31 DIAGNOSIS — G4489 Other headache syndrome: Secondary | ICD-10-CM

## 2016-12-31 DIAGNOSIS — R51 Headache: Secondary | ICD-10-CM | POA: Diagnosis present

## 2016-12-31 LAB — URINALYSIS, COMPLETE (UACMP) WITH MICROSCOPIC
BILIRUBIN URINE: NEGATIVE
Bacteria, UA: NONE SEEN
Glucose, UA: NEGATIVE mg/dL
Hgb urine dipstick: NEGATIVE
KETONES UR: NEGATIVE mg/dL
LEUKOCYTES UA: NEGATIVE
Nitrite: NEGATIVE
PH: 7 (ref 5.0–8.0)
PROTEIN: NEGATIVE mg/dL
Specific Gravity, Urine: 1.02 (ref 1.005–1.030)

## 2016-12-31 LAB — POCT PREGNANCY, URINE: Preg Test, Ur: NEGATIVE

## 2016-12-31 MED ORDER — PROCHLORPERAZINE EDISYLATE 5 MG/ML IJ SOLN
10.0000 mg | Freq: Once | INTRAMUSCULAR | Status: AC
  Administered 2016-12-31: 10 mg via INTRAVENOUS
  Filled 2016-12-31: qty 2

## 2016-12-31 MED ORDER — SODIUM CHLORIDE 0.9 % IV BOLUS (SEPSIS)
1000.0000 mL | Freq: Once | INTRAVENOUS | Status: AC
  Administered 2016-12-31: 1000 mL via INTRAVENOUS

## 2016-12-31 MED ORDER — KETOROLAC TROMETHAMINE 30 MG/ML IJ SOLN
15.0000 mg | Freq: Once | INTRAMUSCULAR | Status: AC
  Administered 2016-12-31: 15 mg via INTRAVENOUS
  Filled 2016-12-31: qty 1

## 2016-12-31 NOTE — ED Provider Notes (Signed)
[facility] Emergency Department Provider Note  ____________________________________________   First MD Initiated Contact with Patient 12/31/16 [phone]     (approximate)  I have reviewed the triage vital signs and the nursing notes.   HISTORY  Chief Complaint Headache and Nausea    HPI Lisa Rowland is a 31 y.o. female comes to the emergency department with 1 day of gradual onset not maximal onset right sided headache similar to previous headaches. She's also been somewhat nauseated for the past 2 days but has not vomited.She's had no diarrhea. She has no history of abdominal surgeries. She is passing bowel movements and flatus. She denies chest pain or shortness of breath. She denies numbness or weakness. She denies double vision or blurred vision. Water severity aching throbbing right sided nothing makes it better or worse.   Past Medical History:  Diagnosis Date  . Anemia   . Anxiety   . ASCUS favor benign   . Bipolar 1 disorder (HCC)   . HPV in female   . Hypotension   . Scoliosis     Patient Active Problem List   Diagnosis Date Noted  . Headache, migraine 01/23/2014  . 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 Clinical trial participant 11/25/2013  . Opioid dependence (HCC) 10/27/2013  . Myofascial pain 10/10/2013  . Continuous opioid dependence (HCC) 09/22/2013  . Bipolar affective disorder (HCC) 08/19/2013  . Scoliosis 08/19/2013  . Lesion of vulva 08/02/2013  . HPV test positive 06/08/2013  . Cervical pain 04/03/2012    Past Surgical History:  Procedure Laterality Date  . INDUCED ABORTION      Prior to Admission medications   Medication Sig Start Date End Date Taking? Authorizing Provider  albuterol (PROVENTIL HFA;VENTOLIN HFA) 108 (90 Base) MCG/ACT inhaler Inhale 2 puffs into the lungs every 6 (six) hours as needed.  09/27/14   Historical Provider, MD  brompheniramine-pseudoephedrine-DM 30-2-10 MG/5ML syrup Take 5 mLs by mouth 3  (three) times daily as needed. 11/30/16   Evon Slack, PA-C  clonazePAM (KLONOPIN) 0.5 MG tablet Take 0.5 mg by mouth 2 (two) times daily.     Historical Provider, MD  lamoTRIgine (LAMICTAL) 100 MG tablet Take 100 mg by mouth 2 (two) times daily.    Historical Provider, MD  meloxicam (MOBIC) 15 MG tablet Take 1 tablet (15 mg total) by mouth daily. 09/18/16   Chinita Pester, FNP  methocarbamol (ROBAXIN-750) 750 MG tablet Take 1 tablet (750 mg total) by mouth 4 (four) times daily. 06/23/16   Joni Reining, PA-C  QUEtiapine (SEROQUEL XR) 300 MG 24 hr tablet Take 300 mg by mouth at bedtime.    Historical Provider, MD  traMADol (ULTRAM) 50 MG tablet Take 1 tablet (50 mg total) by mouth every 6 (six) hours as needed. 09/18/16   Chinita Pester, FNP    Allergies Amoxicillin; Bee venom; Mushroom extract complex; and Coconut oil  Family History  Problem Relation Age of Onset  . Osteoarthritis Mother   . Asthma Mother   . Hypertension Mother   . Osteoarthritis Father   . Hypertension Father   . Osteoarthritis Maternal Grandmother   . Asthma Maternal Grandmother   . Cancer Maternal Grandmother     breast cancer  . Diabetes Maternal Grandmother   . Heart failure Maternal Grandmother   . Hyperlipidemia Maternal Grandmother   . Hypertension Maternal Grandmother   . Migraines Maternal Grandmother   . Thyroid disease Maternal Grandmother   . Osteoarthritis Paternal Grandmother   .  Asthma Paternal Grandmother   . Cancer Paternal Grandmother     stomach  . Diabetes Paternal Grandmother   . Heart failure Paternal Grandmother   . Hyperlipidemia Paternal Grandmother   . Hypertension Paternal Grandmother   . Breast cancer Paternal Aunt   . Hypertension Sister     Social History Social History  Substance Use Topics  . Smoking status: Never Smoker  . Smokeless tobacco: Never Used  . Alcohol use No    Review of Systems Constitutional: No fever/chills Eyes: No visual changes. ENT: No sore  throat. Cardiovascular: Denies chest pain. Respiratory: Denies shortness of breath. Gastrointestinal: No abdominal pain.  Positive nausea, no vomiting.  No diarrhea.  No constipation. Genitourinary: Negative for dysuria. Musculoskeletal: Negative for back pain. Skin: Negative for rash. Neurological: Positive for headaches  10-point ROS otherwise negative.  ____________________________________________   PHYSICAL EXAM:  VITAL SIGNS: ED Triage Vitals  Enc Vitals Group     BP 12/31/16 1439 106/60     Pulse Rate 12/31/16 1439 77     Resp 12/31/16 1439 16     Temp 12/31/16 1439 98.7 F (37.1 C)     Temp Source 12/31/16 1439 Oral     SpO2 12/31/16 1439 99 %     Weight 12/31/16 1440 200 lb (90.7 kg)     Height 12/31/16 1440  (1.702 m)     Head Circumference --      Peak Flow --      Pain Score 12/31/16 1438 8     Pain Loc --      Pain Edu? --      Excl. in GC? --     Constitutional: Alert and oriented x 4 well appearing nontoxic no diaphoresis speaks in full, clear sentences Eyes: PERRL EOMI.No papilledema Head: Atraumatic. Nose: No congestion/rhinnorhea. Mouth/Throat: No trismus Neck: No stridor.   Cardiovascular: Normal rate, regular rhythm. Grossly normal heart sounds.  Good peripheral circulation. Respiratory: Normal respiratory effort.  No retractions. Lungs CTAB and moving good air Gastrointestinal: Soft nondistended nontender no rebound no guarding no peritonitis no McBurney's tenderness negative Rovsing's no costovertebral tenderness negative Murphy's Musculoskeletal: No lower extremity edema   Neurologic:  Normal speech and language. No gross focal neurologic deficits are appreciated. Skin:  Skin is warm, dry and intact. No rash noted. Psychiatric: Mood and affect are normal. Speech and behavior are normal.     ____________________________________________   LABS (all labs ordered are listed, but only abnormal results are displayed)  Labs Reviewed    URINALYSIS, COMPLETE (UACMP) WITH MICROSCOPIC - Abnormal; Notable for the following:       Result Value   Color, Urine YELLOW (*)    APPearance HAZY (*)    Squamous Epithelial / LPF 0-5 (*)    All other components within normal limits  POC URINE PREG, ED  POCT PREGNANCY, URINE     __________________________________________  EKG   ____________________________________________  RADIOLOGY   ____________________________________________   PROCEDURES  Procedure(s) performed: no  Procedures  Critical Care performed: no  ____________________________________________   INITIAL IMPRESSION / ASSESSMENT AND PLAN / ED COURSE  Pertinent labs & imaging results that were available during my care of the patient were reviewed by me and considered in my medical decision making (see chart for details).  The patient is very well-appearing with a normal neurological exam and gradual onset not maximal onset headache similar to previous. She has no red flags for headache. Her abdominal exam is also benign.  ____----------------------------------------- 3:50 PM on 12/31/2016 -----------------------------------------  The patient states that her pain is improved and she did not realize it was so late and she has to pick up her children from school and is asking to leave. She is discharged home in good condition. ________________________________________   FINAL CLINICAL IMPRESSION(S) / ED DIAGNOSES  Final diagnoses:  Other headache syndrome      NEW MEDICATIONS STARTED DURING THIS VISIT:  New Prescriptions   No medications on file     Note:  This document was prepared using Dragon voice recognition software and may include unintentional dictation errors.     Merrily Brittle, MD 12/31/16 [phone]

## 2016-12-31 NOTE — ED Triage Notes (Signed)
C/O headache and nausea x 2-3 days.

## 2016-12-31 NOTE — ED Notes (Signed)
Patient stated to this RN "I didn't realize what time it was and I need to leave to go pick up my kids" MD made aware.

## 2016-12-31 NOTE — Discharge Instructions (Signed)
Please take 600 mg of ibuprofen or 1000 mg of Tylenol up to 3 times a day as needed for headache. Return to the emergency department for any new or worsening symptoms. Otherwise follow up with your primary care physician as needed.  It was a pleasure to take care of you today, and thank you for coming to our emergency department.  If you have any questions or concerns before leaving please ask the nurse to grab me and I'm more than happy to go through your aftercare instructions again.  If you were prescribed any opioid pain medication today such as Norco, Vicodin, Percocet, morphine, hydrocodone, or oxycodone please make sure you do not drive when you are taking this medication as it can alter your ability to drive safely.  If you have any concerns once you are home that you are not improving or are in fact getting worse before you can make it to your follow-up appointment, please do not hesitate to call 911 and come back for further evaluation.  Merrily Brittle MD  Results for orders placed or performed during the hospital encounter of 12/31/16  Urinalysis, Complete w Microscopic  Result Value Ref Range   Color, Urine YELLOW (A) YELLOW   APPearance HAZY (A) CLEAR   Specific Gravity, Urine 1.020 1.005 - 1.030   pH 7.0 5.0 - 8.0   Glucose, UA NEGATIVE NEGATIVE mg/dL   Hgb urine dipstick NEGATIVE NEGATIVE   Bilirubin Urine NEGATIVE NEGATIVE   Ketones, ur NEGATIVE NEGATIVE mg/dL   Protein, ur NEGATIVE NEGATIVE mg/dL   Nitrite NEGATIVE NEGATIVE   Leukocytes, UA NEGATIVE NEGATIVE   RBC / HPF 0-5 0 - 5 RBC/hpf   WBC, UA 0-5 0 - 5 WBC/hpf   Bacteria, UA NONE SEEN NONE SEEN   Squamous Epithelial / LPF 0-5 (A) NONE SEEN   Mucous PRESENT   Pregnancy, urine POC  Result Value Ref Range   Preg Test, Ur NEGATIVE NEGATIVE

## 2017-01-20 ENCOUNTER — Emergency Department
Admission: EM | Admit: 2017-01-20 | Discharge: 2017-01-20 | Discharge status: Against Medical Advice | Payer: Medicaid Other

## 2017-01-23 ENCOUNTER — Emergency Department
Admission: EM | Admit: 2017-01-23 | Discharge: 2017-01-24 | Disposition: A | Discharge status: Home / Self Care | Payer: Medicaid Other | Attending: Emergency Medicine | Admitting: Emergency Medicine

## 2017-01-23 ENCOUNTER — Emergency Department: Payer: Medicaid Other

## 2017-01-23 ENCOUNTER — Encounter: Payer: Self-pay | Admitting: Emergency Medicine

## 2017-01-23 DIAGNOSIS — R05 Cough: Secondary | ICD-10-CM | POA: Diagnosis present

## 2017-01-23 DIAGNOSIS — J069 Acute upper respiratory infection, unspecified: Secondary | ICD-10-CM | POA: Insufficient documentation

## 2017-01-23 DIAGNOSIS — B9789 Other viral agents as the cause of diseases classified elsewhere: Secondary | ICD-10-CM

## 2017-01-23 MED ORDER — AZITHROMYCIN 250 MG PO TABS: ORAL_TABLET | ORAL | 0 refills | Status: DC

## 2017-01-23 MED ORDER — BENZONATATE 100 MG PO CAPS: ORAL_CAPSULE | ORAL | 0 refills | Status: DC

## 2017-01-23 MED ORDER — FLUTICASONE PROPIONATE 50 MCG/ACT NA SUSP: 2.0000 | Freq: Every day | NASAL | 0 refills | Status: DC

## 2017-01-23 NOTE — ED Provider Notes (Signed)
[facility] Emergency Department Provider Note ____________________________________________  Time seen: [id number]  I have reviewed the triage vital signs and the nursing notes.  HISTORY  Chief Complaint  Cough  HPI Lisa Rowland is a 31 y.o. female presents to the ED for evaluation of cough for few days with yellow sputum. She reports her symptoms are causing some irritation to her asthma symptoms. She denies any known fevers, chills, sweats.  Past Medical History:  Diagnosis Date  . Anemia   . Anxiety   . ASCUS favor benign   . Bipolar 1 disorder (HCC)   . HPV in female   . Hypotension   . Scoliosis     Patient Active Problem List   Diagnosis Date Noted  . Headache, migraine 01/23/2014  . 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 Clinical trial participant 11/25/2013  . Opioid dependence (HCC) 10/27/2013  . Myofascial pain 10/10/2013  . Continuous opioid dependence (HCC) 09/22/2013  . Bipolar affective disorder (HCC) 08/19/2013  . Scoliosis 08/19/2013  . Lesion of vulva 08/02/2013  . HPV test positive 06/08/2013  . Cervical pain 04/03/2012    Past Surgical History:  Procedure Laterality Date  . INDUCED ABORTION      Prior to Admission medications   Medication Sig Start Date End Date Taking? Authorizing Provider  albuterol (PROVENTIL HFA;VENTOLIN HFA) 108 (90 Base) MCG/ACT inhaler Inhale 2 puffs into the lungs every 6 (six) hours as needed.  09/27/14   [provider]  brompheniramine-pseudoephedrine-DM 30-2-10 MG/5ML syrup Take 5 mLs by mouth 3 (three) times daily as needed. 11/30/16   Evon SlackGaines, Thomas C, PA-C  clonazePAM (KLONOPIN) 0.5 MG tablet Take 0.5 mg by mouth 2 (two) times daily.     [provider]  lamoTRIgine (LAMICTAL) 100 MG tablet Take 100 mg by mouth 2 (two) times daily.    [provider]  meloxicam (MOBIC) 15 MG tablet Take 1 tablet (15 mg total) by mouth daily. 09/18/16   Triplett, Rulon Eisenmengerari B, FNP   methocarbamol (ROBAXIN-750) 750 MG tablet Take 1 tablet (750 mg total) by mouth 4 (four) times daily. 06/23/16   Joni ReiningSmith, Ronald K, PA-C  QUEtiapine (SEROQUEL XR) 300 MG 24 hr tablet Take 300 mg by mouth at bedtime.    [provider]  traMADol (ULTRAM) 50 MG tablet Take 1 tablet (50 mg total) by mouth every 6 (six) hours as needed. 09/18/16   Triplett, Rulon Eisenmengerari B, FNP    Allergies Amoxicillin; Bee venom; Mushroom extract complex; and Coconut oil  Family History  Problem Relation Age of Onset  . Osteoarthritis Mother   . Asthma Mother   . Hypertension Mother   . Osteoarthritis Father   . Hypertension Father   . Osteoarthritis Maternal Grandmother   . Asthma Maternal Grandmother   . Cancer Maternal Grandmother        breast cancer  . Diabetes Maternal Grandmother   . Heart failure Maternal Grandmother   . Hyperlipidemia Maternal Grandmother   . Hypertension Maternal Grandmother   . Migraines Maternal Grandmother   . Thyroid disease Maternal Grandmother   . Osteoarthritis Paternal Grandmother   . Asthma Paternal Grandmother   . Cancer Paternal Grandmother        stomach  . Diabetes Paternal Grandmother   . Heart failure Paternal Grandmother   . Hyperlipidemia Paternal Grandmother   . Hypertension Paternal Grandmother   . Breast cancer Paternal Aunt   . Hypertension Sister     Social History Social History  Substance  Use Topics  . Smoking status: Never Smoker  . Smokeless tobacco: Never Used  . Alcohol use No    Review of Systems  Constitutional: Negative for fever. Eyes: Negative for visual changes. ENT: Negative for sore throat. Cardiovascular: Negative for chest pain. Respiratory: Negative for shortness of breath. Reports intermittently productive cough as above. Gastrointestinal: Negative for abdominal pain, vomiting and diarrhea. ____________________________________________  PHYSICAL EXAM:  VITAL SIGNS: ED Triage Vitals [01/23/17 2049]  Enc Vitals Group      BP (!) 114/94     Pulse Rate 85     Resp 18     Temp 99.2 F (37.3 C)     Temp Source Oral     SpO2 100 %     Weight 208 lb (94.3 kg)     Height 5\' 7"  (1.702 m)     Head Circumference      Peak Flow      Pain Score 8     Pain Loc      Pain Edu?      Excl. in GC?     Constitutional: Alert and oriented. Well appearing and in no distress. Head: Normocephalic and atraumatic. Eyes: Conjunctivae are normal. PERRL. Normal extraocular movements Ears: Canals clear. TMs intact bilaterally. Nose: No congestion/rhinorrhea/epistaxis. Mouth/Throat: Mucous membranes are moist. Uvula is midline and tonsils are flat. Neck: Supple. No thyromegaly. Hematological/Lymphatic/Immunological: No cervical lymphadenopathy. Cardiovascular: Normal rate, regular rhythm. Normal distal pulses. Respiratory: Normal respiratory effort. No wheezes/rales/rhonchi. Gastrointestinal: Soft and nontender. No distention. ___________________________________________   RADIOLOGY  CXR  IMPRESSION: No acute abnormality. No evidence of pneumonia or findings to suggest asthma exacerbation. ____________________________________________  INITIAL IMPRESSION / ASSESSMENT AND PLAN / ED COURSE  Patient with the symptom consistent with a probable viral URI with cough. Exam is otherwise benign and her chest x-ray is negative for any acute findings. She'll be discharged at this time prescription for Flonase, Tessalon Perles, and a Zithromax dose as needed. She will follow with the primary care provider for worsening symptoms or return to the ED as needed. ____________________________________________  FINAL CLINICAL IMPRESSION(S) / ED DIAGNOSES  Final diagnoses:  Viral URI with cough     Karmen Stabs, Charlesetta Ivory, PA-C 01/26/17 1845    Emily Filbert, MD 01/29/17 1246

## 2017-01-23 NOTE — ED Notes (Signed)
Patient is requesting not to be prescribed same cough medication as on prior visit due to price.

## 2017-01-23 NOTE — Discharge Instructions (Addendum)
Take the prescription meds as directed. Continue to dose your OTC Mucinex for loosening phlegm. Drink plenty of fluids and follow-up with your provider.

## 2017-01-23 NOTE — ED Triage Notes (Signed)
Pt ambulatory to triage in NAD, report cough x couple days with yellow sputum, states irritating asthma, 100% on RA in triage.

## 2017-01-24 MED ORDER — AZITHROMYCIN 200 MG/5ML PO SUSR: ORAL | 0 refills | Status: AC

## 2017-01-24 NOTE — ED Notes (Signed)
Reviewed d/c instructions, follow-up care, prescriptions with patient. Pt verbalized understanding.  

## 2017-01-27 ENCOUNTER — Encounter: Payer: Self-pay | Admitting: *Deleted

## 2017-01-27 ENCOUNTER — Emergency Department: Payer: Medicaid Other

## 2017-01-27 DIAGNOSIS — Z79899 Other long term (current) drug therapy: Secondary | ICD-10-CM | POA: Diagnosis not present

## 2017-01-27 DIAGNOSIS — R05 Cough: Secondary | ICD-10-CM | POA: Diagnosis present

## 2017-01-27 DIAGNOSIS — R0602 Shortness of breath: Secondary | ICD-10-CM | POA: Insufficient documentation

## 2017-01-27 DIAGNOSIS — Z5321 Procedure and treatment not carried out due to patient leaving prior to being seen by health care provider: Secondary | ICD-10-CM | POA: Diagnosis not present

## 2017-01-27 NOTE — ED Triage Notes (Signed)
Pt complains of a cough for 1 month, pt seen on 5/11 took last antibiotic today, pt complains of cough and shortness of breath, pt denies pain and any other symptoms

## 2017-01-28 ENCOUNTER — Emergency Department
Admission: EM | Admit: 2017-01-28 | Discharge: 2017-01-28 | Disposition: A | Discharge status: Home / Self Care | Payer: Medicaid Other | Attending: Emergency Medicine | Admitting: Emergency Medicine

## 2017-03-16 ENCOUNTER — Ambulatory Visit
Admission: EM | Admit: 2017-03-16 | Discharge: 2017-03-16 | Disposition: A | Discharge status: Home / Self Care | Payer: Medicaid Other | Attending: Family Medicine | Admitting: Family Medicine

## 2017-03-16 DIAGNOSIS — Z880 Allergy status to penicillin: Secondary | ICD-10-CM | POA: Insufficient documentation

## 2017-03-16 DIAGNOSIS — G8929 Other chronic pain: Secondary | ICD-10-CM | POA: Diagnosis not present

## 2017-03-16 DIAGNOSIS — F419 Anxiety disorder, unspecified: Secondary | ICD-10-CM | POA: Insufficient documentation

## 2017-03-16 DIAGNOSIS — R079 Chest pain, unspecified: Secondary | ICD-10-CM | POA: Diagnosis not present

## 2017-03-16 DIAGNOSIS — N644 Mastodynia: Secondary | ICD-10-CM | POA: Diagnosis not present

## 2017-03-16 DIAGNOSIS — M79609 Pain in unspecified limb: Secondary | ICD-10-CM | POA: Diagnosis not present

## 2017-03-16 DIAGNOSIS — R300 Dysuria: Secondary | ICD-10-CM | POA: Insufficient documentation

## 2017-03-16 DIAGNOSIS — A5901 Trichomonal vulvovaginitis: Secondary | ICD-10-CM | POA: Diagnosis not present

## 2017-03-16 DIAGNOSIS — Z825 Family history of asthma and other chronic lower respiratory diseases: Secondary | ICD-10-CM | POA: Diagnosis not present

## 2017-03-16 DIAGNOSIS — M549 Dorsalgia, unspecified: Secondary | ICD-10-CM | POA: Insufficient documentation

## 2017-03-16 DIAGNOSIS — Z8249 Family history of ischemic heart disease and other diseases of the circulatory system: Secondary | ICD-10-CM | POA: Insufficient documentation

## 2017-03-16 DIAGNOSIS — M419 Scoliosis, unspecified: Secondary | ICD-10-CM | POA: Diagnosis not present

## 2017-03-16 DIAGNOSIS — Z803 Family history of malignant neoplasm of breast: Secondary | ICD-10-CM | POA: Insufficient documentation

## 2017-03-16 DIAGNOSIS — M25511 Pain in right shoulder: Secondary | ICD-10-CM

## 2017-03-16 DIAGNOSIS — Z8261 Family history of arthritis: Secondary | ICD-10-CM | POA: Insufficient documentation

## 2017-03-16 DIAGNOSIS — R0602 Shortness of breath: Secondary | ICD-10-CM | POA: Insufficient documentation

## 2017-03-16 DIAGNOSIS — Z888 Allergy status to other drugs, medicaments and biological substances status: Secondary | ICD-10-CM | POA: Diagnosis not present

## 2017-03-16 DIAGNOSIS — G43909 Migraine, unspecified, not intractable, without status migrainosus: Secondary | ICD-10-CM | POA: Insufficient documentation

## 2017-03-16 DIAGNOSIS — M791 Myalgia: Secondary | ICD-10-CM | POA: Diagnosis not present

## 2017-03-16 DIAGNOSIS — Z833 Family history of diabetes mellitus: Secondary | ICD-10-CM | POA: Diagnosis not present

## 2017-03-16 DIAGNOSIS — M542 Cervicalgia: Secondary | ICD-10-CM | POA: Diagnosis not present

## 2017-03-16 DIAGNOSIS — F1120 Opioid dependence, uncomplicated: Secondary | ICD-10-CM | POA: Insufficient documentation

## 2017-03-16 DIAGNOSIS — F319 Bipolar disorder, unspecified: Secondary | ICD-10-CM | POA: Insufficient documentation

## 2017-03-16 DIAGNOSIS — N61 Mastitis without abscess: Secondary | ICD-10-CM | POA: Diagnosis not present

## 2017-03-16 DIAGNOSIS — R109 Unspecified abdominal pain: Secondary | ICD-10-CM | POA: Diagnosis not present

## 2017-03-16 DIAGNOSIS — N912 Amenorrhea, unspecified: Secondary | ICD-10-CM | POA: Insufficient documentation

## 2017-03-16 DIAGNOSIS — N926 Irregular menstruation, unspecified: Secondary | ICD-10-CM | POA: Diagnosis not present

## 2017-03-16 DIAGNOSIS — Z79899 Other long term (current) drug therapy: Secondary | ICD-10-CM | POA: Insufficient documentation

## 2017-03-16 DIAGNOSIS — Z800 Family history of malignant neoplasm of digestive organs: Secondary | ICD-10-CM | POA: Insufficient documentation

## 2017-03-16 LAB — HCG, QUANTITATIVE, PREGNANCY: hCG, Beta Chain, Quant, S: <1 m[IU]/mL (ref <5)

## 2017-03-16 LAB — PREGNANCY, URINE: Preg Test, Ur: NEGATIVE

## 2017-03-16 MED ORDER — SULFAMETHOXAZOLE-TRIMETHOPRIM 800-160 MG PO TABS: 1.0000 | ORAL_TABLET | Freq: Two times a day (BID) | ORAL | 0 refills | Status: AC

## 2017-03-16 MED ORDER — PREDNISONE 10 MG PO TABS: ORAL_TABLET | ORAL | 0 refills | Status: DC

## 2017-03-16 NOTE — ED Provider Notes (Signed)
[facility] ____________________________________________  Time seen: Approximately 332 PM  I have reviewed the triage vital signs and the nursing notes.   HISTORY  Chief Complaint Shoulder Pain (right); Amenorrhea; and Breast Problem (left)   HPI Lisa Rowland is a 31 y.o. female presenting for evaluation of multiple medical complaints. Patient states she has been having pain to her right shoulder for the last 4-5 days. Denies any trauma, no injury, changed repetitive movements or known trigger. States Some shoulder differently, but denies knowingly. States no pain at rest or with palpation but reports pain is with active range of motion. Denies any paresthesias or pain radiation. Denies any decrease hand or grip strength. Denies redness, swelling or skin changes. Denies history of similar in the past. States has been taken her  daily naproxen without much change. States she's been on naproxen chronically for chronic back pain. States no change in her chronic back pain.  Patient also presenting for evaluation of left breast tenderness and sensation of heaviness. Patient states that she noticed some redness to skin. Patient states that this has been an issue for 2-3 weeks. Denies any insect bites, tick bite or tick attachment. Denies any accompanying fevers. Not nursing. Denies trauma. Denies any drainage, bleeding or discharge. Denies history of similar in past. Denies aggravating or alleviating events. Has not taken any over-the-counter medications for same complaints.  Patient also presenting for evaluation of absence of period. Patient states that she was previously on Depo-Provera and last injection was at the end of 2017. Patient reports that she began having normal menstrual was in February and continue to have monthly cycles for the last 2 months. States her last menstrual was at the beginning of May. Patient expresses concern of pregnancy as she is currently sexually active  and not using barrier contraception or oral contraception. Patient states that she is not actively trying to become pregnant but not actually preventing. Reports gravida 5 para 3 miscarriage 2. Patient denies any dysuria, vaginal complaints, vaginal discharge, concerns of STDs, abdominal pain, atypical back pain or other complaints.  Den ies chest pain, shortness of breath, abdominal pain, dysuria, extremity pain, extremity swelling or rash. Denies recent sickness. Denies recent antibiotic use. Denies cardiac history. Denies renal insufficiency.  Patient's last menstrual period was 01/14/2017.   Past Medical History:  Diagnosis Date  . Anemia   . Anxiety   . ASCUS favor benign   . Bipolar 1 disorder (HCC)   . HPV in female   . Hypotension   . Scoliosis     Patient Active Problem List   Diagnosis Date Noted  . Headache, migraine 01/23/2014  . 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 Clinical trial participant 11/25/2013  . Opioid dependence (HCC) 10/27/2013  . Myofascial pain 10/10/2013  . Continuous opioid dependence (HCC) 09/22/2013  . Bipolar affective disorder (HCC) 08/19/2013  . Scoliosis 08/19/2013  . Lesion of vulva 08/02/2013  . HPV test positive 06/08/2013  . Cervical pain 04/03/2012    Past Surgical History:  Procedure Laterality Date  . INDUCED ABORTION       No current facility-administered medications for this encounter.   Current Outpatient Prescriptions:  .  albuterol (PROVENTIL HFA;VENTOLIN HFA) 108 (90 Base) MCG/ACT inhaler, Inhale 2 puffs into the lungs every 6 (six) hours as needed. , Disp: , Rfl:  .  ALPRAZolam (XANAX) 0.5 MG tablet, Take 0.5 mg by mouth 2 (two) times daily as needed for anxiety., Disp: , Rfl:  .  fluticasone (  FLONASE) 50 MCG/ACT nasal spray, Place 2 sprays into both nostrils daily., Disp: 16 g, Rfl: 0 .  lamoTRIgine (LAMICTAL) 100 MG tablet, Take 100 mg by mouth 2 (two) times daily., Disp: , Rfl:  .  naproxen (NAPROSYN) 500 MG  tablet, Take 500 mg by mouth 2 (two) times daily with a meal., Disp: , Rfl:  .  QUEtiapine (SEROQUEL XR) 300 MG 24 hr tablet, Take 300 mg by mouth at bedtime., Disp: , Rfl:  .  benzonatate (TESSALON PERLES) 100 MG capsule, Take 1-2 tabs TID prn cough, Disp: 30 capsule, Rfl: 0 .  brompheniramine-pseudoephedrine-DM 30-2-10 MG/5ML syrup, Take 5 mLs by mouth 3 (three) times daily as needed., Disp: 60 mL, Rfl: 0 .  clonazePAM (KLONOPIN) 0.5 MG tablet, Take 0.5 mg by mouth 2 (two) times daily. , Disp: , Rfl:  .  meloxicam (MOBIC) 15 MG tablet, Take 1 tablet (15 mg total) by mouth daily., Disp: 30 tablet, Rfl: 0 .  methocarbamol (ROBAXIN-750) 750 MG tablet, Take 1 tablet (750 mg total) by mouth 4 (four) times daily., Disp: 20 tablet, Rfl: 0 .  predniSONE (DELTASONE) 10 MG tablet, Start 60 mg po day one, then 50 mg po day two, taper by 10 mg daily until complete., Disp: 21 tablet, Rfl: 0 .  sulfamethoxazole-trimethoprim (BACTRIM DS,SEPTRA DS) 800-160 MG tablet, Take 1 tablet by mouth 2 (two) times daily., Disp: 20 tablet, Rfl: 0 .  traMADol (ULTRAM) 50 MG tablet, Take 1 tablet (50 mg total) by mouth every 6 (six) hours as needed., Disp: 12 tablet, Rfl: 0  Allergies Amoxicillin; Bee venom; Mushroom extract complex; and Coconut oil  Family History  Problem Relation Age of Onset  . Osteoarthritis Mother   . Asthma Mother   . Hypertension Mother   . Osteoarthritis Father   . Hypertension Father   . Osteoarthritis Maternal Grandmother   . Asthma Maternal Grandmother   . Cancer Maternal Grandmother        breast cancer  . Diabetes Maternal Grandmother   . Heart failure Maternal Grandmother   . Hyperlipidemia Maternal Grandmother   . Hypertension Maternal Grandmother   . Migraines Maternal Grandmother   . Thyroid disease Maternal Grandmother   . Osteoarthritis Paternal Grandmother   . Asthma Paternal Grandmother   . Cancer Paternal Grandmother        stomach  . Diabetes Paternal Grandmother   .  Heart failure Paternal Grandmother   . Hyperlipidemia Paternal Grandmother   . Hypertension Paternal Grandmother   . Breast cancer Paternal Aunt   . Hypertension Sister     Social History Social History  Substance Use Topics  . Smoking status: Never Smoker  . Smokeless tobacco: Never Used  . Alcohol use No    Review of Systems Constitutional: No fever/chills Cardiovascular: Denies chest pain. Respiratory: Denies shortness of breath. Gastrointestinal: No abdominal pain.  No nausea, no vomiting.  No diarrhea.  No constipation. Genitourinary: Negative for dysuria. Musculoskeletal: Negative for back pain. Skin : As above    ____________________________________________   PHYSICAL EXAM:  VITAL SIGNS: ED Triage Vitals  Enc Vitals Group     BP 03/16/17 1456 (!) 106/58     Pulse Rate 03/16/17 1456 66     Resp 03/16/17 1456 18     Temp 03/16/17 1456 98.6 F (37 C)     Temp Source 03/16/17 1456 Oral     SpO2 03/16/17 1456 100 %     Weight 03/16/17 1455 190 lb (86.2 kg)  Height 03/16/17 1455 5\' 7"  (1.702 m)     Head Circumference --      Peak Flow --      Pain Score 03/16/17 1456 8     Pain Loc --      Pain Edu? --      Excl. in GC? --     Constitutional: Alert and oriented. Well appearing and in no acute distress. Eyes: Conjunctivae are normal.  ENT      Head: Normocephalic and atraumatic.      Nose: No congestion/rhinnorhea.      Mouth/Throat: Mucous membranes are moist. Oropharynx non-erythematous. Cardiovascular: Normal rate, regular rhythm. Grossly normal heart sounds.  Good peripheral circulation. Respiratory: Normal respiratory effort without tachypnea nor retractions. Breath sounds are clear and equal bilaterally. No wheezes, rales, rhonchi. Gastrointestinal: Soft and nontender. No CVA tenderness. Musculoskeletal: No midline cervical, thoracic or lumbar tenderness to palpation. Bilateral distal radial pulses equal and easily palpated. Except: right shoulder  nontender to palpation, no erythema, no edema, no point bony tenderness, right arm nontender, sensation intact to bilateral upper extremities, bilateral hand grips equal, thumb to each finger touch symmetrical bilaterally, no ataxia, no chest or rib tenderness to palpation. Right shoulder pain per patient with lateral abduction, patient able to abduct greater than 90, negative drop arm test, negative empty can test.  Neurologic:  Normal speech and language. Speech is normal. No gait instability.  Skin:  Skin is warm, dry. Except: Breast exam-patient declines chaperone. Bilateral breasts appear symmetrical, except for minimal swelling to left breast at approximately 12:00 with associated mild erythema and mild tenderness to direct palpation localized around 12:00 , no induration, no drainage, no palpated nodules or mass noted bilaterally. No nipple discharge noted bilaterally.  Psychiatric: Mood and affect are normal. Speech and behavior are normal. Patient exhibits appropriate insight and judgment   ___________________________________________   LABS (all labs ordered are listed, but only abnormal results are displayed)  Labs Reviewed  PREGNANCY, URINE  HCG, QUANTITATIVE, PREGNANCY    RADIOLOGY  No results found. ____________________________________________   PROCEDURES Procedures    INITIAL IMPRESSION / ASSESSMENT AND PLAN / ED COURSE  Pertinent labs & imaging results that were available during my care of the patient were reviewed by me and considered in my medical decision making (see chart for details).  Very well-appearing patient. No acute distress. Right shoulder pain in the last several days gradually worsening not resolved with home naproxen. No point bony tenderness and patient declines trauma. Does report she does a lot of movement with right arm on a daily basis. Suspect inflammatory pain and tendinitis. Has no point bony tenderness and no trauma, discussed evaluation of  x-ray, patient declines x-ray at this time and prefer to try medication management initially. Discussed with patient we'll start patient on oral prednisone, encouraged supportive care, stretching and close follow-up if pain continues and imaging at that time.  Urine pregnancy negative. Patient expressed concern of previous pregnancies did not show in urine. Serum hCG evaluated and was negative. Counseled regarding safe sex of patient not actively trying to conceive, patient states that she does not want to conceive nail in total other medications are changed prior to pregnancy. Discussed with patient to follow-up closely with OB/GYN regarding absence of menstruation. Patient again declines any vaginal complaints, dysuria or change in back pain or abdominal pain. Encouraged follow-up and discussed her follow-up and return parameters.  Breast complaints, and absence of current pregnancy, concerning for non-lactational mastitis. This in  detail with patient to closely monitor area and a half follow-up if symptoms do not resolve and may need further evaluation imaging at that time. Discussed in detail with patient we'll empirically treat patient with oral Bactrim and encouraged good supportive bras and supportive care. Discussed close monitoring.Discussed indication, risks and benefits of medications with patient.  Discussed follow up with Primary care physician this week. Discussed follow up and return parameters including no resolution or any worsening concerns. Patient verbalized understanding and agreed to plan.   ____________________________________________   FINAL CLINICAL IMPRESSION(S) / ED DIAGNOSES  Final diagnoses:  Acute pain of right shoulder  Breast tenderness in female  Acute mastitis of left breast  Menstrual abnormality     Discharge Medication List as of 03/16/2017  4:39 PM    START taking these medications   Details  predniSONE (DELTASONE) 10 MG tablet Start 60 mg po day one, then  50 mg po day two, taper by 10 mg daily until complete., Normal    sulfamethoxazole-trimethoprim (BACTRIM DS,SEPTRA DS) 800-160 MG tablet Take 1 tablet by mouth 2 (two) times daily., Starting Mon 03/16/2017, Until Thu 03/26/2017, Normal        Note: This dictation was prepared with Dragon dictation along with smaller phrase technology. Any transcriptional errors that result from this process are unintentional.         Renford Dills, NP 03/16/17 2128

## 2017-03-16 NOTE — ED Triage Notes (Addendum)
Patient complains of right shoulder pain that is worse when patient is lifting her arm up. Patient states that pain started on Saturday and has been worsening. Patient states that she has also not had a menstrual cycle since the beginning of may. Patient states that previous pregnancy never show up in urine hcg but do with blood work. Patient also reports that she is having some left breast swelling that she has noticed coming out of her bra.

## 2017-03-16 NOTE — Discharge Instructions (Signed)
Take medication as prescribed. Rest. Drink plenty of fluids. Wear good supportive bras.   Follow up with your primary care physician or OBGYN this week as discussed. Return to Urgent care for new or worsening concerns.

## 2017-05-11 ENCOUNTER — Other Ambulatory Visit: Payer: Self-pay | Admitting: Family Medicine

## 2017-05-11 DIAGNOSIS — N63 Unspecified lump in unspecified breast: Secondary | ICD-10-CM

## 2017-05-13 ENCOUNTER — Inpatient Hospital Stay: Admission: RE | Admit: 2017-05-13 | Payer: Medicaid Other | Source: Ambulatory Visit

## 2017-05-18 ENCOUNTER — Emergency Department
Admission: EM | Admit: 2017-05-18 | Discharge: 2017-05-18 | Disposition: A | Discharge status: Home / Self Care | Payer: Medicaid Other | Attending: Student in an Organized Health Care Education/Training Program | Admitting: Student in an Organized Health Care Education/Training Program

## 2017-05-18 ENCOUNTER — Encounter: Payer: Self-pay | Admitting: Emergency Medicine

## 2017-05-18 DIAGNOSIS — Z3A01 Less than 8 weeks gestation of pregnancy: Secondary | ICD-10-CM

## 2017-05-18 DIAGNOSIS — Z79899 Other long term (current) drug therapy: Secondary | ICD-10-CM | POA: Diagnosis not present

## 2017-05-18 DIAGNOSIS — R110 Nausea: Secondary | ICD-10-CM

## 2017-05-18 DIAGNOSIS — O219 Vomiting of pregnancy, unspecified: Secondary | ICD-10-CM | POA: Insufficient documentation

## 2017-05-18 LAB — CBC
HEMATOCRIT: 37.2 % (ref 35.0–47.0)
Hemoglobin: 12.4 g/dL (ref 12.0–16.0)
MCH: 28 pg (ref 26.0–34.0)
MCHC: 33.4 g/dL (ref 32.0–36.0)
MCV: 84 fL (ref 80.0–100.0)
PLATELETS: 236 10*3/uL (ref 150–440)
RBC: 4.43 MIL/uL (ref 3.80–5.20)
RDW: 13.6 % (ref 11.5–14.5)
WBC: 5.8 10*3/uL (ref 3.6–11.0)

## 2017-05-18 LAB — COMPREHENSIVE METABOLIC PANEL
ALBUMIN: 4 g/dL (ref 3.5–5.0)
ALK PHOS: 40 U/L (ref 38–126)
ALT: 10 U/L — AB (ref 14–54)
AST: 15 U/L (ref 15–41)
Anion gap: 6 (ref 5–15)
BUN: 10 mg/dL (ref 6–20)
CHLORIDE: 106 mmol/L (ref 101–111)
CO2: 26 mmol/L (ref 22–32)
CREATININE: 0.73 mg/dL (ref 0.44–1.00)
Calcium: 9 mg/dL (ref 8.9–10.3)
GFR calc Af Amer: >60 mL/min (ref >60)
GLUCOSE: 101 mg/dL — AB (ref 65–99)
Potassium: 3.9 mmol/L (ref 3.5–5.1)
SODIUM: 138 mmol/L (ref 135–145)
Total Bilirubin: 1 mg/dL (ref 0.3–1.2)
Total Protein: 7.7 g/dL (ref 6.5–8.1)

## 2017-05-18 LAB — URINALYSIS, COMPLETE (UACMP) WITH MICROSCOPIC
BACTERIA UA: NONE SEEN
Bilirubin Urine: NEGATIVE
Glucose, UA: NEGATIVE mg/dL
Hgb urine dipstick: NEGATIVE
Ketones, ur: NEGATIVE mg/dL
Leukocytes, UA: NEGATIVE
Nitrite: NEGATIVE
Protein, ur: NEGATIVE mg/dL
SPECIFIC GRAVITY, URINE: 1.025 (ref 1.005–1.030)
WBC, UA: NONE SEEN WBC/hpf (ref 0–5)
pH: 6 (ref 5.0–8.0)

## 2017-05-18 LAB — POCT PREGNANCY, URINE: PREG TEST UR: POSITIVE — AB

## 2017-05-18 LAB — HCG, QUANTITATIVE, PREGNANCY: HCG, BETA CHAIN, QUANT, S: 1156 m[IU]/mL — AB (ref <5)

## 2017-05-18 LAB — LIPASE, BLOOD: LIPASE: 24 U/L (ref 11–51)

## 2017-05-18 MED ORDER — DOXYLAMINE-PYRIDOXINE 10-10 MG PO TBEC: 1.0000 | DELAYED_RELEASE_TABLET | Freq: Two times a day (BID) | ORAL | 1 refills | Status: DC

## 2017-05-18 NOTE — ED Provider Notes (Signed)
[facility] Emergency Department Provider Note    First MD Initiated Contact with Patient 05/18/17 1405     (approximate)  I have reviewed the triage vital signs and the nursing notes.   HISTORY  Chief Complaint Nausea    HPI Lisa Rowland is a 31 y.o. female presents with chief complaint of several days of nausea. No vomiting. Denies any pain. States that she felt similar to this in her last pregnancy. Since her last cycle was admitted in the of June. Had a dip a shot in October of last year.denies any fevers. No dysuria or vaginal bleeding. Shortness of breath or chest pain.   Past Medical History:  Diagnosis Date  . Anemia   . Anxiety   . ASCUS favor benign   . Bipolar 1 disorder (HCC)   . HPV in female   . Hypotension   . Scoliosis    Family History  Problem Relation Age of Onset  . Osteoarthritis Mother   . Asthma Mother   . Hypertension Mother   . Osteoarthritis Father   . Hypertension Father   . Osteoarthritis Maternal Grandmother   . Asthma Maternal Grandmother   . Cancer Maternal Grandmother        breast cancer  . Diabetes Maternal Grandmother   . Heart failure Maternal Grandmother   . Hyperlipidemia Maternal Grandmother   . Hypertension Maternal Grandmother   . Migraines Maternal Grandmother   . Thyroid disease Maternal Grandmother   . Osteoarthritis Paternal Grandmother   . Asthma Paternal Grandmother   . Cancer Paternal Grandmother        stomach  . Diabetes Paternal Grandmother   . Heart failure Paternal Grandmother   . Hyperlipidemia Paternal Grandmother   . Hypertension Paternal Grandmother   . Breast cancer Paternal Aunt   . Hypertension Sister    Past Surgical History:  Procedure Laterality Date  . INDUCED ABORTION     Patient Active Problem List   Diagnosis Date Noted  . Headache, migraine 01/23/2014  . 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 Clinical trial participant 11/25/2013  . Opioid dependence  (HCC) 10/27/2013  . Myofascial pain 10/10/2013  . Continuous opioid dependence (HCC) 09/22/2013  . Bipolar affective disorder (HCC) 08/19/2013  . Scoliosis 08/19/2013  . Lesion of vulva 08/02/2013  . HPV test positive 06/08/2013  . Cervical pain 04/03/2012      Prior to Admission medications   Medication Sig Start Date End Date Taking? Authorizing Provider  albuterol (PROVENTIL HFA;VENTOLIN HFA) 108 (90 Base) MCG/ACT inhaler Inhale 2 puffs into the lungs every 6 (six) hours as needed.  09/27/14   [provider]  ALPRAZolam Prudy Feeler) 0.5 MG tablet Take 0.5 mg by mouth 2 (two) times daily as needed for anxiety.    [provider]  benzonatate (TESSALON PERLES) 100 MG capsule Take 1-2 tabs TID prn cough 01/23/17   Menshew, Charlesetta Ivory, PA-C  brompheniramine-pseudoephedrine-DM 30-2-10 MG/5ML syrup Take 5 mLs by mouth 3 (three) times daily as needed. 11/30/16   Evon Slack, PA-C  clonazePAM (KLONOPIN) 0.5 MG tablet Take 0.5 mg by mouth 2 (two) times daily.     [provider]  Doxylamine-Pyridoxine 10-10 MG TBEC Take 1 tablet by mouth 2 (two) times daily. 05/18/17   Willy Eddy, MD  fluticasone (FLONASE) 50 MCG/ACT nasal spray Place 2 sprays into both nostrils daily. 01/23/17   Menshew, Charlesetta Ivory, PA-C  lamoTRIgine (LAMICTAL) 100 MG tablet Take 100 mg by  mouth 2 (two) times daily.    [provider]  meloxicam (MOBIC) 15 MG tablet Take 1 tablet (15 mg total) by mouth daily. 09/18/16   Triplett, Rulon Eisenmenger B, FNP  methocarbamol (ROBAXIN-750) 750 MG tablet Take 1 tablet (750 mg total) by mouth 4 (four) times daily. 06/23/16   Joni Reining, PA-C  naproxen (NAPROSYN) 500 MG tablet Take 500 mg by mouth 2 (two) times daily with a meal.    [provider]  predniSONE (DELTASONE) 10 MG tablet Start 60 mg po day one, then 50 mg po day two, taper by 10 mg daily until complete. 03/16/17   Renford Dills, NP  QUEtiapine (SEROQUEL XR) 300 MG 24 hr tablet Take  300 mg by mouth at bedtime.    [provider]  traMADol (ULTRAM) 50 MG tablet Take 1 tablet (50 mg total) by mouth every 6 (six) hours as needed. 09/18/16   Triplett, Rulon Eisenmenger B, FNP    Allergies Amoxicillin; Bee venom; Mushroom extract complex; and Coconut oil    Social History Social History  Substance Use Topics  . Smoking status: Never Smoker  . Smokeless tobacco: Never Used  . Alcohol use No    Review of Systems Patient denies headaches, rhinorrhea, blurry vision, numbness, shortness of breath, chest pain, edema, cough, abdominal pain, nausea, vomiting, diarrhea, dysuria, fevers, rashes or hallucinations unless otherwise stated above in HPI. ____________________________________________   PHYSICAL EXAM:  VITAL SIGNS: Vitals:   05/18/17 1054 05/18/17 1405  BP: 107/65 120/66  Pulse: 70 72  Resp: 18 16  Temp: 98.8 F (37.1 C)   SpO2: 100% 100%    Constitutional: Alert and oriented. Well appearing and in no acute distress. Eyes: Conjunctivae are normal.  Head: Atraumatic. Nose: No congestion/rhinnorhea. Mouth/Throat: Mucous membranes are moist.   Neck: Painless ROM.  Cardiovascular:   Good peripheral circulation. Respiratory: Normal respiratory effort.  No retractions.  Gastrointestinal: Soft and nontender.  Musculoskeletal: No lower extremity tenderness .  No joint effusions. Neurologic:  Normal speech and language. No gross focal neurologic deficits are appreciated.  Skin:  Skin is warm, dry and intact. No rash noted. Psychiatric: Mood and affect are normal. Speech and behavior are normal.  ____________________________________________   LABS (all labs ordered are listed, but only abnormal results are displayed)  Results for orders placed or performed during the hospital encounter of 05/18/17 (from the past 24 hour(s))  Lipase, blood     Status: None   Collection Time: 05/18/17 10:58 AM  Result Value Ref Range   Lipase 24 11 - 51 U/L  Comprehensive  metabolic panel     Status: Abnormal   Collection Time: 05/18/17 10:58 AM  Result Value Ref Range   Sodium 138 135 - 145 mmol/L   Potassium 3.9 3.5 - 5.1 mmol/L   Chloride 106 101 - 111 mmol/L   CO2 26 22 - 32 mmol/L   Glucose, Bld 101 (H) 65 - 99 mg/dL   BUN 10 6 - 20 mg/dL   Creatinine, Ser 1.61 0.44 - 1.00 mg/dL   Calcium 9.0 8.9 - 09.6 mg/dL   Total Protein 7.7 6.5 - 8.1 g/dL   Albumin 4.0 3.5 - 5.0 g/dL   AST 15 15 - 41 U/L   ALT 10 (L) 14 - 54 U/L   Alkaline Phosphatase 40 38 - 126 U/L   Total Bilirubin 1.0 0.3 - 1.2 mg/dL   GFR calc non Af Amer >60 >60 mL/min   GFR calc Af Amer >60 >  60 mL/min   Anion gap 6 5 - 15  CBC     Status: None   Collection Time: 05/18/17 10:58 AM  Result Value Ref Range   WBC 5.8 3.6 - 11.0 K/uL   RBC 4.43 3.80 - 5.20 MIL/uL   Hemoglobin 12.4 12.0 - 16.0 g/dL   HCT 29.537.2 62.135.0 - 30.847.0 %   MCV 84.0 80.0 - 100.0 fL   MCH 28.0 26.0 - 34.0 pg   MCHC 33.4 32.0 - 36.0 g/dL   RDW 65.713.6 84.611.5 - 96.214.5 %   Platelets 236 150 - 440 K/uL  Urinalysis, Complete w Microscopic     Status: Abnormal   Collection Time: 05/18/17 10:58 AM  Result Value Ref Range   Color, Urine YELLOW (A) YELLOW   APPearance CLEAR (A) CLEAR   Specific Gravity, Urine 1.025 1.005 - 1.030   pH 6.0 5.0 - 8.0   Glucose, UA NEGATIVE NEGATIVE mg/dL   Hgb urine dipstick NEGATIVE NEGATIVE   Bilirubin Urine NEGATIVE NEGATIVE   Ketones, ur NEGATIVE NEGATIVE mg/dL   Protein, ur NEGATIVE NEGATIVE mg/dL   Nitrite NEGATIVE NEGATIVE   Leukocytes, UA NEGATIVE NEGATIVE   RBC / HPF 0-5 0 - 5 RBC/hpf   WBC, UA NONE SEEN 0 - 5 WBC/hpf   Bacteria, UA NONE SEEN NONE SEEN   Squamous Epithelial / LPF 0-5 (A) NONE SEEN   Mucus PRESENT   Pregnancy, urine POC     Status: Abnormal   Collection Time: 05/18/17 11:01 AM  Result Value Ref Range   Preg Test, Ur POSITIVE (A) NEGATIVE   ____________________________________________ ____________________________________________   PROCEDURES  Procedure(s)  performed:  Procedures    Critical Care performed: no ____________________________________________   INITIAL IMPRESSION / ASSESSMENT AND PLAN / ED COURSE  Pertinent labs & imaging results that were available during my care of the patient were reviewed by me and considered in my medical decision making (see chart for details).  DDX: hypermesis, cholelithiasis, gastritis, enteritis, sbo, pregnancy  Chryl HeckSasha K Grace IsaacWatts is a 31 y.o. who presents to the ED with nausea early pregnancy. She has no pain. No signs or symptoms to suggest ectopic. Has had similar symptoms in the past previous pregnancy. At this point blood work is reassuring she has no evidence of infectious process and is otherwise well-perfused therefore we'll start the patient on Ticlid juice and have her follow-up with OB/GYN.  Have discussed with the patient and available family all diagnostics and treatments performed thus far and all questions were answered to the best of my ability. The patient demonstrates understanding and agreement with plan.       ____________________________________________   FINAL CLINICAL IMPRESSION(S) / ED DIAGNOSES  Final diagnoses:  Less than [redacted] weeks gestation of pregnancy  Nausea      NEW MEDICATIONS STARTED DURING THIS VISIT:  New Prescriptions   DOXYLAMINE-PYRIDOXINE 10-10 MG TBEC    Take 1 tablet by mouth 2 (two) times daily.     Note:  This document was prepared using Dragon voice recognition software and may include unintentional dictation errors.     Willy Eddyobinson, Shay Jhaveri, MD 05/18/17 1416

## 2017-05-18 NOTE — ED Triage Notes (Signed)
Pt to ed with c/o nausea x 2 days.  Denies vomiting, denies abd pain, denies diarrhea.

## 2017-05-20 ENCOUNTER — Encounter: Payer: Self-pay | Admitting: *Deleted

## 2017-05-20 ENCOUNTER — Emergency Department: Payer: Medicaid Other

## 2017-05-20 ENCOUNTER — Emergency Department
Admission: EM | Admit: 2017-05-20 | Discharge: 2017-05-20 | Disposition: A | Discharge status: Home / Self Care | Payer: Medicaid Other | Attending: Emergency Medicine | Admitting: Emergency Medicine

## 2017-05-20 DIAGNOSIS — R1030 Lower abdominal pain, unspecified: Secondary | ICD-10-CM | POA: Diagnosis present

## 2017-05-20 DIAGNOSIS — O200 Threatened abortion: Secondary | ICD-10-CM | POA: Insufficient documentation

## 2017-05-20 DIAGNOSIS — R110 Nausea: Secondary | ICD-10-CM | POA: Insufficient documentation

## 2017-05-20 DIAGNOSIS — R102 Pelvic and perineal pain: Secondary | ICD-10-CM

## 2017-05-20 DIAGNOSIS — O26899 Other specified pregnancy related conditions, unspecified trimester: Secondary | ICD-10-CM

## 2017-05-20 LAB — HCG, QUANTITATIVE, PREGNANCY: HCG, BETA CHAIN, QUANT, S: 1355 m[IU]/mL — AB (ref <5)

## 2017-05-20 NOTE — ED Notes (Signed)
Pt verbalized understanding of discharge instructions. NAD at this time. 

## 2017-05-20 NOTE — ED Notes (Signed)
Patient transported to Ultrasound 

## 2017-05-20 NOTE — ED Provider Notes (Signed)
[facility] Emergency Department Provider Note       Time seen: ----------------------------------------- 9:16 AM on 05/20/2017 -----------------------------------------     I have reviewed the triage vital signs and the nursing notes.   HISTORY   Chief Complaint Abdominal Pain    HPI Lisa Rowland is a 31 y.o. female who presents to the ED for pregnancy with lower abdominal cramping and discomfort. She is G6 P3 Ab2 and states she was seen here recently for hyperemesis in pregnancy. Currently her nausea is mild. She denies any vaginal bleeding or leakage of fluid. She denies any vaginal discharge. She presents for evaluation of her pelvic pain.   Past Medical History:  Diagnosis Date  . Anemia   . Anxiety   . ASCUS favor benign   . Bipolar 1 disorder (HCC)   . HPV in female   . Hypotension   . Scoliosis     Patient Active Problem List   Diagnosis Date Noted  . Headache, migraine 01/23/2014  . 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 Clinical trial participant 11/25/2013  . Opioid dependence (HCC) 10/27/2013  . Myofascial pain 10/10/2013  . Continuous opioid dependence (HCC) 09/22/2013  . Bipolar affective disorder (HCC) 08/19/2013  . Scoliosis 08/19/2013  . Lesion of vulva 08/02/2013  . HPV test positive 06/08/2013  . Cervical pain 04/03/2012    Past Surgical History:  Procedure Laterality Date  . INDUCED ABORTION      Allergies Amoxicillin; Bee venom; Mushroom extract complex; and Coconut oil  Social History Social History  Substance Use Topics  . Smoking status: Never Smoker  . Smokeless tobacco: Never Used  . Alcohol use No    Review of Systems Constitutional: Negative for fever. Cardiovascular: Negative for chest pain. Respiratory: Negative for shortness of breath. Gastrointestinal: Negative for abdominal pain, Positive for nausea Genitourinary: Positive for pelvic pain Musculoskeletal: Negative for back  pain. Skin: Negative for rash. Neurological: Negative for headaches, focal weakness or numbness.  All systems negative/normal/unremarkable except as stated in the HPI  ____________________________________________   PHYSICAL EXAM:  VITAL SIGNS: ED Triage Vitals [05/20/17 0910]  Enc Vitals Group     BP 117/77     Pulse Rate 72     Resp 16     Temp 98.3 F (36.8 C)     Temp Source Oral     SpO2 100 %     Weight 195 lb (88.5 kg)     Height 5\' 7"  (1.702 m)     Head Circumference      Peak Flow      Pain Score 6     Pain Loc      Pain Edu?      Excl. in GC?    Constitutional: Alert and oriented. Well appearing and in no distress. Eyes: Conjunctivae are normal. Normal extraocular movements. Cardiovascular: Normal rate, regular rhythm. No murmurs, rubs, or gallops. Respiratory: Normal respiratory effort without tachypnea nor retractions. Breath sounds are clear and equal bilaterally. No wheezes/rales/rhonchi. Gastrointestinal: Soft and nontender. Normal bowel sounds Musculoskeletal: Nontender with normal range of motion in extremities. No lower extremity tenderness nor edema. Neurologic:  Normal speech and language. No gross focal neurologic deficits are appreciated.  Skin:  Skin is warm, dry and intact. No rash noted. Psychiatric: Mood and affect are normal. Speech and behavior are normal.  ____________________________________________  ED COURSE:  Pertinent labs & imaging results that were available during my care of the patient were reviewed by me and considered  in my medical decision making (see chart for details). Patient presents for Abdominal pain and pregnancy, we will assess with labs and imaging as indicated. Clinical Course as of May 20 1120  Wed May 20, 2017  0928 patient has A positive blood type  [JW]    Clinical Course User Index [JW] Emily FilbertWilliams, Shawna Wearing E, MD   Procedures ____________________________________________   LABS (pertinent  positives/negatives)  Labs Reviewed  HCG, QUANTITATIVE, PREGNANCY - Abnormal; Notable for the following:       Result Value   hCG, Beta Chain, Quant, S 1,355 (*)    All other components within normal limits    RADIOLOGY  Pregnancy ultrasound IMPRESSION: Complex hypoechoic structure measuring up to 1 cm in diameter within the endometrial cavity without evidence of a yolk sac, fetal pole, or cardiac activity. This may reflect an anembryonic pregnancy or incomplete miscarriage or atypical appearing but very early IUP. There are no findings in the adnexal regions to suggest an ectopic pregnancy.  Continued follow-up beta HCG determinations would be useful. If these beta HCG values continue to rise, rescanning is available upon request. ____________________________________________  FINAL ASSESSMENT AND PLAN  Threatened miscarriage  Plan: Patient's labs and imaging were dictated above. Patient had presented for abdominal pain and pregnancy after having been seen here 2 days ago for hyperemesis. Ultrasound findings are concerning for miscarriage with no signs of ectopic pregnancy. Blood type is A+, and currently she is not bleeding. We will advise close outpatient follow-up with GYN.   Emily FilbertWilliams, Burnard Enis E, MD   Note: This note was generated in part or whole with voice recognition software. Voice recognition is usually quite accurate but there are transcription errors that can and very often do occur. I apologize for any typographical errors that were not detected and corrected.     Emily FilbertWilliams, Morse Brueggemann E, MD 05/20/17 [phone]

## 2017-05-20 NOTE — ED Triage Notes (Signed)
Pt states she found out she was pregnant on Monday, states lower abd cramping and discomfort, denies any vaginal bleeding or discharge, awake and alert in no acute distress

## 2017-05-20 NOTE — ED Notes (Signed)
Pt states she was recently told she was pregnant, pt c/o pelvic pain for the past couple of days and is concerned due to Hx of open cervix.. Denies bleeding or leaking fluid..Marland Kitchen

## 2017-05-21 ENCOUNTER — Encounter: Payer: Self-pay | Admitting: Obstetrics and Gynecology

## 2017-05-21 ENCOUNTER — Telehealth: Payer: Self-pay

## 2017-05-21 ENCOUNTER — Ambulatory Visit (INDEPENDENT_AMBULATORY_CARE_PROVIDER_SITE_OTHER): Payer: Medicaid Other | Admitting: Obstetrics and Gynecology

## 2017-05-21 VITALS — BP 94/60 | HR 76 | Wt 194.2 lb

## 2017-05-21 DIAGNOSIS — O26899 Other specified pregnancy related conditions, unspecified trimester: Secondary | ICD-10-CM

## 2017-05-21 DIAGNOSIS — O2691 Pregnancy related conditions, unspecified, first trimester: Secondary | ICD-10-CM

## 2017-05-21 DIAGNOSIS — R109 Unspecified abdominal pain: Secondary | ICD-10-CM

## 2017-05-21 MED ORDER — IBUPROFEN 800 MG PO TABS: 800.0000 mg | ORAL_TABLET | Freq: Three times a day (TID) | ORAL | 1 refills | Status: DC | PRN

## 2017-05-21 MED ORDER — MISOPROSTOL 200 MCG PO TABS: ORAL_TABLET | ORAL | 1 refills | Status: DC

## 2017-05-21 MED ORDER — HYDROCODONE-ACETAMINOPHEN 5-325 MG PO TABS: 1.0000 | ORAL_TABLET | Freq: Four times a day (QID) | ORAL | 0 refills | Status: DC | PRN

## 2017-05-21 NOTE — Patient Instructions (Signed)
Blighted Ovum  A blighted ovum is a common kind of early pregnancy failure. It happens when a fertilized egg attaches to the uterus but stops growing. Even though the egg never develops, the body acts like it is pregnant. A sac starts to form around the egg, and tissue to support a baby starts to form in the placenta.  What are the causes?  This condition is usually caused by a genetic defect in the egg.  What are the signs or symptoms?  Early symptoms of this condition are the same as those of early pregnancy. They include:   A missed menstrual period.   Fatigue.   Feeling sick to your stomach (nauseous).   Sore breasts.    Later symptoms are those of pregnancy loss. They include:   Abdominal cramps.   Vaginal bleeding or spotting.   A menstrual period that is heavier than usual.    How is this diagnosed?  This condition is usually diagnosed during a routine ultrasound. It can be confirmed with blood tests.  How is this treated?  This condition may be treated by:   Waiting until your body naturally gets rid of the empty egg sac and placenta (miscarriage).   Taking medicine to start a miscarriage. This medicine can be taken by mouth or placed into the vagina.   Having a surgical procedure to remove the tissue. Your health care provider would open the entrance to your womb (dilation) and remove the tissue (curettage).    Follow these instructions at home:   Take over-the-counter and prescription medicines only as told by your health care provider.   Talk to your health care provider about when you can try to get pregnant again. Having this condition does not mean you will lose future pregnancies.   After your miscarriage:  ? Rest at home for a few days.  ? You may bleed heavily for a week or more, and you may have light bleeding for a couple weeks after that. Wear a pad until vaginal bleeding stops.  Contact a health care provider if:   You have a fever or chills.   Your pain medicine is not  helping.   You have vaginal bleeding that continues for longer than expected.  Get help right away if:   You have severe abdominal pain.   You feel dizzy or faint.   You pass out.   You have very heavy vaginal bleeding. A sign that vaginal bleeding is very heavy is if blood soaks through two large sanitary pads an hour for more than two hours.  This information is not intended to replace advice given to you by your health care provider. Make sure you discuss any questions you have with your health care provider.  Document Released: 12/17/2010 Document Revised: 02/07/2016 Document Reviewed: 01/17/2015  Elsevier Interactive Patient Education  2018 [employer]vier Inc[employer]

## 2017-05-21 NOTE — Telephone Encounter (Signed)
Received call from pharmacist [facility] [address]. Pharmacists wants to make us aware that pt is receiving monthly rx for Percocet #90 TID from Dr. Billee CashingWayland Mckenzie, inquires if we cant to proceed with Norco rx that Dr.Cherry wrote today. After speaking with Dr.cherry informed pharmacist that rx would not be filled as pt did not make us aware that she was receiving other narcotics from another physician. Also of note pt typically receives rx for pain meds from [facility] in [place] [facility]. Last Percocet script filled on 05/12/17.

## 2017-05-21 NOTE — Progress Notes (Signed)
GYNECOLOGY CLINIC PROGRESS NOTE  Subjective:    Lisa Rowland is a 31 y.o. [phone] female. Lisa Rowland reports pelvic cramping in pregnancy since 2 days ago.  She was seen in the Emergency Room 2 days ago where she was worked up and ultrasound noted possible abnormal pregnancy (possible gestational sac but no fetal pole or FCA) at ~ 5.[redacted] weeks gestation.  She denies vaginal bleeding. She is not in acute distress. Ectopic risks: none.   Had been on Depo x 2 years, last injection was November, never had a cycle afterwards.  Found out she was pregnant due to vomiting and nausea and went to the ER several days prior to most recent presentation.  Had pregnancy test on 8/20 and was negative.  Pain is achy, dull, and radiates to the back.  Has not taken anything for pain. Pregnancy is desired.     OB History  Gravida Para Term Preterm AB Living  0 2 3  SAB TAB Ectopic Multiple Live Births  0 2 0 0 3    # Outcome Date GA Lbr Len/2nd Weight Sex Delivery Anes PTL Lv  6 Current           5 Term         LIV  4 Term         LIV  3 Term         LIV  2 TAB         FD  1 TAB         FD    Obstetric Comments  H/o cervical incompetency in last pregnancy, (however never required cerclage), had to take 17-P during pregnancy    Past Medical History:  Diagnosis Date  . Anemia   . Anxiety   . ASCUS favor benign   . Bipolar 1 disorder (HCC)   . HPV in female   . Hypotension   . Scoliosis     Family History  Problem Relation Age of Onset  . Osteoarthritis Mother   . Asthma Mother   . Hypertension Mother   . Osteoarthritis Father   . Hypertension Father   . Osteoarthritis Maternal Grandmother   . Asthma Maternal Grandmother   . Cancer Maternal Grandmother        breast cancer  . Diabetes Maternal Grandmother   . Heart failure Maternal Grandmother   . Hyperlipidemia Maternal Grandmother   . Hypertension Maternal Grandmother   . Migraines Maternal Grandmother   . Thyroid disease Maternal  Grandmother   . Osteoarthritis Paternal Grandmother   . Asthma Paternal Grandmother   . Cancer Paternal Grandmother        stomach  . Diabetes Paternal Grandmother   . Heart failure Paternal Grandmother   . Hyperlipidemia Paternal Grandmother   . Hypertension Paternal Grandmother   . Breast cancer Paternal Aunt   . Hypertension Sister     Past Surgical History:  Procedure Laterality Date  . INDUCED ABORTION      Social History   Social History  . Marital status: Single    Spouse name: N/A  . Number of children: N/A  . Years of education: N/A   Occupational History  . Not on file.   Social History Main Topics  . Smoking status: Never Smoker  . Smokeless tobacco: Never Used  . Alcohol use No  . Drug use: No  . Sexual activity: Yes    Birth control/ protection: Other-see comments   Other Topics  Concern  . Not on file   Social History Narrative  . No narrative on file   Current Outpatient Prescriptions on File Prior to Visit  Medication Sig Dispense Refill  . albuterol (PROVENTIL HFA;VENTOLIN HFA) 108 (90 Base) MCG/ACT inhaler Inhale 2 puffs into the lungs every 6 (six) hours as needed for wheezing or shortness of breath.     . ALPRAZolam (XANAX) 0.5 MG tablet Take 0.5 mg by mouth 2 (two) times daily as needed for anxiety.    . Doxylamine-Pyridoxine 10-10 MG TBEC Take 1 tablet by mouth 2 (two) times daily. (Patient not taking: Reported on 05/21/2017) 30 tablet 1   No current facility-administered medications on file prior to visit.     Allergies  Allergen Reactions  . Amoxicillin Hives  . Bee Venom Swelling  . Mushroom Extract Complex Swelling  . Coconut Oil     Review of Systems Pertinent items are noted in HPI. Remainder of comprehensive review of systems negative.   Objective:     BP 94/60 (BP Location: Left Arm, Patient Position: Sitting, Cuff Size: Normal)   Pulse 76   Wt 194 lb 3.2 oz (88.1 kg)   LMP 03/17/2017   BMI 30.42 kg/m   General:   alert  and no distress  Heart: regular rate and rhythm, S1, S2 normal, no murmur, click, rub or gallop  Lungs: clear to auscultation bilaterally  Abdomen: soft, non-tender, without masses or organomegaly  Pelvic: Exam deferred.     Labs:   Results for Lisa Rowland, Lisa Rowland (MRN [id number]) as of 05/23/2017 18:39  Ref. Range 05/18/2017 10:58 05/20/2017 09:28  HCG, Beta Chain, Quant, S Latest Ref Range: <5 mIU/mL 1,156 (H) 1,355 (H)    Imaging CLINICAL DATA:  Pelvic pain for the past day. No mention of vaginal bleeding bleeding. Quantitative beta HCG is 1355. On September 3rd it was 1156.  EXAM: OBSTETRIC <14 WK [place] AND TRANSVAGINAL OB  US DOPPLER ULTRASOUND OF OVARIES  TECHNIQUE: Both transabdominal and transvaginal ultrasound examinations were performed for complete evaluation of the gestation as well as the maternal uterus, adnexal regions, and pelvic cul-de-sac. Transvaginal technique was performed to assess early pregnancy.  Color and duplex Doppler ultrasound was utilized to evaluate blood flow to the ovaries.  COMPARISON:  None.  FINDINGS: Intrauterine gestational sac: No typical gestational sac is visualized. There is a complex hypoechoic region in the endometrial cavity measuring 8 x 5 by 10 mm which would correspond to a gestational age of [redacted] weeks 2 days to 5 week 5 days.  Yolk sac:  None  Embryo:  None  Cardiac Activity: None  Heart Rate: n/a bpm  Maternal uterus/adnexae: No definite adnexal masses are observed.  There is a small amount of free pelvic fluid.  Pulsed Doppler evaluation of both ovaries demonstrates normal appearing low-resistance arterial and venous waveforms.  IMPRESSION: Complex hypoechoic structure measuring up to 1 cm in diameter within the endometrial cavity without evidence of a yolk sac, fetal pole, or cardiac activity. This may reflect an anembryonic pregnancy or incomplete miscarriage or atypical appearing but very early  IUP. There are no findings in the adnexal regions to suggest an ectopic pregnancy.  Continued follow-up beta HCG determinations would be useful. If these beta HCG values continue to rise, rescanning is available upon request.   Electronically Signed   By: David  [place] M.D.   On: 05/20/2017 11:16  Assessment:   Abnormal pregnancy with suspected blighted ovum at ~ [redacted] weeks gestation  Cramping  Plan:   - Quantitative hCG tomorrow.  If levels continue to remain the same or declining, confirms abnormal pregnancy.  If increasing, will continue to f/u with serial BHCGs and ultrasound in 1 week. Discussed options of management if miscarriage confirmed, including expectant management, medical management with Cytotec, and surgical management with D&C.  Patient notes she would prefer medical management.  Will order further labs if miscarriage confirmed.  Will need to f/u 1 week after bleeding initiated to repeat BHCG and ensure passage of POCs.  - Warning signs discussed: to call for increased bleeding, abdominal or shoulder pain, light headedness, or if she has any concerns.   - Given prescription for Cytotec, Vicodin, and advised on Ibuprofen for milder pain.  TO

## 2017-05-22 ENCOUNTER — Other Ambulatory Visit: Payer: Medicaid Other

## 2017-05-22 ENCOUNTER — Telehealth: Payer: Self-pay | Admitting: Obstetrics and Gynecology

## 2017-05-22 DIAGNOSIS — O2691 Pregnancy related conditions, unspecified, first trimester: Secondary | ICD-10-CM

## 2017-05-22 NOTE — Telephone Encounter (Signed)
Patient was told that her results would be in by noon today by Dr Valentino Saxonherry and Chip BoerVicki -   Please call asap with results

## 2017-05-23 ENCOUNTER — Encounter: Payer: Self-pay | Admitting: Obstetrics and Gynecology

## 2017-05-23 LAB — BETA HCG QUANT (REF LAB): HCG QUANT: 1129 m[IU]/mL

## 2017-05-23 NOTE — Telephone Encounter (Signed)
Contacted patient regarding BHCG labs.  Informed her of decreasing levels, which most likely means that pregnancy is abnormal (likely blighted ovum).  Previously discussed options for management with patient in clinic.  Patient can proceed with Cytotec as previously instructed. Reiterated bleeding precautions.

## 2017-05-24 ENCOUNTER — Encounter: Payer: Self-pay | Admitting: *Deleted

## 2017-05-24 DIAGNOSIS — O0990 Supervision of high risk pregnancy, unspecified, unspecified trimester: Secondary | ICD-10-CM | POA: Insufficient documentation

## 2017-05-26 ENCOUNTER — Encounter: Payer: Self-pay | Admitting: Obstetrics and Gynecology

## 2017-05-26 ENCOUNTER — Encounter: Payer: Medicaid Other | Admitting: Obstetrics and Gynecology

## 2017-05-26 NOTE — Progress Notes (Signed)
Patient did not keep new OB appointment for 05/26/2017.  Lisa Rowland, Jr MD Attending [facility](Faculty Practice)

## 2017-06-02 ENCOUNTER — Other Ambulatory Visit: Payer: Medicaid Other

## 2017-06-05 ENCOUNTER — Emergency Department
Admission: EM | Admit: 2017-06-05 | Discharge: 2017-06-05 | Disposition: A | Discharge status: Home / Self Care | Payer: Medicaid Other | Attending: Emergency Medicine | Admitting: Emergency Medicine

## 2017-06-05 ENCOUNTER — Emergency Department: Payer: Medicaid Other

## 2017-06-05 ENCOUNTER — Encounter: Payer: Self-pay | Admitting: Emergency Medicine

## 2017-06-05 DIAGNOSIS — Z79899 Other long term (current) drug therapy: Secondary | ICD-10-CM | POA: Diagnosis not present

## 2017-06-05 DIAGNOSIS — E349 Endocrine disorder, unspecified: Secondary | ICD-10-CM

## 2017-06-05 DIAGNOSIS — O0281 Inappropriate change in quantitative human chorionic gonadotropin (hCG) in early pregnancy: Secondary | ICD-10-CM | POA: Insufficient documentation

## 2017-06-05 DIAGNOSIS — O039 Complete or unspecified spontaneous abortion without complication: Secondary | ICD-10-CM | POA: Insufficient documentation

## 2017-06-05 DIAGNOSIS — R102 Pelvic and perineal pain: Secondary | ICD-10-CM | POA: Diagnosis present

## 2017-06-05 LAB — POCT PREGNANCY, URINE: PREG TEST UR: NEGATIVE

## 2017-06-05 LAB — HCG, QUANTITATIVE, PREGNANCY: HCG, BETA CHAIN, QUANT, S: 51 m[IU]/mL — AB (ref <5)

## 2017-06-05 LAB — URINALYSIS, COMPLETE (UACMP) WITH MICROSCOPIC
Bacteria, UA: NONE SEEN
Bilirubin Urine: NEGATIVE
GLUCOSE, UA: NEGATIVE mg/dL
HGB URINE DIPSTICK: NEGATIVE
KETONES UR: NEGATIVE mg/dL
LEUKOCYTES UA: NEGATIVE
NITRITE: NEGATIVE
PROTEIN: NEGATIVE mg/dL
RBC / HPF: NONE SEEN RBC/hpf (ref 0–5)
SPECIFIC GRAVITY, URINE: 1.017 (ref 1.005–1.030)
pH: 8 (ref 5.0–8.0)

## 2017-06-05 NOTE — ED Notes (Signed)
Pt unsure if she is pregnant or not. No bleeding or abd pain. Pt alert and oriented X4, active, cooperative, pt in NAD. RR even and unlabored, color WNL.

## 2017-06-05 NOTE — ED Notes (Signed)
Spoke with Dr. Scotty Court in regards to patients presentation. Verbal order to check urine pregnancy and send to [facility].

## 2017-06-05 NOTE — ED Notes (Signed)
POC preg negative.

## 2017-06-05 NOTE — ED Notes (Signed)
Patient transported to Ultrasound 

## 2017-06-05 NOTE — Discharge Instructions (Addendum)
Your pregnancy hormone is not yet "0" (zero). This may give you a positive home test. Unfortunately, your ultrasound confirms a miscarriage as there is no evidence of a pregnancy or retained products of pregnancy. You should follow-up with Dr. Valentino Saxon next week for a repeat blood draw. This will confirm that your hormone level is negative. Take Ibuprofen as needed for pain relief. Return to the ED as needed.

## 2017-06-05 NOTE — ED Triage Notes (Signed)
Patient presents to ED via POV from home. Patient was seen on 5th of this month and told she was having a threatened miscarriage. Patient unable to state when she last period was because she was on the depo shot. Patient states she followed up at [facility] and they also states she was having a miscarriage. Patient states she took a pregnancy test today and it was positive. Patient states, "I just want to know if I am pregnant or not". Patient denies vaginal bleeding or pain at this time.

## 2017-06-05 NOTE — ED Provider Notes (Signed)
[facility] Emergency Department Provider Note ____________________________________________  Time seen: 1248  I have reviewed the triage vital signs and the nursing notes.  HISTORY  Chief Complaint  Possible Pregnancy  HPI Lisa Rowland is a 31 y.o. female, G6 P3 Ab2, presents to the ED for evaluation of intermittent pelvic pain and a positive home pregnancy test today. The patient was seen in the ED on 9/3, after having hyperemesis secondary to early confirmed pregnancy. She apparently been on Depo-Provera, and not had a normal menstrual period and some 2 years. She was seen in the EDagain on 9/5 for threatened Ab with a BHCG of 1355; but the ultrasound was consistent with a probable anembryonic pregnancy or incomplete miscarriage. She was referred to [facility] for serial labs. Her repeat lab on 9/7 was down to 1129. She was given consulted on expected management, medical management with Cytotec, and surgical management with D&C. She reports a period on 9/8-9/13. She did not use the Cytotec, nor did she return for repeat BHCG. She presents today, after she decided to take a home pregancy test, which was positive. She denies any current vaginal bleeding, discharge, or severe abdominal pain. She denies fever, chills, nausea, or chest pain.   Past Medical History:  Diagnosis Date  . Anemia   . Anxiety   . ASCUS favor benign   . Bipolar 1 disorder (HCC)   . HPV in female   . Hypotension   . Scoliosis     Patient Active Problem List   Diagnosis Date Noted  . Supervision of high-risk pregnancy 05/24/2017  . Headache, migraine 01/23/2014  . 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 Clinical trial participant 11/25/2013  . Opioid dependence (HCC) 10/27/2013  . Myofascial pain 10/10/2013  . Continuous opioid dependence (HCC) 09/22/2013  . Bipolar affective disorder (HCC) 08/19/2013  . Scoliosis 08/19/2013  . Lesion of vulva 08/02/2013  . HPV test positive  06/08/2013  . Cervical pain 04/03/2012    Past Surgical History:  Procedure Laterality Date  . INDUCED ABORTION      Prior to Admission medications   Medication Sig Start Date End Date Taking? Authorizing Provider  albuterol (PROVENTIL HFA;VENTOLIN HFA) 108 (90 Base) MCG/ACT inhaler Inhale 2 puffs into the lungs every 6 (six) hours as needed for wheezing or shortness of breath.     [provider]  ALPRAZolam Prudy Feeler) 0.5 MG tablet Take 0.5 mg by mouth 2 (two) times daily as needed for anxiety.    [provider]  Doxylamine-Pyridoxine 10-10 MG TBEC Take 1 tablet by mouth 2 (two) times daily. Patient not taking: Reported on 05/21/2017 05/18/17   Willy Eddy, MD  ibuprofen (ADVIL,MOTRIN) 800 MG tablet Take 1 tablet (800 mg total) by mouth every 8 (eight) hours as needed. 05/21/17   Hildred Laser, MD  misoprostol (CYTOTEC) 200 MCG tablet Place four tablets in the vagina at night. Can repeat in 24 hrs if no bleeding. 05/21/17   Hildred Laser, MD    Allergies Amoxicillin; Bee venom; Mushroom extract complex; and Coconut oil  Family History  Problem Relation Age of Onset  . Osteoarthritis Mother   . Asthma Mother   . Hypertension Mother   . Osteoarthritis Father   . Hypertension Father   . Osteoarthritis Maternal Grandmother   . Asthma Maternal Grandmother   . Cancer Maternal Grandmother        breast cancer  . Diabetes Maternal Grandmother   . Heart failure Maternal Grandmother   .  Hyperlipidemia Maternal Grandmother   . Hypertension Maternal Grandmother   . Migraines Maternal Grandmother   . Thyroid disease Maternal Grandmother   . Osteoarthritis Paternal Grandmother   . Asthma Paternal Grandmother   . Cancer Paternal Grandmother        stomach  . Diabetes Paternal Grandmother   . Heart failure Paternal Grandmother   . Hyperlipidemia Paternal Grandmother   . Hypertension Paternal Grandmother   . Breast cancer Paternal Aunt   . Hypertension Sister      Social History Social History  Substance Use Topics  . Smoking status: Never Smoker  . Smokeless tobacco: Never Used  . Alcohol use No    Review of Systems  Constitutional: Negative for fever. Cardiovascular: Negative for chest pain. Respiratory: Negative for shortness of breath. Gastrointestinal: Negative for abdominal pain, vomiting and diarrhea. Genitourinary: Negative for dysuria. Reports pelvic pain Musculoskeletal: Negative for back pain. ____________________________________________  PHYSICAL EXAM:  VITAL SIGNS: ED Triage Vitals [06/05/17 1214]  Enc Vitals Group     BP 123/74     Pulse Rate 80     Resp 17     Temp 98 F (36.7 C)     Temp Source Oral     SpO2 100 %     Weight 197 lb (89.4 kg)     Height  (1.702 m)     Head Circumference      Peak Flow      Pain Score      Pain Loc      Pain Edu?      Excl. in GC?     Constitutional: Alert and oriented. Well appearing and in no distress. Head: Normocephalic and atraumatic. Cardiovascular: Normal rate, regular rhythm. Normal distal pulses. Respiratory: Normal respiratory effort. No wheezes/rales/rhonchi. Gastrointestinal: Soft and nontender. No distention. GU: deferred Musculoskeletal: Nontender with normal range of motion in all extremities.  Neurologic:  Normal gait without ataxia. Normal speech and language. No gross focal neurologic deficits are appreciated. Skin:  Skin is warm, dry and intact. No rash noted. ____________________________________________   LABS (pertinent positives/negatives)  Labs Reviewed  HCG, QUANTITATIVE, PREGNANCY - Abnormal; Notable for the following:       Result Value   hCG, Beta Chain, Quant, S 51 (*)    All other components within normal limits  URINALYSIS, COMPLETE (UACMP) WITH MICROSCOPIC - Abnormal; Notable for the following:    Color, Urine YELLOW (*)    APPearance CLOUDY (*)    Squamous Epithelial / LPF 0-5 (*)    All other components within normal limits   POC URINE PREG, ED  POCT PREGNANCY, URINE  ____________________________________________   RADIOLOGY  OB Transvaginal US  IMPRESSION: 1. 1.4 cm probable right ovarian corpus luteum containing a corpus luteum cyst. An early ectopic pregnancy adjacent to the right ovary is less likely. Correlation with a quantitative serum beta HCG is recommended. 2. No intrauterine gestation or retained products of conception seen. 3. Trace amount of free peritoneal fluid, within normal limits of physiological fluid. ____________________________________________  PROCEDURES  ----------------------------------------- 4:19 PM on 06/05/2017 -----------------------------------------  S/W Dr. Valentino Saxon. She will see the patient in the office in 1 week for repeat BHCG. Patient may take Ibuprofen for pain, as needed. ____________________________________________  INITIAL IMPRESSION / ASSESSMENT AND PLAN / ED COURSE  Patient with the ED evaluation of continued pelvic abdominal pain following serial labs for what appeared to be a blighted ovum. Patient's last quantitative beta hCG was on 9/7, and was reportedly falling  off from a previous values. She was given a prescription for Cytotec which she did not use. She does report a menstrual period lasting from 9/8 through 9/13. She continued to express pelvic pain, and took a home pregnancy test today, which was positive. She was found today to have a beta-hCG of 51. Ultrasound confirms no IUP or retained POC. She will follow-up with Dr. Valentino Saxon for repeat BHCG in 1 week.  ____________________________________________  FINAL CLINICAL IMPRESSION(S) / ED DIAGNOSES  Final diagnoses:  Complete abortion without complication  Elevated serum hCG in female, not pregnant      Karmen Stabs, Charlesetta Ivory, PA-C 06/05/17 1649    Sharman Cheek, MD 06/07/17 1616

## 2017-06-05 NOTE — ED Notes (Signed)
Pt alert and oriented X4, active, cooperative, pt in NAD. RR even and unlabored, color WNL.  Discharge and followup instructions reviewed. Pt informed to return if any life threatening symptoms occur.   

## 2017-10-10 ENCOUNTER — Encounter: Payer: Self-pay | Admitting: Intensive Care

## 2017-10-10 ENCOUNTER — Emergency Department
Admission: EM | Admit: 2017-10-10 | Discharge: 2017-10-10 | Disposition: A | Discharge status: Home / Self Care | Payer: Medicaid Other | Attending: Student in an Organized Health Care Education/Training Program | Admitting: Student in an Organized Health Care Education/Training Program

## 2017-10-10 DIAGNOSIS — R05 Cough: Secondary | ICD-10-CM | POA: Diagnosis present

## 2017-10-10 DIAGNOSIS — J069 Acute upper respiratory infection, unspecified: Secondary | ICD-10-CM | POA: Insufficient documentation

## 2017-10-10 DIAGNOSIS — Z79899 Other long term (current) drug therapy: Secondary | ICD-10-CM | POA: Diagnosis not present

## 2017-10-10 MED ORDER — AZITHROMYCIN 250 MG PO TABS: ORAL_TABLET | ORAL | 0 refills | Status: DC

## 2017-10-10 MED ORDER — ALBUTEROL SULFATE HFA 108 (90 BASE) MCG/ACT IN AERS: 2.0000 | INHALATION_SPRAY | Freq: Four times a day (QID) | RESPIRATORY_TRACT | 2 refills | Status: DC | PRN

## 2017-10-10 NOTE — ED Triage Notes (Signed)
Patient reports she has had cough X4-5 days

## 2017-10-10 NOTE — ED Provider Notes (Signed)
[facility] Emergency Department Provider Note  ____________________________________________   First MD Initiated Contact with Patient 10/10/17 1016     (approximate)  I have reviewed the triage vital signs and the nursing notes.   HISTORY  Chief Complaint Cough    HPI CRISTOL ENGDAHL is a 32 y.o. female who complains of cough and congestion for 4-5 days.  She states she has not had a fever.  States her mucus has been green.  She denies vomiting or diarrhea  Past Medical History:  Diagnosis Date  . Anemia   . Anxiety   . ASCUS favor benign   . Bipolar 1 disorder (HCC)   . HPV in female   . Hypotension   . Scoliosis     Patient Active Problem List   Diagnosis Date Noted  . Supervision of high-risk pregnancy 05/24/2017  . Headache, migraine 01/23/2014  . 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 Clinical trial participant 11/25/2013  . Opioid dependence (HCC) 10/27/2013  . Myofascial pain 10/10/2013  . Continuous opioid dependence (HCC) 09/22/2013  . Bipolar affective disorder (HCC) 08/19/2013  . Scoliosis 08/19/2013  . Lesion of vulva 08/02/2013  . HPV test positive 06/08/2013  . Cervical pain 04/03/2012    Past Surgical History:  Procedure Laterality Date  . INDUCED ABORTION      Prior to Admission medications   Medication Sig Start Date End Date Taking? Authorizing Provider  albuterol (PROVENTIL HFA;VENTOLIN HFA) 108 (90 Base) MCG/ACT inhaler Inhale 2 puffs into the lungs every 6 (six) hours as needed for wheezing or shortness of breath. 10/10/17   Donique Hammonds, Roselyn Bering, PA-C  ALPRAZolam Prudy Feeler) 0.5 MG tablet Take 0.5 mg by mouth 2 (two) times daily as needed for anxiety.    [provider]  azithromycin (ZITHROMAX Z-PAK) 250 MG tablet 2 pills today then 1 pill a day for 4 days 10/10/17   Sherrie Mustache Roselyn Bering, PA-C  ibuprofen (ADVIL,MOTRIN) 800 MG tablet Take 1 tablet (800 mg total) by mouth every 8 (eight) hours as needed. 05/21/17    Hildred Laser, MD  misoprostol (CYTOTEC) 200 MCG tablet Place four tablets in the vagina at night. Can repeat in 24 hrs if no bleeding. 05/21/17   Hildred Laser, MD    Allergies Amoxicillin; Bee venom; Mushroom extract complex; and Coconut oil  Family History  Problem Relation Age of Onset  . Osteoarthritis Mother   . Asthma Mother   . Hypertension Mother   . Osteoarthritis Father   . Hypertension Father   . Osteoarthritis Maternal Grandmother   . Asthma Maternal Grandmother   . Cancer Maternal Grandmother        breast cancer  . Diabetes Maternal Grandmother   . Heart failure Maternal Grandmother   . Hyperlipidemia Maternal Grandmother   . Hypertension Maternal Grandmother   . Migraines Maternal Grandmother   . Thyroid disease Maternal Grandmother   . Osteoarthritis Paternal Grandmother   . Asthma Paternal Grandmother   . Cancer Paternal Grandmother        stomach  . Diabetes Paternal Grandmother   . Heart failure Paternal Grandmother   . Hyperlipidemia Paternal Grandmother   . Hypertension Paternal Grandmother   . Breast cancer Paternal Aunt   . Hypertension Sister     Social History Social History   Tobacco Use  . Smoking status: Never Smoker  . Smokeless tobacco: Never Used  Substance Use Topics  . Alcohol use: No  . Drug use: No    Review  of Systems  Constitutional: No fever/chills Eyes: No visual changes. ENT: No sore throat.  Positive runny nose and congestion Respiratory: Positive cough Genitourinary: Negative for dysuria. Musculoskeletal: Negative for back pain. Skin: Negative for rash.    ____________________________________________   PHYSICAL EXAM:  VITAL SIGNS: ED Triage Vitals  Enc Vitals Group     BP 10/10/17 1003 110/68     Pulse Rate 10/10/17 1003 91     Resp 10/10/17 1003 16     Temp 10/10/17 1003 98.2 F (36.8 C)     Temp Source 10/10/17 1003 Oral     SpO2 10/10/17 1003 99 %     Weight 10/10/17 1004 180 lb (81.6 kg)     Height  10/10/17 1004 5\' 7"  (1.702 m)     Head Circumference --      Peak Flow --      Pain Score 10/10/17 1003 6     Pain Loc --      Pain Edu? --      Excl. in GC? --     Constitutional: Alert and oriented. Well appearing and in no acute distress.  She is lying on the stretcher talking on her cell phone and making plans for later in the afternoon Eyes: Conjunctivae are normal.  Head: Atraumatic. Ears: Positive for cerumen impactions bilaterally Nose: No congestion/rhinnorhea. Mouth/Throat: Mucous membranes are moist.  Throat is irritated with postnasal drip Cardiovascular: Normal rate, regular rhythm.  Sounds are normal Respiratory: Normal respiratory effort.  No retractions, lungs are clear to auscultation GU: deferred Musculoskeletal: FROM all extremities, warm and well perfused Neurologic:  Normal speech and language.  Skin:  Skin is warm, dry and intact. No rash noted. Psychiatric: Mood and affect are normal. Speech and behavior are normal.  ____________________________________________   LABS (all labs ordered are listed, but only abnormal results are displayed)  Labs Reviewed - No data to display ____________________________________________   ____________________________________________  RADIOLOGY    ____________________________________________   PROCEDURES  Procedure(s) performed: No  Procedures    ____________________________________________   INITIAL IMPRESSION / ASSESSMENT AND PLAN / ED COURSE  Pertinent labs & imaging results that were available during my care of the patient were reviewed by me and considered in my medical decision making (see chart for details).   Patient is a [age] year old female who presents to the ED complaining of cough and congestion for 4-5 days with green mucus.  She denies fever.  On physical exam patient appears well.  Her cough is dry.  Lungs are clear to auscultation and heart sounds are normal   Diagnoses acute upper  respiratory infection.  She states she is out of her albuterol inhaler.  Patient was given a prescription for Z-Pak and albuterol inhaler.  She is to follow-up with her regular doctor if not better in 3-7 days.  She is to use over-the-counter cough medication as needed.  The patient states she understands will comply with our recommendations.  She was discharged in stable condition As part of my medical decision making, I reviewed the following data within the electronic MEDICAL RECORD NUMBER Nursing notes reviewed and incorporated, Old chart reviewed, Notes from prior ED visits  ____________________________________________   FINAL CLINICAL IMPRESSION(S) / ED DIAGNOSES  Final diagnoses:  Acute upper respiratory infection      NEW MEDICATIONS STARTED DURING THIS VISIT:  Discharge Medication List as of 10/10/2017 10:27 AM    START taking these medications   Details  azithromycin (ZITHROMAX Z-PAK) 250 MG tablet 2  pills today then 1 pill a day for 4 days, Print         Note:  This document was prepared using Dragon voice recognition software and may include unintentional dictation errors.    Faythe Ghee, PA-C 10/10/17 1800    Willy Eddy, MD 10/11/17 [id number]

## 2017-10-10 NOTE — Discharge Instructions (Signed)
Follow-up with your regular doctor if you are not better in 3-5 days.  Use medication as prescribed.  He could also take over-the-counter cold medicine as needed.  Return to the emergency department if you are worsening
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
## 2018-06-05 ENCOUNTER — Emergency Department
Admission: EM | Admit: 2018-06-05 | Discharge: 2018-06-06 | Disposition: A | Discharge status: Home / Self Care | Payer: Medicaid Other | Attending: Emergency Medicine | Admitting: Emergency Medicine

## 2018-06-05 ENCOUNTER — Other Ambulatory Visit: Payer: Self-pay

## 2018-06-05 DIAGNOSIS — R110 Nausea: Secondary | ICD-10-CM | POA: Insufficient documentation

## 2018-06-05 DIAGNOSIS — M5412 Radiculopathy, cervical region: Secondary | ICD-10-CM | POA: Diagnosis not present

## 2018-06-05 DIAGNOSIS — Z79899 Other long term (current) drug therapy: Secondary | ICD-10-CM | POA: Insufficient documentation

## 2018-06-05 LAB — URINALYSIS, COMPLETE (UACMP) WITH MICROSCOPIC
BILIRUBIN URINE: NEGATIVE
Glucose, UA: NEGATIVE mg/dL
KETONES UR: NEGATIVE mg/dL
NITRITE: NEGATIVE
PH: 7 (ref 5.0–8.0)
Protein, ur: 30 mg/dL — AB
SPECIFIC GRAVITY, URINE: 1.021 (ref 1.005–1.030)

## 2018-06-05 LAB — COMPREHENSIVE METABOLIC PANEL
ALK PHOS: 37 U/L — AB (ref 38–126)
ALT: 10 U/L (ref 0–44)
ANION GAP: 6 (ref 5–15)
AST: 18 U/L (ref 15–41)
Albumin: 4.1 g/dL (ref 3.5–5.0)
BILIRUBIN TOTAL: 0.6 mg/dL (ref 0.3–1.2)
BUN: 10 mg/dL (ref 6–20)
CALCIUM: 9.2 mg/dL (ref 8.9–10.3)
CO2: 27 mmol/L (ref 22–32)
CREATININE: 0.64 mg/dL (ref 0.44–1.00)
Chloride: 108 mmol/L (ref 98–111)
GFR calc non Af Amer: >60 mL/min (ref >60)
GLUCOSE: 96 mg/dL (ref 70–99)
Potassium: 4 mmol/L (ref 3.5–5.1)
SODIUM: 141 mmol/L (ref 135–145)
TOTAL PROTEIN: 7.3 g/dL (ref 6.5–8.1)

## 2018-06-05 LAB — CBC
HCT: 39.6 % (ref 35.0–47.0)
Hemoglobin: 13.2 g/dL (ref 12.0–16.0)
MCH: 28.9 pg (ref 26.0–34.0)
MCHC: 33.4 g/dL (ref 32.0–36.0)
MCV: 86.7 fL (ref 80.0–100.0)
PLATELETS: 211 10*3/uL (ref 150–440)
RBC: 4.57 MIL/uL (ref 3.80–5.20)
RDW: 13.7 % (ref 11.5–14.5)
WBC: 6.7 10*3/uL (ref 3.6–11.0)

## 2018-06-05 LAB — LIPASE, BLOOD: Lipase: 41 U/L (ref 11–51)

## 2018-06-05 LAB — POCT PREGNANCY, URINE: Preg Test, Ur: NEGATIVE

## 2018-06-05 MED ORDER — PREDNISONE 20 MG PO TABS
60.0000 mg | ORAL_TABLET | Freq: Once | ORAL | Status: AC
  Administered 2018-06-06: 60 mg via ORAL
  Filled 2018-06-05: qty 3

## 2018-06-05 MED ORDER — ONDANSETRON 4 MG PO TBDP
4.0000 mg | ORAL_TABLET | Freq: Once | ORAL | Status: AC | PRN
  Administered 2018-06-05: 4 mg via ORAL
  Filled 2018-06-05: qty 1

## 2018-06-05 NOTE — ED Notes (Signed)
Pt has had nausea for several days. She feels hungry but just can't eat. Her next stated hurting 2 hours before her arrival to the hospital. Ginger ale given

## 2018-06-05 NOTE — ED Triage Notes (Signed)
Pt ambulatory to triage with no difficulty. Pt reports she has a pain to the right side of her neck that feels like a tight muscle. Pt reports started about 2 hours ago. Took tylenol and used heating pad with no relief. Pain worse with movement. Pt also reports she has been nauseated for 2 to 3 days. Unsure if she is pregnant. Reports she generally has to have blood pregnancy test as the urine test never show she is pregnant. Pt however reports 2 days ago she had a positive home pregnancy test.

## 2018-06-05 NOTE — ED Notes (Signed)
Saltine crackers given. Pt states she was able to finish ginger ale

## 2018-06-06 MED ORDER — METHYLPREDNISOLONE 4 MG PO TBPK: ORAL_TABLET | ORAL | 0 refills | Status: DC

## 2018-06-06 MED ORDER — CARISOPRODOL 350 MG PO TABS: 350.0000 mg | ORAL_TABLET | Freq: Three times a day (TID) | ORAL | 0 refills | Status: DC | PRN

## 2018-06-06 NOTE — ED Provider Notes (Addendum)
[facility] Emergency Department Provider Note ____________________________________________   First MD Initiated Contact with Patient 06/05/18 2259     (approximate)  I have reviewed the triage vital signs and the nursing notes.   HISTORY  Chief Complaint Neck Pain and Nausea  HPI Lisa Rowland is a 32 y.o. female with a history of anemia bipolar disorder as well as hypertension was presented to the emergency department today complaining of right-sided neck pain.  She says that the pain is been ongoing over the past 2 hours and worsens with movement.  Says that she has had muscle spasms in the past that have been improved with steroid treatment.  Says that she is also been nauseous over the past 2 to 3 days and is concerned about possibly being pregnant.  Denies any weakness or numbness.  Denies any injury or heavy lifting to cause the pain.  Says that she has had MRIs in the past with herniated disks.  Past Medical History:  Diagnosis Date  . Anemia   . Anxiety   . ASCUS favor benign   . Bipolar 1 disorder (HCC)   . HPV in female   . Hypotension   . Scoliosis     Patient Active Problem List   Diagnosis Date Noted  . Supervision of high-risk pregnancy 05/24/2017  . Headache, migraine 01/23/2014  . 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 Clinical trial participant 11/25/2013  . Opioid dependence (HCC) 10/27/2013  . Myofascial pain 10/10/2013  . Continuous opioid dependence (HCC) 09/22/2013  . Bipolar affective disorder (HCC) 08/19/2013  . Scoliosis 08/19/2013  . Lesion of vulva 08/02/2013  . HPV test positive 06/08/2013  . Cervical pain 04/03/2012    Past Surgical History:  Procedure Laterality Date  . INDUCED ABORTION      Prior to Admission medications   Medication Sig Start Date End Date Taking? Authorizing Provider  albuterol (PROVENTIL HFA;VENTOLIN HFA) 108 (90 Base) MCG/ACT inhaler Inhale 2 puffs into the lungs every 6 (six) hours as  needed for wheezing or shortness of breath. 10/10/17   Fisher, Roselyn BeringSusan W, PA-C  ALPRAZolam Prudy Feeler(XANAX) 0.5 MG tablet Take 0.5 mg by mouth 2 (two) times daily as needed for anxiety.    [provider]  azithromycin (ZITHROMAX Z-PAK) 250 MG tablet 2 pills today then 1 pill a day for 4 days 10/10/17   Sherrie MustacheFisher, Roselyn BeringSusan W, PA-C  ibuprofen (ADVIL,MOTRIN) 800 MG tablet Take 1 tablet (800 mg total) by mouth every 8 (eight) hours as needed. 05/21/17   Hildred Laserherry, Anika, MD  misoprostol (CYTOTEC) 200 MCG tablet Place four tablets in the vagina at night. Can repeat in 24 hrs if no bleeding. 05/21/17   Hildred Laserherry, Anika, MD    Allergies Amoxicillin; Bee venom; Mushroom extract complex; and Coconut oil  Family History  Problem Relation Age of Onset  . Osteoarthritis Mother   . Asthma Mother   . Hypertension Mother   . Osteoarthritis Father   . Hypertension Father   . Osteoarthritis Maternal Grandmother   . Asthma Maternal Grandmother   . Cancer Maternal Grandmother        breast cancer  . Diabetes Maternal Grandmother   . Heart failure Maternal Grandmother   . Hyperlipidemia Maternal Grandmother   . Hypertension Maternal Grandmother   . Migraines Maternal Grandmother   . Thyroid disease Maternal Grandmother   . Osteoarthritis Paternal Grandmother   . Asthma Paternal Grandmother   . Cancer Paternal Grandmother  stomach  . Diabetes Paternal Grandmother   . Heart failure Paternal Grandmother   . Hyperlipidemia Paternal Grandmother   . Hypertension Paternal Grandmother   . Breast cancer Paternal Aunt   . Hypertension Sister     Social History Social History   Tobacco Use  . Smoking status: Never Smoker  . Smokeless tobacco: Never Used  Substance Use Topics  . Alcohol use: No  . Drug use: No    Review of Systems  Constitutional: No fever/chills Eyes: No visual changes. ENT: No sore throat. Cardiovascular: Denies chest pain. Respiratory: Denies shortness of breath. Gastrointestinal: No  abdominal pain.  no vomiting.  No diarrhea.  No constipation. Genitourinary: Negative for dysuria. Musculoskeletal: Negative for back pain. Skin: Negative for rash. Neurological: Negative for headaches, focal weakness or numbness.   ____________________________________________   PHYSICAL EXAM:  VITAL SIGNS: ED Triage Vitals  Enc Vitals Group     BP 06/05/18 1949 105/77     Pulse Rate 06/05/18 1949 87     Resp 06/05/18 1949 18     Temp 06/05/18 1949 99 F (37.2 C)     Temp Source 06/05/18 1949 Oral     SpO2 06/05/18 1949 99 %     Weight 06/05/18 1950 198 lb (89.8 kg)     Height 06/05/18 1950 5\' 7"  (1.702 m)     Head Circumference --      Peak Flow --      Pain Score 06/05/18 1950 8     Pain Loc --      Pain Edu? --      Excl. in GC? --     Constitutional: Alert and oriented. Well appearing and in no acute distress. Eyes: Conjunctivae are normal.  Head: Atraumatic. Nose: No congestion/rhinnorhea. Mouth/Throat: Mucous membranes are moist.  Neck: No stridor.  Tenderness to palpation over the right-sided trapezius from the occiput down to the acromion. Cardiovascular: Normal rate, regular rhythm. Grossly normal heart sounds.  Good peripheral circulation with equal and bilateral radial pulses. Respiratory: Normal respiratory effort.  No retractions. Lungs CTAB. Gastrointestinal: Soft and nontender. No distention.  Musculoskeletal: No lower extremity tenderness nor edema.  No joint effusions. Neurologic:  Normal speech and language. No gross focal neurologic deficits are appreciated.  5 out of 5 strength bilateral upper remedies.  No strength deficit. Skin:  Skin is warm, dry and intact. No rash noted. Psychiatric: Mood and affect are normal. Speech and behavior are normal.  ____________________________________________   LABS (all labs ordered are listed, but only abnormal results are displayed)  Labs Reviewed  COMPREHENSIVE METABOLIC PANEL - Abnormal; Notable for the  following components:      Result Value   Alkaline Phosphatase 37 (*)    All other components within normal limits  URINALYSIS, COMPLETE (UACMP) WITH MICROSCOPIC - Abnormal; Notable for the following components:   Color, Urine YELLOW (*)    APPearance CLOUDY (*)    Hgb urine dipstick LARGE (*)    Protein, ur 30 (*)    Leukocytes, UA TRACE (*)    Bacteria, UA RARE (*)    All other components within normal limits  LIPASE, BLOOD  CBC  POC URINE PREG, ED  POCT PREGNANCY, URINE   ____________________________________________  EKG   ____________________________________________  RADIOLOGY   ____________________________________________   PROCEDURES  Procedure(s) performed:   Procedures  Critical Care performed:   ____________________________________________   INITIAL IMPRESSION / ASSESSMENT AND PLAN / ED COURSE  Pertinent labs & imaging results that were  available during my care of the patient were reviewed by me and considered in my medical decision making (see chart for details).  DDX: Radiculopathy, nausea and vomiting, nausea secondary to pain, pregnancy, muscle spasm As part of my medical decision making, I reviewed the following data within the electronic MEDICAL RECORD NUMBER Notes from prior ED visits  ----------------------------------------- 12:25 AM on 06/06/2018 -----------------------------------------  Patient to be discharged home with Medrol Dosepak as well as muscle relaxer.  She states that Tylenol and ibuprofen did not work prior to arrival and that she is still having pain.  Prior test is negative and labs are otherwise reassuring.  Likely musculoskeletal.  Patient will follow-up with primary care and I will also give follow-up with neurosurgery.  Patient understand the diagnosis as well as treatment and willing to comply.  Also the nausea has resolved after the patient has eaten crackers and ginger  ale. ____________________________________________   FINAL CLINICAL IMPRESSION(S) / ED DIAGNOSES  Nausea.  Cervical radiculopathy.  NEW MEDICATIONS STARTED DURING THIS VISIT:  New Prescriptions   No medications on file     Note:  This document was prepared using Dragon voice recognition software and may include unintentional dictation errors.     Myrna Blazer, MD 06/06/18 1610    Myrna Blazer, MD 06/06/18 Jacinta Shoe

## 2018-07-10 ENCOUNTER — Emergency Department
Admission: EM | Admit: 2018-07-10 | Discharge: 2018-07-10 | Discharge status: Against Medical Advice | Payer: Medicaid Other

## 2018-07-15 ENCOUNTER — Encounter: Payer: Self-pay | Admitting: Emergency Medicine

## 2018-07-15 ENCOUNTER — Other Ambulatory Visit: Payer: Self-pay

## 2018-07-15 ENCOUNTER — Emergency Department
Admission: EM | Admit: 2018-07-15 | Discharge: 2018-07-15 | Disposition: A | Discharge status: Home / Self Care | Payer: Medicaid Other | Attending: Emergency Medicine | Admitting: Emergency Medicine

## 2018-07-15 DIAGNOSIS — R110 Nausea: Secondary | ICD-10-CM | POA: Insufficient documentation

## 2018-07-15 DIAGNOSIS — Z3200 Encounter for pregnancy test, result unknown: Secondary | ICD-10-CM | POA: Insufficient documentation

## 2018-07-15 LAB — COMPREHENSIVE METABOLIC PANEL
ALK PHOS: 34 U/L — AB (ref 38–126)
ALT: 10 U/L (ref 0–44)
AST: 14 U/L — ABNORMAL LOW (ref 15–41)
Albumin: 4.3 g/dL (ref 3.5–5.0)
Anion gap: 4 — ABNORMAL LOW (ref 5–15)
BILIRUBIN TOTAL: 0.9 mg/dL (ref 0.3–1.2)
BUN: 7 mg/dL (ref 6–20)
CALCIUM: 9 mg/dL (ref 8.9–10.3)
CO2: 28 mmol/L (ref 22–32)
CREATININE: 0.66 mg/dL (ref 0.44–1.00)
Chloride: 106 mmol/L (ref 98–111)
Glucose, Bld: 86 mg/dL (ref 70–99)
Potassium: 3.9 mmol/L (ref 3.5–5.1)
Sodium: 138 mmol/L (ref 135–145)
Total Protein: 7.6 g/dL (ref 6.5–8.1)

## 2018-07-15 LAB — URINALYSIS, COMPLETE (UACMP) WITH MICROSCOPIC
BILIRUBIN URINE: NEGATIVE
Bacteria, UA: NONE SEEN
Glucose, UA: NEGATIVE mg/dL
Hgb urine dipstick: NEGATIVE
Ketones, ur: NEGATIVE mg/dL
LEUKOCYTES UA: NEGATIVE
Nitrite: NEGATIVE
PH: 6 (ref 5.0–8.0)
Protein, ur: NEGATIVE mg/dL
SPECIFIC GRAVITY, URINE: 1.017 (ref 1.005–1.030)

## 2018-07-15 LAB — CBC
HCT: 40.5 % (ref 36.0–46.0)
Hemoglobin: 12.9 g/dL (ref 12.0–15.0)
MCH: 28.2 pg (ref 26.0–34.0)
MCHC: 31.9 g/dL (ref 30.0–36.0)
MCV: 88.6 fL (ref 80.0–100.0)
NRBC: 0 % (ref 0.0–0.2)
PLATELETS: 222 10*3/uL (ref 150–400)
RBC: 4.57 MIL/uL (ref 3.87–5.11)
RDW: 13 % (ref 11.5–15.5)
WBC: 5.4 10*3/uL (ref 4.0–10.5)

## 2018-07-15 LAB — HCG, QUANTITATIVE, PREGNANCY: hCG, Beta Chain, Quant, S: <1 m[IU]/mL (ref <5)

## 2018-07-15 LAB — POCT PREGNANCY, URINE: PREG TEST UR: NEGATIVE

## 2018-07-15 LAB — LIPASE, BLOOD: Lipase: 38 U/L (ref 11–51)

## 2018-07-15 MED ORDER — OMEPRAZOLE 20 MG PO CPDR: 20.0000 mg | DELAYED_RELEASE_CAPSULE | Freq: Every day | ORAL | 1 refills | Status: DC

## 2018-07-15 MED ORDER — ONDANSETRON 4 MG PO TBDP
4.0000 mg | ORAL_TABLET | Freq: Once | ORAL | Status: AC | PRN
  Administered 2018-07-15: 4 mg via ORAL
  Filled 2018-07-15: qty 1

## 2018-07-15 NOTE — ED Notes (Addendum)
Pt nauseous. States she wants to eat but when she does it makes her nausea inc. Pt states she doesn't throw up but stops eating anyway bc she can't tolerate the nausea.

## 2018-07-15 NOTE — ED Triage Notes (Signed)
Patient to ER for c/o nausea x2 weeks. Denies any abd pain or vomiting as of yet. Denies any fevers.

## 2018-07-15 NOTE — ED Provider Notes (Signed)
[facility] Emergency Department Provider Note   ____________________________________________    I have reviewed the triage vital signs and the nursing notes.   HISTORY  Chief Complaint Nausea     HPI Lisa Rowland is a 32 y.o. female who presents with complaints of nausea.  Patient reports that she has been mildly nauseated over the last 2 weeks.  This is fairly intermittent.  She states it makes her quite anxious.  She states that when she is pregnant her urine tests are always "negative ".  Denies abdominal pain.  No fevers or chills.  She has had this in the past as well.  Past Medical History:  Diagnosis Date  . Anemia   . Anxiety   . ASCUS favor benign   . Bipolar 1 disorder (HCC)   . HPV in female   . Hypotension   . Scoliosis     Patient Active Problem List   Diagnosis Date Noted  . Supervision of high-risk pregnancy 05/24/2017  . Headache, migraine 01/23/2014  . 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 Clinical trial participant 11/25/2013  . Opioid dependence (HCC) 10/27/2013  . Myofascial pain 10/10/2013  . Continuous opioid dependence (HCC) 09/22/2013  . Bipolar affective disorder (HCC) 08/19/2013  . Scoliosis 08/19/2013  . Lesion of vulva 08/02/2013  . HPV test positive 06/08/2013  . Cervical pain 04/03/2012    Past Surgical History:  Procedure Laterality Date  . INDUCED ABORTION      Prior to Admission medications   Medication Sig Start Date End Date Taking? Authorizing Provider  albuterol (PROVENTIL HFA;VENTOLIN HFA) 108 (90 Base) MCG/ACT inhaler Inhale 2 puffs into the lungs every 6 (six) hours as needed for wheezing or shortness of breath. 10/10/17   Fisher, Roselyn Bering, PA-C  ALPRAZolam Prudy Feeler) 0.5 MG tablet Take 0.5 mg by mouth 2 (two) times daily as needed for anxiety.    [provider]  azithromycin (ZITHROMAX Z-PAK) 250 MG tablet 2 pills today then 1 pill a day for 4 days 10/10/17   Sherrie Mustache Roselyn Bering, PA-C    carisoprodol (SOMA) 350 MG tablet Take 1 tablet (350 mg total) by mouth 3 (three) times daily as needed. 06/06/18   Schaevitz, Myra Rude, MD  ibuprofen (ADVIL,MOTRIN) 800 MG tablet Take 1 tablet (800 mg total) by mouth every 8 (eight) hours as needed. 05/21/17   Hildred Laser, MD  methylPREDNISolone (MEDROL DOSEPAK) 4 MG TBPK tablet Disp 4mg  dosepak.  Follow instructions on packaging. 06/06/18   Myrna Blazer, MD  misoprostol (CYTOTEC) 200 MCG tablet Place four tablets in the vagina at night. Can repeat in 24 hrs if no bleeding. 05/21/17   Hildred Laser, MD  omeprazole (PRILOSEC) 20 MG capsule Take 1 capsule (20 mg total) by mouth daily. 07/15/18 07/15/19  Jene Every, MD     Allergies Amoxicillin; Bee venom; Mushroom extract complex; and Other  Family History  Problem Relation Age of Onset  . Osteoarthritis Mother   . Asthma Mother   . Hypertension Mother   . Osteoarthritis Father   . Hypertension Father   . Osteoarthritis Maternal Grandmother   . Asthma Maternal Grandmother   . Cancer Maternal Grandmother        breast cancer  . Diabetes Maternal Grandmother   . Heart failure Maternal Grandmother   . Hyperlipidemia Maternal Grandmother   . Hypertension Maternal Grandmother   . Migraines Maternal Grandmother   . Thyroid disease Maternal Grandmother   . Osteoarthritis Paternal Grandmother   .  Asthma Paternal Grandmother   . Cancer Paternal Grandmother        stomach  . Diabetes Paternal Grandmother   . Heart failure Paternal Grandmother   . Hyperlipidemia Paternal Grandmother   . Hypertension Paternal Grandmother   . Breast cancer Paternal Aunt   . Hypertension Sister     Social History Social History   Tobacco Use  . Smoking status: Never Smoker  . Smokeless tobacco: Never Used  Substance Use Topics  . Alcohol use: No  . Drug use: No    Review of Systems  Constitutional: No fever Eyes: No visual changes.  ENT: No sore throat. Cardiovascular:  Denies chest pain. Respiratory: No cough Gastrointestinal: As above Genitourinary: Negative for dysuria. Musculoskeletal: Negative for back pain. Skin: Negative for rash. Neurological: Negative for headaches   ____________________________________________   PHYSICAL EXAM:  VITAL SIGNS: ED Triage Vitals  Enc Vitals Group     BP 07/15/18 1253 118/62     Pulse Rate 07/15/18 1253 70     Resp 07/15/18 1253 20     Temp 07/15/18 1253 98.6 F (37 C)     Temp Source 07/15/18 1253 Oral     SpO2 07/15/18 1253 100 %     Weight 07/15/18 1253 90.7 kg (200 lb)     Height 07/15/18 1253 1.702 m (5\' 7" )     Head Circumference --      Peak Flow --      Pain Score 07/15/18 1255 0     Pain Loc --      Pain Edu? --      Excl. in GC? --     Constitutional: Alert and oriented. Eyes: Conjunctivae are normal.  . Nose: No congestion/rhinnorhea. Mouth/Throat: Mucous membranes are moist.    Cardiovascular: Normal rate, regular rhythm.  Good peripheral circulation. Respiratory: Normal respiratory effort.  No retractions. Gastrointestinal: Soft and nontender. No distention.  No CVA tenderness.  Musculoskeletal: Warm and well perfused Neurologic:  Normal speech and language. No gross focal neurologic deficits are appreciated.  Skin:  Skin is warm, dry and intact. No rash noted. Psychiatric: Mood and affect are normal. Speech and behavior are normal.  ____________________________________________   LABS (all labs ordered are listed, but only abnormal results are displayed)  Labs Reviewed  COMPREHENSIVE METABOLIC PANEL - Abnormal; Notable for the following components:      Result Value   AST 14 (*)    Alkaline Phosphatase 34 (*)    Anion gap 4 (*)    All other components within normal limits  URINALYSIS, COMPLETE (UACMP) WITH MICROSCOPIC - Abnormal; Notable for the following components:   Color, Urine YELLOW (*)    APPearance HAZY (*)    All other components within normal limits  LIPASE,  BLOOD  CBC  HCG, QUANTITATIVE, PREGNANCY  POC URINE PREG, ED  POCT PREGNANCY, URINE   ____________________________________________  EKG  None ____________________________________________  RADIOLOGY   ____________________________________________   PROCEDURES  Procedure(s) performed: No  Procedures   Critical Care performed: No ____________________________________________   INITIAL IMPRESSION / ASSESSMENT AND PLAN / ED COURSE  Pertinent labs & imaging results that were available during my care of the patient were reviewed by me and considered in my medical decision making (see chart for details).  Patient well-appearing in no acute distress.  Exam is unremarkable.  No abdominal tenderness.  No epigastric tenderness.  Lab work is benign.  Pending beta-hCG sent by triage.  Suspect part anxiety, possibly gastritis related.  Will  start on PPI if hCG negative.    ____________________________________________   FINAL CLINICAL IMPRESSION(S) / ED DIAGNOSES  Final diagnoses:  Nausea        Note:  This document was prepared using Dragon voice recognition software and may include unintentional dictation errors.    Jene Every, MD 07/15/18 [phone]

## 2018-09-19 ENCOUNTER — Emergency Department: Payer: Medicaid Other

## 2018-09-19 ENCOUNTER — Emergency Department
Admission: EM | Admit: 2018-09-19 | Discharge: 2018-09-19 | Disposition: A | Discharge status: Home / Self Care | Payer: Medicaid Other | Attending: Emergency Medicine | Admitting: Emergency Medicine

## 2018-09-19 ENCOUNTER — Other Ambulatory Visit: Payer: Self-pay

## 2018-09-19 DIAGNOSIS — Z79899 Other long term (current) drug therapy: Secondary | ICD-10-CM | POA: Diagnosis not present

## 2018-09-19 DIAGNOSIS — M79602 Pain in left arm: Secondary | ICD-10-CM | POA: Insufficient documentation

## 2018-09-19 DIAGNOSIS — W010XXA Fall on same level from slipping, tripping and stumbling without subsequent striking against object, initial encounter: Secondary | ICD-10-CM | POA: Insufficient documentation

## 2018-09-19 MED ORDER — MELOXICAM 15 MG PO TABS: 15.0000 mg | ORAL_TABLET | Freq: Every day | ORAL | 1 refills | Status: AC

## 2018-09-19 MED ORDER — KETOROLAC TROMETHAMINE 30 MG/ML IJ SOLN
30.0000 mg | Freq: Once | INTRAMUSCULAR | Status: AC
  Administered 2018-09-19: 30 mg via INTRAMUSCULAR
  Filled 2018-09-19: qty 1

## 2018-09-19 NOTE — ED Notes (Signed)
Pt reports tripping over blanket last night and landed on elbow, pain increased with ROM exercises.

## 2018-09-19 NOTE — ED Provider Notes (Signed)
[facility] Emergency Department Provider Note  ____________________________________________  Time seen: Approximately 11:21 AM  I have reviewed the triage vital signs and the nursing notes.   HISTORY  Chief Complaint Arm Pain    HPI Lisa Rowland is a 33 y.o. female presents to the ED with acute aching 8/10 left elbow pain. Patient reports that she tripped over a blanket last night and fell onto elbow. No numbness or tingling of the left upper extremity. No subjective weakness. No prior left upper extremity fractures. No alleviating measures have been attempted.    Past Medical History:  Diagnosis Date  . Anemia   . Anxiety   . ASCUS favor benign   . Bipolar 1 disorder (HCC)   . HPV in female   . Hypotension   . Scoliosis     Patient Active Problem List   Diagnosis Date Noted  . Supervision of high-risk pregnancy 05/24/2017  . Headache, migraine 01/23/2014  . 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 Clinical trial participant 11/25/2013  . Opioid dependence (HCC) 10/27/2013  . Myofascial pain 10/10/2013  . Continuous opioid dependence (HCC) 09/22/2013  . Bipolar affective disorder (HCC) 08/19/2013  . Scoliosis 08/19/2013  . Lesion of vulva 08/02/2013  . HPV test positive 06/08/2013  . Cervical pain 04/03/2012    Past Surgical History:  Procedure Laterality Date  . INDUCED ABORTION      Prior to Admission medications   Medication Sig Start Date End Date Taking? Authorizing Provider  albuterol (PROVENTIL HFA;VENTOLIN HFA) 108 (90 Base) MCG/ACT inhaler Inhale 2 puffs into the lungs every 6 (six) hours as needed for wheezing or shortness of breath. 10/10/17   Fisher, Roselyn BeringSusan W, PA-C  ALPRAZolam Prudy Feeler(XANAX) 0.5 MG tablet Take 0.5 mg by mouth 2 (two) times daily as needed for anxiety.    [provider]  azithromycin (ZITHROMAX Z-PAK) 250 MG tablet 2 pills today then 1 pill a day for 4 days 10/10/17   Sherrie MustacheFisher, Roselyn BeringSusan W, PA-C  carisoprodol  (SOMA) 350 MG tablet Take 1 tablet (350 mg total) by mouth 3 (three) times daily as needed. 06/06/18   Schaevitz, Myra Rudeavid Matthew, MD  ibuprofen (ADVIL,MOTRIN) 800 MG tablet Take 1 tablet (800 mg total) by mouth every 8 (eight) hours as needed. 05/21/17   Hildred Laserherry, Anika, MD  meloxicam (MOBIC) 15 MG tablet Take 1 tablet (15 mg total) by mouth daily for 7 days. 09/19/18 09/26/18  Orvil FeilWoods, Jaclyn M, PA-C  methylPREDNISolone (MEDROL DOSEPAK) 4 MG TBPK tablet Disp 4mg  dosepak.  Follow instructions on packaging. 06/06/18   Myrna BlazerSchaevitz, David Matthew, MD  misoprostol (CYTOTEC) 200 MCG tablet Place four tablets in the vagina at night. Can repeat in 24 hrs if no bleeding. 05/21/17   Hildred Laserherry, Anika, MD  omeprazole (PRILOSEC) 20 MG capsule Take 1 capsule (20 mg total) by mouth daily. 07/15/18 07/15/19  Jene EveryKinner, Robert, MD    Allergies Amoxicillin; Bee venom; Mushroom extract complex; and Other  Family History  Problem Relation Age of Onset  . Osteoarthritis Mother   . Asthma Mother   . Hypertension Mother   . Osteoarthritis Father   . Hypertension Father   . Osteoarthritis Maternal Grandmother   . Asthma Maternal Grandmother   . Cancer Maternal Grandmother        breast cancer  . Diabetes Maternal Grandmother   . Heart failure Maternal Grandmother   . Hyperlipidemia Maternal Grandmother   . Hypertension Maternal Grandmother   . Migraines Maternal Grandmother   . Thyroid disease  Maternal Grandmother   . Osteoarthritis Paternal Grandmother   . Asthma Paternal Grandmother   . Cancer Paternal Grandmother        stomach  . Diabetes Paternal Grandmother   . Heart failure Paternal Grandmother   . Hyperlipidemia Paternal Grandmother   . Hypertension Paternal Grandmother   . Breast cancer Paternal Aunt   . Hypertension Sister     Social History Social History   Tobacco Use  . Smoking status: Never Smoker  . Smokeless tobacco: Never Used  Substance Use Topics  . Alcohol use: No  . Drug use: No      Review of Systems  Constitutional: No fever/chills Eyes: No visual changes. No discharge ENT: No upper respiratory complaints. Cardiovascular: no chest pain. Respiratory: no cough. No SOB. Gastrointestinal: No abdominal pain.  No nausea, no vomiting.  No diarrhea.  No constipation. Musculoskeletal: Patient has left elbow pain.  Skin: Negative for rash, abrasions, lacerations, ecchymosis. Neurological: Negative for headaches, focal weakness or numbness.   ____________________________________________   PHYSICAL EXAM:  VITAL SIGNS: ED Triage Vitals  Enc Vitals Group     BP --      Pulse --      Resp --      Temp --      Temp src --      SpO2 --      Weight 09/19/18 0942 208 lb (94.3 kg)     Height 09/19/18 0942 5\' 7"  (1.702 m)     Head Circumference --      Peak Flow --      Pain Score 09/19/18 0937 8     Pain Loc --      Pain Edu? --      Excl. in GC? --      Constitutional: Alert and oriented. Well appearing and in no acute distress. Eyes: Conjunctivae are normal. PERRL. EOMI. Head: Atraumatic. Cardiovascular: Normal rate, regular rhythm. Normal S1 and S2.  Good peripheral circulation. Respiratory: Normal respiratory effort without tachypnea or retractions. Lungs CTAB. Good air entry to the bases with no decreased or absent breath sounds. Gastrointestinal: Bowel sounds 4 quadrants. Soft and nontender to palpation. No guarding or rigidity. No palpable masses. No distention. No CVA tenderness. Musculoskeletal: 5/5 strength in the upper and lower extremities bilaterally and symmetrically.  Patient is able to perform pronation and supination without difficulty.  She has tenderness to palpation over the left proximal forearm and left elbow.  Palpable radial pulse, left. Neurologic:  Normal speech and language. No gross focal neurologic deficits are appreciated.  Skin:  Skin is warm, dry and intact. No rash noted. Psychiatric: Mood and affect are normal. Speech and  behavior are normal. Patient exhibits appropriate insight and judgement.   ____________________________________________   LABS (all labs ordered are listed, but only abnormal results are displayed)  Labs Reviewed - No data to display ____________________________________________  EKG   ____________________________________________  RADIOLOGY I personally viewed and evaluated these images as part of my medical decision making, as well as reviewing the written report by the radiologist.  Dg Elbow Complete Left  Result Date: 09/19/2018 CLINICAL DATA:  Fall last night landing on left elbow with persistent pain. EXAM: LEFT ELBOW - COMPLETE 3+ VIEW COMPARISON:  None. FINDINGS: There is no evidence of fracture, dislocation, or joint effusion. There is no evidence of arthropathy or other focal bone abnormality. Soft tissues are unremarkable. IMPRESSION: Negative. Electronically Signed   By: Elberta Fortisaniel  Boyle M.D.   On: 09/19/2018 10:32  ____________________________________________    PROCEDURES  Procedure(s) performed:    Procedures    Medications  ketorolac (TORADOL) 30 MG/ML injection 30 mg (30 mg Intramuscular Given 09/19/18 1120)     ____________________________________________   INITIAL IMPRESSION / ASSESSMENT AND PLAN / ED COURSE  Pertinent labs & imaging results that were available during my care of the patient were reviewed by me and considered in my medical decision making (see chart for details).  Review of the [place] CSRS was performed in accordance of the [facility] prior to dispensing any controlled drugs.      Assessment and plan Left elbow pain Patient presents to the emergency department with acute 8 out of 10 left elbow pain after patient fell on left elbow last night.  X-ray examination of the left elbow reveals no acute bony abnormality.  Patient was discharged with meloxicam.  She was advised to follow-up with orthopedics this morning.  Injection of Toradol was given  in the emergency department prior to discharge. All patient questions were answered.      ____________________________________________  FINAL CLINICAL IMPRESSION(S) / ED DIAGNOSES  Final diagnoses:  Left arm pain      NEW MEDICATIONS STARTED DURING THIS VISIT:  ED Discharge Orders         Ordered    meloxicam (MOBIC) 15 MG tablet  Daily     09/19/18 1118              This chart was dictated using voice recognition software/Dragon. Despite best efforts to proofread, errors can occur which can change the meaning. Any change was purely unintentional.    Gasper Lloyd 09/19/18 1131    Jene Every, MD 09/19/18 1202

## 2018-09-19 NOTE — ED Triage Notes (Signed)
Pt states that last night she tripped over a blanket and fell landing on her left elbow, pt states that she was unable to sleep due to the pain, pt is able to move all her fingers, states pain increases with supination and pronation of the arm, as well as attempting to raise the arm, +radial pulse, +sensation to all fingers, no obvious swelling or deformity, pt tender to touch at the elbow

## 2018-11-08 ENCOUNTER — Other Ambulatory Visit: Payer: Self-pay

## 2018-11-08 ENCOUNTER — Emergency Department: Payer: Medicaid Other

## 2018-11-08 ENCOUNTER — Emergency Department
Admission: EM | Admit: 2018-11-08 | Discharge: 2018-11-08 | Disposition: A | Discharge status: Home / Self Care | Payer: Medicaid Other | Attending: Emergency Medicine | Admitting: Emergency Medicine

## 2018-11-08 ENCOUNTER — Encounter: Payer: Self-pay | Admitting: Emergency Medicine

## 2018-11-08 DIAGNOSIS — W1830XA Fall on same level, unspecified, initial encounter: Secondary | ICD-10-CM | POA: Diagnosis not present

## 2018-11-08 DIAGNOSIS — S6991XA Unspecified injury of right wrist, hand and finger(s), initial encounter: Secondary | ICD-10-CM | POA: Diagnosis present

## 2018-11-08 DIAGNOSIS — Y999 Unspecified external cause status: Secondary | ICD-10-CM | POA: Diagnosis not present

## 2018-11-08 DIAGNOSIS — Z79899 Other long term (current) drug therapy: Secondary | ICD-10-CM | POA: Diagnosis not present

## 2018-11-08 DIAGNOSIS — Y939 Activity, unspecified: Secondary | ICD-10-CM | POA: Insufficient documentation

## 2018-11-08 DIAGNOSIS — M20021 Boutonniere deformity of right finger(s): Secondary | ICD-10-CM | POA: Insufficient documentation

## 2018-11-08 DIAGNOSIS — Y929 Unspecified place or not applicable: Secondary | ICD-10-CM | POA: Insufficient documentation

## 2018-11-08 NOTE — ED Notes (Signed)
Finger splint applied to right middle finger.  D/c inst to pt.  Pt verbally understood and acknowledges d/c inst.

## 2018-11-08 NOTE — ED Provider Notes (Signed)
[facility] Emergency Department Provider Note  ____________________________________________  Time seen: Approximately 11:17 PM  I have reviewed the triage vital signs and the nursing notes.   HISTORY  Chief Complaint Finger Injury    HPI Lisa Rowland is a 33 y.o. female presents to the emergency department with acute on chronic right middle finger pain.  Patient reportedly fell on an outstretched right hand 1 week ago and reports that her right middle finger hurt initially but her elbow hurt worse so she forgot to seek care for it.  Patient reports that her right middle finger has been held in flexion at the PIP joint and she is unable to extend digit.  She denies numbness or tingling in the right hand.  Patient has been taking ibuprofen at home. No other alleviating measures have been attempted.    Past Medical History:  Diagnosis Date  . Anemia   . Anxiety   . ASCUS favor benign   . Bipolar 1 disorder (HCC)   . HPV in female   . Hypotension   . Scoliosis     Patient Active Problem List   Diagnosis Date Noted  . Supervision of high-risk pregnancy 05/24/2017  . Headache, migraine 01/23/2014  . 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 Clinical trial participant 11/25/2013  . Opioid dependence (HCC) 10/27/2013  . Myofascial pain 10/10/2013  . Continuous opioid dependence (HCC) 09/22/2013  . Bipolar affective disorder (HCC) 08/19/2013  . Scoliosis 08/19/2013  . Lesion of vulva 08/02/2013  . HPV test positive 06/08/2013  . Cervical pain 04/03/2012    Past Surgical History:  Procedure Laterality Date  . INDUCED ABORTION      Prior to Admission medications   Medication Sig Start Date End Date Taking? Authorizing Provider  albuterol (PROVENTIL HFA;VENTOLIN HFA) 108 (90 Base) MCG/ACT inhaler Inhale 2 puffs into the lungs every 6 (six) hours as needed for wheezing or shortness of breath. 10/10/17   Fisher, Roselyn Bering, PA-C  ALPRAZolam Prudy Feeler) 0.5 MG  tablet Take 0.5 mg by mouth 2 (two) times daily as needed for anxiety.    [provider]  azithromycin (ZITHROMAX Z-PAK) 250 MG tablet 2 pills today then 1 pill a day for 4 days 10/10/17   Sherrie Mustache Roselyn Bering, PA-C  carisoprodol (SOMA) 350 MG tablet Take 1 tablet (350 mg total) by mouth 3 (three) times daily as needed. 06/06/18   Schaevitz, Myra Rude, MD  ibuprofen (ADVIL,MOTRIN) 800 MG tablet Take 1 tablet (800 mg total) by mouth every 8 (eight) hours as needed. 05/21/17   Hildred Laser, MD  methylPREDNISolone (MEDROL DOSEPAK) 4 MG TBPK tablet Disp 4mg  dosepak.  Follow instructions on packaging. 06/06/18   Myrna Blazer, MD  misoprostol (CYTOTEC) 200 MCG tablet Place four tablets in the vagina at night. Can repeat in 24 hrs if no bleeding. 05/21/17   Hildred Laser, MD  omeprazole (PRILOSEC) 20 MG capsule Take 1 capsule (20 mg total) by mouth daily. 07/15/18 07/15/19  Jene Every, MD    Allergies Amoxicillin; Bee venom; Mushroom extract complex; and Other  Family History  Problem Relation Age of Onset  . Osteoarthritis Mother   . Asthma Mother   . Hypertension Mother   . Osteoarthritis Father   . Hypertension Father   . Osteoarthritis Maternal Grandmother   . Asthma Maternal Grandmother   . Cancer Maternal Grandmother        breast cancer  . Diabetes Maternal Grandmother   . Heart failure Maternal Grandmother   .  Hyperlipidemia Maternal Grandmother   . Hypertension Maternal Grandmother   . Migraines Maternal Grandmother   . Thyroid disease Maternal Grandmother   . Osteoarthritis Paternal Grandmother   . Asthma Paternal Grandmother   . Cancer Paternal Grandmother        stomach  . Diabetes Paternal Grandmother   . Heart failure Paternal Grandmother   . Hyperlipidemia Paternal Grandmother   . Hypertension Paternal Grandmother   . Breast cancer Paternal Aunt   . Hypertension Sister     Social History Social History   Tobacco Use  . Smoking status: Never  Smoker  . Smokeless tobacco: Never Used  Substance Use Topics  . Alcohol use: No  . Drug use: No     Review of Systems  Constitutional: No fever/chills Eyes: No visual changes. No discharge ENT: No upper respiratory complaints. Cardiovascular: no chest pain. Respiratory: no cough. No SOB. Gastrointestinal: No abdominal pain.  No nausea, no vomiting.  No diarrhea.  No constipation. Musculoskeletal: Patient has right middle finger pain.  Skin: Negative for rash, abrasions, lacerations, ecchymosis. Neurological: Negative for headaches, focal weakness or numbness.   ____________________________________________   PHYSICAL EXAM:  VITAL SIGNS: ED Triage Vitals  Enc Vitals Group     BP 11/08/18 2056 117/66     Pulse Rate 11/08/18 2056 81     Resp 11/08/18 2056 18     Temp 11/08/18 2056 98 F (36.7 C)     Temp Source 11/08/18 2056 Oral     SpO2 11/08/18 2056 100 %     Weight 11/08/18 2055 200 lb (90.7 kg)     Height 11/08/18 2055 5\' 7"  (1.702 m)     Head Circumference --      Peak Flow --      Pain Score 11/08/18 2056 7     Pain Loc --      Pain Edu? --      Excl. in GC? --      Constitutional: Alert and oriented. Well appearing and in no acute distress. Eyes: Conjunctivae are normal. PERRL. EOMI. Head: Atraumatic. Cardiovascular: Normal rate, regular rhythm. Normal S1 and S2.  Good peripheral circulation. Respiratory: Normal respiratory effort without tachypnea or retractions. Lungs CTAB. Good air entry to the bases with no decreased or absent breath sounds. Musculoskeletal: Patient has boutonniere deformity of right middle finger.  Patient cannot perform extension at PIP joint of right middle finger.  Palpable radial pulse, right. Neurologic:  Normal speech and language. No gross focal neurologic deficits are appreciated.  Skin:  Skin is warm, dry and intact. No rash noted. Psychiatric: Mood and affect are normal. Speech and behavior are normal. Patient exhibits  appropriate insight and judgement.   ____________________________________________   LABS (all labs ordered are listed, but only abnormal results are displayed)  Labs Reviewed  POC URINE PREG, ED   ____________________________________________  EKG   ____________________________________________  RADIOLOGY I personally viewed and evaluated these images as part of my medical decision making, as well as reviewing the written report by the radiologist.  Dg Finger Middle Right  Result Date: 11/08/2018 CLINICAL DATA:  Fall 1 month ago.  Right middle finger pain. EXAM: RIGHT MIDDLE FINGER 2+V COMPARISON:  None. FINDINGS: There is no evidence of fracture or dislocation. There is no evidence of arthropathy or other focal bone abnormality. Soft tissues are unremarkable. IMPRESSION: Negative. Electronically Signed   By: Charlett Nose M.D.   On: 11/08/2018 21:49    ____________________________________________    PROCEDURES  Procedure(s) performed:    Procedures    Medications - No data to display   ____________________________________________   INITIAL IMPRESSION / ASSESSMENT AND PLAN / ED COURSE  Pertinent labs & imaging results that were available during my care of the patient were reviewed by me and considered in my medical decision making (see chart for details).  Review of the [place] CSRS was performed in accordance of the [facility] prior to dispensing any controlled drugs.      Assessment and plan Central slip injury Patient presents to the emergency department with a boutonniere deformity of the right middle finger.  On physical exam, patient was unable to perform extension at the PIP joint.  Tylenol was recommended for discomfort as patient is unsure about possibility of pregnancy.  Patient was advised to follow-up with Dr. Stephenie Acres.  All patient questions were answered.     ____________________________________________  FINAL CLINICAL IMPRESSION(S) / ED DIAGNOSES  Final  diagnoses:  Central slip extensor tendon injury (boutonniere), right      NEW MEDICATIONS STARTED DURING THIS VISIT:  ED Discharge Orders    None          This chart was dictated using voice recognition software/Dragon. Despite best efforts to proofread, errors can occur which can change the meaning. Any change was purely unintentional.    Orvil Feil, PA-C 11/08/18 2321    Minna Antis, MD 11/09/18 [phone]

## 2018-11-08 NOTE — ED Triage Notes (Signed)
Patient ambulatory to triage with steady gait, without difficulty or distress noted; pt reports mo ago fell on right middle finger; cont to have pain

## 2018-11-08 NOTE — ED Notes (Signed)
Patient transported to X-ray 

## 2019-04-03 ENCOUNTER — Other Ambulatory Visit: Payer: Self-pay

## 2019-04-03 ENCOUNTER — Emergency Department
Admission: EM | Admit: 2019-04-03 | Discharge: 2019-04-03 | Disposition: A | Discharge status: Home / Self Care | Payer: Medicaid Other | Attending: Emergency Medicine | Admitting: Emergency Medicine

## 2019-04-03 ENCOUNTER — Encounter: Payer: Self-pay | Admitting: Emergency Medicine

## 2019-04-03 DIAGNOSIS — Z79899 Other long term (current) drug therapy: Secondary | ICD-10-CM | POA: Diagnosis not present

## 2019-04-03 DIAGNOSIS — H6122 Impacted cerumen, left ear: Secondary | ICD-10-CM | POA: Insufficient documentation

## 2019-04-03 DIAGNOSIS — H9202 Otalgia, left ear: Secondary | ICD-10-CM | POA: Diagnosis present

## 2019-04-03 MED ORDER — DOCUSATE SODIUM 50 MG/5ML PO LIQD
50.0000 mg | Freq: Once | ORAL | Status: AC
  Administered 2019-04-03: 50 mg via ORAL
  Filled 2019-04-03: qty 10

## 2019-04-03 MED ORDER — CARBAMIDE PEROXIDE 6.5 % OT SOLN: OTIC | 2 refills | Status: DC

## 2019-04-03 NOTE — ED Provider Notes (Signed)
[facility] Emergency Department Provider Note  ____________________________________________   First MD Initiated Contact with Patient 04/03/19 [phone]     (approximate)  I have reviewed the triage vital signs and the nursing notes.   HISTORY  Chief Complaint Hearing Loss   HPI Lisa Rowland is a 33 y.o. female presents to the ED with complaint of left ear decreased hearing.  Patient states everything sounds muffled to her.  She has some pain on the left side.  She denies any upper respiratory symptoms or fever.  She states this is happened to her before and usually clears up on its own.  She rates her pain as 3/10.     Past Medical History:  Diagnosis Date  . Anemia   . Anxiety   . ASCUS favor benign   . Bipolar 1 disorder (HCC)   . HPV in female   . Hypotension   . Scoliosis     Patient Active Problem List   Diagnosis Date Noted  . Supervision of high-risk pregnancy 05/24/2017  . Headache, migraine 01/23/2014  . 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 Clinical trial participant 11/25/2013  . Opioid dependence (HCC) 10/27/2013  . Myofascial pain 10/10/2013  . Continuous opioid dependence (HCC) 09/22/2013  . Bipolar affective disorder (HCC) 08/19/2013  . Scoliosis 08/19/2013  . Lesion of vulva 08/02/2013  . HPV test positive 06/08/2013  . Cervical pain 04/03/2012    Past Surgical History:  Procedure Laterality Date  . INDUCED ABORTION      Prior to Admission medications   Medication Sig Start Date End Date Taking? Authorizing Provider  ALPRAZolam Prudy Feeler(XANAX) 0.5 MG tablet Take 0.5 mg by mouth 2 (two) times daily as needed for anxiety.    [provider]  carbamide peroxide (DEBROX) 6.5 % OTIC solution Use 5 to 10 drops once a day to the left ear for 3 days 04/03/19   Tommi RumpsSummers, Bartosz Luginbill L, PA-C  misoprostol (CYTOTEC) 200 MCG tablet Place four tablets in the vagina at night. Can repeat in 24 hrs if no bleeding. 05/21/17   Hildred Laserherry, Anika, MD   albuterol (PROVENTIL HFA;VENTOLIN HFA) 108 (90 Base) MCG/ACT inhaler Inhale 2 puffs into the lungs every 6 (six) hours as needed for wheezing or shortness of breath. 10/10/17 04/03/19  Fisher, Roselyn BeringSusan W, PA-C  omeprazole (PRILOSEC) 20 MG capsule Take 1 capsule (20 mg total) by mouth daily. 07/15/18 04/03/19  Jene EveryKinner, Robert, MD    Allergies Amoxicillin, Bee venom, Coconut oil, Mushroom extract complex, and Other  Family History  Problem Relation Age of Onset  . Osteoarthritis Mother   . Asthma Mother   . Hypertension Mother   . Osteoarthritis Father   . Hypertension Father   . Osteoarthritis Maternal Grandmother   . Asthma Maternal Grandmother   . Cancer Maternal Grandmother        breast cancer  . Diabetes Maternal Grandmother   . Heart failure Maternal Grandmother   . Hyperlipidemia Maternal Grandmother   . Hypertension Maternal Grandmother   . Migraines Maternal Grandmother   . Thyroid disease Maternal Grandmother   . Osteoarthritis Paternal Grandmother   . Asthma Paternal Grandmother   . Cancer Paternal Grandmother        stomach  . Diabetes Paternal Grandmother   . Heart failure Paternal Grandmother   . Hyperlipidemia Paternal Grandmother   . Hypertension Paternal Grandmother   . Breast cancer Paternal Aunt   . Hypertension Sister     Social History Social History  Tobacco Use  . Smoking status: Never Smoker  . Smokeless tobacco: Never Used  Substance Use Topics  . Alcohol use: No  . Drug use: No    Review of Systems Constitutional: No fever/chills Eyes: No visual changes. ENT: No sore throat.  Left ear pain. Cardiovascular: Denies chest pain. Respiratory: Denies shortness of breath. Musculoskeletal: Negative for muscle aches. Skin: Negative for rash. Neurological: Negative for headaches. ____________________________________________   PHYSICAL EXAM:  VITAL SIGNS: ED Triage Vitals  Enc Vitals Group     BP 04/03/19 0844 (!) 109/59     Pulse Rate  04/03/19 0844 78     Resp 04/03/19 0844 18     Temp 04/03/19 0844 98.5 F (36.9 C)     Temp Source 04/03/19 0844 Oral     SpO2 04/03/19 0844 100 %     Weight 04/03/19 0842 192 lb (87.1 kg)     Height 04/03/19 0842 5\' 7"  (1.702 m)     Head Circumference --      Peak Flow --      Pain Score 04/03/19 0842 3     Pain Loc --      Pain Edu? --      Excl. in GC? --     Constitutional: Alert and oriented. Well appearing and in no acute distress. Eyes: Conjunctivae are normal.  Head: Atraumatic. Nose: No congestion/rhinnorhea.  Right EAC and TM are clear.  Left EAC is occluded with cerumen.  TM is not visible. Mouth/Throat: Mucous membranes are moist.  Oropharynx non-erythematous. Neck: No stridor.   Cardiovascular: Normal rate, regular rhythm. Grossly normal heart sounds.  Good peripheral circulation. Respiratory: Normal respiratory effort.  No retractions. Lungs CTAB. Musculoskeletal: Moves upper and lower extremities without any difficulty.  Normal gait was noted. Neurologic:  Normal speech and language. No gross focal neurologic deficits are appreciated. No gait instability. Skin:  Skin is warm, dry and intact. No rash noted. Psychiatric: Mood and affect are normal. Speech and behavior are normal.  ____________________________________________   LABS (all labs ordered are listed, but only abnormal results are displayed)  Labs Reviewed - No data to display ____________________________________________   PROCEDURES  Procedure(s) performed (including Critical Care):  Procedures   ____________________________________________   INITIAL IMPRESSION / ASSESSMENT AND PLAN / ED COURSE  As part of my medical decision making, I reviewed the following data within the electronic MEDICAL RECORD NUMBER Notes from prior ED visits and [place] Controlled Substance Database  33 year old female presents to the ED with complaint of decreased hearing in her left ear.  On visual exam there is a cerumen  impaction.  Multiple attempts were made with irrigation and Colace to loosen the cerumen.  Patient was given a prescription for Debrox otic suspension to apply to her ear daily for the next 3 days.  If she is still continued to have problems she can follow-up with her PCP or also see Dr. Jenne CampusMcQueen who is on-call for [facility].  ____________________________________________   FINAL CLINICAL IMPRESSION(S) / ED DIAGNOSES  Final diagnoses:  Impacted cerumen of left ear     ED Discharge Orders         Ordered    carbamide peroxide (DEBROX) 6.5 % OTIC solution     04/03/19 1242           Note:  This document was prepared using Dragon voice recognition software and may include unintentional dictation errors.    Tommi RumpsSummers, Alyaan Budzynski L, PA-C 04/03/19 1419    Scotty CourtStafford,  Doren Custard, MD 04/06/19 [phone]

## 2019-04-03 NOTE — ED Notes (Signed)
Colace applied and irrigation attempted x2. Unable to dislodge any cerumen. Wax does not appear to have softened at all. EDP Summers notified.

## 2019-04-03 NOTE — Discharge Instructions (Addendum)
Follow-up with your primary care provider or Dr. Tami Ribas who is at [facility] if any continued problems.  Begin using the Debrox otic solution to your left ear once a day for the next 3 days.  You may also irrigate your ear with a bulb syringe and warm water.  Do not use Q-tips to clean your ears

## 2019-04-03 NOTE — ED Triage Notes (Signed)
Pt arrived via POV with reports of muffling to right ear since yesterday. Pt states she feels like maybe there is water I in her ear. Pt states she does have some pain on the right side.

## 2019-04-03 NOTE — ED Notes (Signed)

## 2019-06-09 ENCOUNTER — Encounter: Payer: Self-pay | Admitting: Emergency Medicine

## 2019-06-09 ENCOUNTER — Emergency Department
Admission: EM | Admit: 2019-06-09 | Discharge: 2019-06-09 | Disposition: A | Discharge status: Home / Self Care | Payer: Medicaid Other | Attending: Emergency Medicine | Admitting: Emergency Medicine

## 2019-06-09 ENCOUNTER — Emergency Department: Payer: Medicaid Other

## 2019-06-09 ENCOUNTER — Other Ambulatory Visit: Payer: Self-pay

## 2019-06-09 DIAGNOSIS — Z3A01 Less than 8 weeks gestation of pregnancy: Secondary | ICD-10-CM

## 2019-06-09 DIAGNOSIS — Z79899 Other long term (current) drug therapy: Secondary | ICD-10-CM | POA: Diagnosis not present

## 2019-06-09 DIAGNOSIS — O9989 Other specified diseases and conditions complicating pregnancy, childbirth and the puerperium: Secondary | ICD-10-CM | POA: Insufficient documentation

## 2019-06-09 DIAGNOSIS — O26899 Other specified pregnancy related conditions, unspecified trimester: Secondary | ICD-10-CM

## 2019-06-09 DIAGNOSIS — Z3687 Encounter for antenatal screening for uncertain dates: Secondary | ICD-10-CM

## 2019-06-09 DIAGNOSIS — O3680X1 Pregnancy with inconclusive fetal viability, fetus 1: Secondary | ICD-10-CM

## 2019-06-09 DIAGNOSIS — R110 Nausea: Secondary | ICD-10-CM | POA: Diagnosis not present

## 2019-06-09 DIAGNOSIS — R109 Unspecified abdominal pain: Secondary | ICD-10-CM | POA: Insufficient documentation

## 2019-06-09 LAB — COMPREHENSIVE METABOLIC PANEL
ALT: 10 U/L (ref 0–44)
AST: 13 U/L — ABNORMAL LOW (ref 15–41)
Albumin: 3.8 g/dL (ref 3.5–5.0)
Alkaline Phosphatase: 40 U/L (ref 38–126)
Anion gap: 11 (ref 5–15)
BUN: 7 mg/dL (ref 6–20)
CO2: 25 mmol/L (ref 22–32)
Calcium: 8.8 mg/dL — ABNORMAL LOW (ref 8.9–10.3)
Chloride: 99 mmol/L (ref 98–111)
Creatinine, Ser: 0.63 mg/dL (ref 0.44–1.00)
GFR calc Af Amer: >60 mL/min (ref >60)
GFR calc non Af Amer: >60 mL/min (ref >60)
Glucose, Bld: 97 mg/dL (ref 70–99)
Potassium: 4.1 mmol/L (ref 3.5–5.1)
Sodium: 135 mmol/L (ref 135–145)
Total Bilirubin: 0.9 mg/dL (ref 0.3–1.2)
Total Protein: 7.2 g/dL (ref 6.5–8.1)

## 2019-06-09 LAB — URINALYSIS, COMPLETE (UACMP) WITH MICROSCOPIC
Bacteria, UA: NONE SEEN
Bilirubin Urine: NEGATIVE
Glucose, UA: NEGATIVE mg/dL
Hgb urine dipstick: NEGATIVE
Ketones, ur: NEGATIVE mg/dL
Nitrite: NEGATIVE
Protein, ur: 30 mg/dL — AB
Specific Gravity, Urine: 1.018 (ref 1.005–1.030)
pH: 7 (ref 5.0–8.0)

## 2019-06-09 LAB — LIPASE, BLOOD: Lipase: 25 U/L (ref 11–51)

## 2019-06-09 LAB — CBC
HCT: 36 % (ref 36.0–46.0)
Hemoglobin: 11.5 g/dL — ABNORMAL LOW (ref 12.0–15.0)
MCH: 27.6 pg (ref 26.0–34.0)
MCHC: 31.9 g/dL (ref 30.0–36.0)
MCV: 86.3 fL (ref 80.0–100.0)
Platelets: 224 10*3/uL (ref 150–400)
RBC: 4.17 MIL/uL (ref 3.87–5.11)
RDW: 13.4 % (ref 11.5–15.5)
WBC: 6.2 10*3/uL (ref 4.0–10.5)
nRBC: 0 % (ref 0.0–0.2)

## 2019-06-09 LAB — POCT PREGNANCY, URINE: Preg Test, Ur: POSITIVE — AB

## 2019-06-09 LAB — HCG, QUANTITATIVE, PREGNANCY: hCG, Beta Chain, Quant, S: 76183 m[IU]/mL — ABNORMAL HIGH (ref <5)

## 2019-06-09 MED ORDER — METOCLOPRAMIDE HCL 10 MG PO TABS: 10.0000 mg | ORAL_TABLET | Freq: Three times a day (TID) | ORAL | 1 refills | Status: DC

## 2019-06-09 NOTE — Discharge Instructions (Addendum)
Please seek medical attention for any high fevers, chest pain, shortness of breath, change in behavior, persistent vomiting, bloody stool or any other new or concerning symptoms.  

## 2019-06-09 NOTE — ED Provider Notes (Signed)
[facility] Emergency Department Provider Note  ____________________________________________   I have reviewed the triage vital signs and the nursing notes.   HISTORY  Chief Complaint Abdominal Pain   History limited by: Not Limited   HPI Lisa Rowland is a 33 y.o. female who presents to the emergency department today because of concerns for abdominal pain in the setting of recently diagnosed pregnancy.  She states that she was diagnosed with pregnancy last week.  She was seen because of some lower abdominal discomfort.  At the time she was diagnosed with pregnancy she was also told she had urinary tract infection.  She states she is having a hard time keeping antibiotics down secondary to nausea.  She was given Zofran ODT but she states that she does not tolerate it.  Patient has not noticed any abnormal vaginal discharge or bleeding.  She denies any fevers.  Records reviewed. Per medical record review patient has a history of recently diagnosed pregnancy at [facility].   Past Medical History:  Diagnosis Date  . Anemia   . Anxiety   . ASCUS favor benign   . Bipolar 1 disorder (HCC)   . HPV in female   . Hypotension   . Scoliosis     Patient Active Problem List   Diagnosis Date Noted  . Supervision of high-risk pregnancy 05/24/2017  . Headache, migraine 01/23/2014  . 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 Clinical trial participant 11/25/2013  . Opioid dependence (HCC) 10/27/2013  . Myofascial pain 10/10/2013  . Continuous opioid dependence (HCC) 09/22/2013  . Bipolar affective disorder (HCC) 08/19/2013  . Scoliosis 08/19/2013  . Lesion of vulva 08/02/2013  . HPV test positive 06/08/2013  . Cervical pain 04/03/2012    Past Surgical History:  Procedure Laterality Date  . INDUCED ABORTION      Prior to Admission medications   Medication Sig Start Date End Date Taking? Authorizing Provider  ALPRAZolam Prudy Feeler) 0.5 MG tablet Take 0.5 mg by mouth 2  (two) times daily as needed for anxiety.    [provider]  carbamide peroxide (DEBROX) 6.5 % OTIC solution Use 5 to 10 drops once a day to the left ear for 3 days 04/03/19   Tommi Rumps, PA-C  misoprostol (CYTOTEC) 200 MCG tablet Place four tablets in the vagina at night. Can repeat in 24 hrs if no bleeding. 05/21/17   Hildred Laser, MD  albuterol (PROVENTIL HFA;VENTOLIN HFA) 108 (90 Base) MCG/ACT inhaler Inhale 2 puffs into the lungs every 6 (six) hours as needed for wheezing or shortness of breath. 10/10/17 04/03/19  Fisher, Roselyn Bering, PA-C  omeprazole (PRILOSEC) 20 MG capsule Take 1 capsule (20 mg total) by mouth daily. 07/15/18 04/03/19  Jene Every, MD    Allergies Amoxicillin, Bee venom, Coconut oil, Mushroom extract complex, and Other  Family History  Problem Relation Age of Onset  . Osteoarthritis Mother   . Asthma Mother   . Hypertension Mother   . Osteoarthritis Father   . Hypertension Father   . Osteoarthritis Maternal Grandmother   . Asthma Maternal Grandmother   . Cancer Maternal Grandmother        breast cancer  . Diabetes Maternal Grandmother   . Heart failure Maternal Grandmother   . Hyperlipidemia Maternal Grandmother   . Hypertension Maternal Grandmother   . Migraines Maternal Grandmother   . Thyroid disease Maternal Grandmother   . Osteoarthritis Paternal Grandmother   . Asthma Paternal Grandmother   . Cancer Paternal Grandmother  stomach  . Diabetes Paternal Grandmother   . Heart failure Paternal Grandmother   . Hyperlipidemia Paternal Grandmother   . Hypertension Paternal Grandmother   . Breast cancer Paternal Aunt   . Hypertension Sister     Social History Social History   Tobacco Use  . Smoking status: Never Smoker  . Smokeless tobacco: Never Used  Substance Use Topics  . Alcohol use: No  . Drug use: No    Review of Systems Constitutional: No fever/chills Eyes: No visual changes. ENT: No sore throat. Cardiovascular: Denies  chest pain. Respiratory: Denies shortness of breath. Gastrointestinal: Positive for abdominal pain. Positive for nausea.  Genitourinary: Negative for dysuria. Musculoskeletal: Negative for back pain. Skin: Negative for rash. Neurological: Negative for headaches, focal weakness or numbness.  ____________________________________________   PHYSICAL EXAM:  VITAL SIGNS: ED Triage Vitals  Enc Vitals Group     BP 06/09/19 1155 (!) 101/48     Pulse Rate 06/09/19 1155 70     Resp 06/09/19 1155 16     Temp 06/09/19 1155 98.6 F (37 C)     Temp Source 06/09/19 1155 Oral     SpO2 06/09/19 1155 100 %     Weight 06/09/19 1149 185 lb (83.9 kg)     Height 06/09/19 1149 5\' 7"  (1.702 m)     Head Circumference --      Peak Flow --      Pain Score 06/09/19 1149 7   Constitutional: Alert and oriented.  Eyes: Conjunctivae are normal.  ENT      Head: Normocephalic and atraumatic.      Nose: No congestion/rhinnorhea.      Mouth/Throat: Mucous membranes are moist.      Neck: No stridor. Hematological/Lymphatic/Immunilogical: No cervical lymphadenopathy. Cardiovascular: Normal rate, regular rhythm.  No murmurs, rubs, or gallops.  Respiratory: Normal respiratory effort without tachypnea nor retractions. Breath sounds are clear and equal bilaterally. No wheezes/rales/rhonchi. Gastrointestinal: Soft and non tender. No rebound. No guarding.  Genitourinary: Deferred Musculoskeletal: Normal range of motion in all extremities. No lower extremity edema. Neurologic:  Normal speech and language. No gross focal neurologic deficits are appreciated.  Skin:  Skin is warm, dry and intact. No rash noted. Psychiatric: Mood and affect are normal. Speech and behavior are normal. Patient exhibits appropriate insight and judgment.  ____________________________________________    LABS (pertinent positives/negatives)  Upreg positive CMP wnl except ca 8.8, ast 13 CBC wbc 6.2, hgb 11.5, plt 224 Lipase 25 UA  cloudy, protein 30, moderate leukocytes, 21-50 rbc and wbc, 21-50 squamous epithelial ____________________________________________   EKG  None  ____________________________________________    RADIOLOGY  US Live IUP at 6 weeks 2 days  ____________________________________________   PROCEDURES  Procedures  ____________________________________________   INITIAL IMPRESSION / ASSESSMENT AND PLAN / ED COURSE  Pertinent labs & imaging results that were available during my care of the patient were reviewed by me and considered in my medical decision making (see chart for details).   Patient presented to the emergency department today because of concern for abdominal pain in the setting of early pregnancy. The patient had US performed which showed live IUP. UA continued to show evidence of infection. Will give patient prescription for different nausea medicine.   ____________________________________________   FINAL CLINICAL IMPRESSION(S) / ED DIAGNOSES  Final diagnoses:  Less than [redacted] weeks gestation of pregnancy     Note: This dictation was prepared with Dragon dictation. Any transcriptional errors that result from this process are unintentional  Nance Pear, MD 06/09/19 [phone]

## 2019-06-09 NOTE — ED Triage Notes (Signed)
Pt reports went to the hospital Friday for abd pain and nausea. Pt reports she found out she was pregnant at that time and is not sure how far along she is because she was on depot injections for birth control. Pt here today to make sure everything is alright because she continues to have abd pain. Pt reports pain is upper abd. Pt denies vaginal bleeding. Pt describes the pain as intermittent shooting. Pt reports has had 3 miscarriage in the past and is considered high risk.

## 2019-06-09 NOTE — ED Triage Notes (Signed)
Pt reports was put on an abx for a UTI during her visit to [facility] hospital on Friday but has not been able to really take it or keep things down so it may be the medication that is making her hurt.

## 2019-06-15 ENCOUNTER — Other Ambulatory Visit: Payer: Self-pay

## 2019-06-15 ENCOUNTER — Other Ambulatory Visit (HOSPITAL_COMMUNITY)
Admission: RE | Admit: 2019-06-15 | Discharge: 2019-06-15 | Disposition: A | Discharge status: Home / Self Care | Payer: Medicaid Other | Source: Ambulatory Visit | Attending: Obstetrics and Gynecology | Admitting: Obstetrics and Gynecology

## 2019-06-15 ENCOUNTER — Ambulatory Visit (INDEPENDENT_AMBULATORY_CARE_PROVIDER_SITE_OTHER): Payer: Medicaid Other | Admitting: Obstetrics and Gynecology

## 2019-06-15 ENCOUNTER — Encounter: Payer: Self-pay | Admitting: Obstetrics and Gynecology

## 2019-06-15 ENCOUNTER — Inpatient Hospital Stay
Admission: AD | Admit: 2019-06-15 | Discharge: 2019-06-16 | DRG: 832 | Disposition: A | Discharge status: Home / Self Care | Payer: Medicaid Other | Attending: Obstetrics and Gynecology | Admitting: Obstetrics and Gynecology

## 2019-06-15 VITALS — BP 90/50 | Wt 182.0 lb

## 2019-06-15 DIAGNOSIS — Z3A01 Less than 8 weeks gestation of pregnancy: Secondary | ICD-10-CM

## 2019-06-15 DIAGNOSIS — O09891 Supervision of other high risk pregnancies, first trimester: Secondary | ICD-10-CM | POA: Insufficient documentation

## 2019-06-15 DIAGNOSIS — O210 Mild hyperemesis gravidarum: Secondary | ICD-10-CM | POA: Diagnosis present

## 2019-06-15 DIAGNOSIS — R1110 Vomiting, unspecified: Secondary | ICD-10-CM | POA: Diagnosis present

## 2019-06-15 DIAGNOSIS — Z20828 Contact with and (suspected) exposure to other viral communicable diseases: Secondary | ICD-10-CM | POA: Diagnosis present

## 2019-06-15 DIAGNOSIS — O09211 Supervision of pregnancy with history of pre-term labor, first trimester: Secondary | ICD-10-CM | POA: Diagnosis not present

## 2019-06-15 DIAGNOSIS — O219 Vomiting of pregnancy, unspecified: Secondary | ICD-10-CM | POA: Diagnosis not present

## 2019-06-15 DIAGNOSIS — Z23 Encounter for immunization: Secondary | ICD-10-CM

## 2019-06-15 DIAGNOSIS — O2341 Unspecified infection of urinary tract in pregnancy, first trimester: Secondary | ICD-10-CM | POA: Diagnosis present

## 2019-06-15 LAB — COMPREHENSIVE METABOLIC PANEL
ALT: 12 U/L (ref 0–44)
AST: 16 U/L (ref 15–41)
Albumin: 4.4 g/dL (ref 3.5–5.0)
Alkaline Phosphatase: 43 U/L (ref 38–126)
Anion gap: 10 (ref 5–15)
BUN: 10 mg/dL (ref 6–20)
CO2: 24 mmol/L (ref 22–32)
Calcium: 9.5 mg/dL (ref 8.9–10.3)
Chloride: 101 mmol/L (ref 98–111)
Creatinine, Ser: 0.59 mg/dL (ref 0.44–1.00)
GFR calc Af Amer: >60 mL/min (ref >60)
GFR calc non Af Amer: >60 mL/min (ref >60)
Glucose, Bld: 92 mg/dL (ref 70–99)
Potassium: 4.1 mmol/L (ref 3.5–5.1)
Sodium: 135 mmol/L (ref 135–145)
Total Bilirubin: 1.1 mg/dL (ref 0.3–1.2)
Total Protein: 8.2 g/dL — ABNORMAL HIGH (ref 6.5–8.1)

## 2019-06-15 LAB — POCT URINALYSIS DIPSTICK
Bilirubin, UA: NEGATIVE
Blood, UA: NEGATIVE
Glucose, UA: NEGATIVE
Nitrite, UA: NEGATIVE
Protein, UA: POSITIVE — AB
Spec Grav, UA: <=1.005 — AB (ref 1.010–1.025)
Urobilinogen, UA: 2 E.U./dL — AB
pH, UA: >=9 — AB (ref 5.0–8.0)

## 2019-06-15 LAB — PHOSPHORUS: Phosphorus: 3.5 mg/dL (ref 2.5–4.6)

## 2019-06-15 LAB — MAGNESIUM: Magnesium: 2 mg/dL (ref 1.7–2.4)

## 2019-06-15 MED ORDER — ONDANSETRON HCL 4 MG PO TABS: 4.0000 mg | ORAL_TABLET | Freq: Four times a day (QID) | ORAL | Status: DC | PRN

## 2019-06-15 MED ORDER — ONDANSETRON HCL 4 MG/2ML IJ SOLN
4.0000 mg | Freq: Four times a day (QID) | INTRAMUSCULAR | Status: DC | PRN
  Administered 2019-06-15 (×2): 4 mg via INTRAVENOUS
  Filled 2019-06-15 (×2): qty 2

## 2019-06-15 MED ORDER — THIAMINE HCL 100 MG/ML IJ SOLN
Freq: Once | INTRAVENOUS | Status: AC
  Administered 2019-06-15: 14:00:00 via INTRAVENOUS
  Filled 2019-06-15: qty 1000

## 2019-06-15 MED ORDER — DOCUSATE SODIUM 100 MG PO CAPS
100.0000 mg | ORAL_CAPSULE | Freq: Two times a day (BID) | ORAL | Status: DC
  Administered 2019-06-15: 100 mg via ORAL
  Filled 2019-06-15: qty 1

## 2019-06-15 MED ORDER — CEPHALEXIN 500 MG PO CAPS
500.0000 mg | ORAL_CAPSULE | Freq: Two times a day (BID) | ORAL | Status: DC
  Administered 2019-06-15 – 2019-06-16 (×2): 500 mg via ORAL
  Filled 2019-06-15 (×3): qty 1

## 2019-06-15 MED ORDER — LACTATED RINGERS IV SOLN
INTRAVENOUS | Status: DC
  Administered 2019-06-15 – 2019-06-16 (×2): via INTRAVENOUS

## 2019-06-15 MED ORDER — INFLUENZA VAC SPLIT QUAD 0.5 ML IM SUSY
0.5000 mL | PREFILLED_SYRINGE | INTRAMUSCULAR | Status: AC
  Administered 2019-06-16: 11:00:00 0.5 mL via INTRAMUSCULAR
  Filled 2019-06-15: qty 0.5

## 2019-06-15 MED ORDER — PROMETHAZINE HCL 25 MG PO TABS
25.0000 mg | ORAL_TABLET | Freq: Four times a day (QID) | ORAL | Status: DC | PRN
  Filled 2019-06-15: qty 1

## 2019-06-15 NOTE — Progress Notes (Signed)
NOB Confirmed at hospital C/o Severe nausea and vomiting, constipation, and stomach and back are hurting all the time  Unable to keep water and nausea medication

## 2019-06-15 NOTE — Progress Notes (Signed)
Pt feeling better this evening after IV hydration. Tolerated fries and half chicken sandwich and has not felt nauseous for a couple of hours.

## 2019-06-15 NOTE — Progress Notes (Signed)
07/28/2019   Chief Complaint: Missed period  Transfer of Care Patient: no  History of Present Illness: Ms. Armentrout is a 33 y.o. [id number] [redacted]w[redacted]d based on No LMP recorded (lmp unknown). Patient is pregnant. with an Estimated Date of Delivery: 01/31/20, with the above CC.   Her periods were: unknown, she conceived on Depo Provera She was using Depo-Provera when she conceived.  She has Positive signs or symptoms of nausea/vomiting of pregnancy. She has Negative signs or symptoms of miscarriage or preterm labor She was not taking different medications around the time she conceived/early pregnancy. Since her LMP, she has not used alcohol Since her LMP, she has not used tobacco products Since her LMP, she has not used illegal drugs.    Current or past history of domestic violence. no  Infection History:  1. Since her LMP, she has not had a viral illness.  2. She admits to close contact with children on a regular basis     3. She has a history of chicken pox. She denies vaccination for chicken pox in the past. 4. Patient or partner has history of genital herpes  yes 5. History of STI (GC, CT, HPV, syphilis, HIV)  yes Trichomonas and gonorrhea 6.  She does not live with someone with TB or TB exposed. 7. History of recent travel :  no 8. She identifies Negative Zika risk factors for her and her partner 59. There are not cats in the home in the home.  She understands that while pregnant she should not change cat litter.   Genetic Screening Questions: (Includes patient, baby's father, or anyone in either family)   1. Patient's age >/= [address] [facility]  no 2. Thalassemia ([place], [place], Mediterranean, or Asian background): MCV<80  no 3. Neural tube defect (meningomyelocele, spina bifida, anencephaly)  no 4. Congenital heart defect  yes  Grandmother's Sister has a hole in her heart 5. Down syndrome  Yes  Uncle 6. Tay-Sachs (Jewish, [place]  no 7. Canavan's Disease  no 8. Sickle cell disease  or trait (African)  no  9. Hemophilia or other blood disorders  no  10. Muscular dystrophy  no  11. Cystic fibrosis  no  12. Huntington's Chorea  no  13. Mental retardation/autism  One of her sons may be autistic 66. Other inherited genetic or chromosomal disorder  no 15. Maternal metabolic disorder (DM, PKU, etc)  no 16. Patient or FOB with a child with a birth defect not listed above no  16a. 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 Recurrent pregnancy loss, or stillbirth  no  18. Any medications since LMP other than prenatal vitamins (include vitamins, supplements, OTC meds, drugs, alcohol)  no 19. Any other genetic/environmental exposure to discuss  no  ROS:  Review of Systems  Constitutional: Positive for weight loss. Negative for chills, fever and malaise/fatigue.       + change in appetite   HENT: Negative for congestion, hearing loss and sinus pain.   Eyes: Negative for blurred vision and double vision.  Respiratory: Negative for cough, sputum production, shortness of breath and wheezing.   Cardiovascular: Negative for chest pain, palpitations, orthopnea and leg swelling.  Gastrointestinal: Positive for nausea and vomiting. Negative for abdominal pain, constipation and diarrhea.  Genitourinary: Negative for dysuria, flank pain, frequency, hematuria and urgency.       + difficulty holding urine  Musculoskeletal: Negative for back pain, falls and joint pain.  Skin: Negative for itching and rash.  Neurological: Negative for dizziness and headaches.  Psychiatric/Behavioral: Negative for depression, substance abuse and suicidal ideas. The patient is not nervous/anxious.     OBGYN History: As per HPI. OB History  Gravida Para Term Preterm AB Living  6 3 3  0 2 3  SAB TAB Ectopic Multiple Live Births  0 2 0 0 3    # Outcome Date GA Lbr Len/2nd Weight Sex Delivery Anes PTL Lv  6 Current           5 Term 03/14/14    M  Vag-Spont   LIV  4 Term 06/28/09    M Vag-Spont   LIV  3 Term 02/24/04    F Vag-Spont   LIV  2 TAB         FD  1 TAB         FD    Obstetric Comments  H/o cervical incompetency in last pregnancy, (however never required cerclage), had to take 17-P during pregnancy    Any issues with any prior pregnancies: Took 17-P with her prior pregnancies.  Any prior children are healthy, doing well, without any problems or issues: yes Last pap smear 2016 Ascus HPV + History of STIs: Yes   Past Medical History: Past Medical History:  Diagnosis Date  . Anemia   . Anxiety   . ASCUS favor benign   . Bipolar 1 disorder (HCC)   . HPV in female   . Hypotension   . Scoliosis     Past Surgical History: Past Surgical History:  Procedure Laterality Date  . INDUCED ABORTION      Family History:  Family History  Problem Relation Age of Onset  . Osteoarthritis Mother   . Asthma Mother   . Hypertension Mother   . Osteoarthritis Father   . Hypertension Father   . Osteoarthritis Maternal Grandmother   . Asthma Maternal Grandmother   . Cancer Maternal Grandmother        breast cancer  . Diabetes Maternal Grandmother   . Heart failure Maternal Grandmother   . Hyperlipidemia Maternal Grandmother   . Hypertension Maternal Grandmother   . Migraines Maternal Grandmother   . Thyroid disease Maternal Grandmother   . Osteoarthritis Paternal Grandmother   . Asthma Paternal Grandmother   . Cancer Paternal Grandmother        stomach  . Diabetes Paternal Grandmother   . Heart failure Paternal Grandmother   . Hyperlipidemia Paternal Grandmother   . Hypertension Paternal Grandmother   . Breast cancer Paternal Aunt   . Hypertension Sister    She denies any female cancers, bleeding or blood clotting disorders.   Social History:  Social History   Socioeconomic History  . Marital status: Single    Spouse name: Not on file  . Number of children: Not on file  . Years of education: Not on file  .  Highest education level: Not on file  Occupational History  . Not on file  Social Needs  . Financial resource strain: Not on file  . Food insecurity    Worry: Not on file    Inability: Not on file  . Transportation needs    Medical: Not on file    Non-medical: Not on file  Tobacco Use  . Smoking status: Never Smoker  . Smokeless tobacco: Never Used  Substance and Sexual Activity  . Alcohol use: No  . Drug use: No  . Sexual activity: Yes  Birth control/protection: Other-see comments  Lifestyle  . Physical activity    Days per week: Not on file    Minutes per session: Not on file  . Stress: Not on file  Relationships  . Social Musicianconnections    Talks on phone: Not on file    Gets together: Not on file    Attends religious service: Not on file    Active member of club or organization: Not on file    Attends meetings of clubs or organizations: Not on file    Relationship status: Not on file  . Intimate partner violence    Fear of current or ex partner: Not on file    Emotionally abused: Not on file    Physically abused: Not on file    Forced sexual activity: Not on file  Other Topics Concern  . Not on file  Social History Narrative  . Not on file    Allergy: Allergies  Allergen Reactions  . Amoxicillin Hives  . Bee Venom Swelling and Shortness Of Breath  . Coconut Oil Shortness Of Breath and Swelling  . Mushroom Extract Complex Swelling  . Other Anaphylaxis and Swelling    Coconut Coconut    Current Outpatient Medications:  Current Outpatient Medications:  .  ALPRAZolam (XANAX) 0.25 MG tablet, Take 0.25 mg by mouth 2 (two) times daily as needed for anxiety., Disp: , Rfl:  .  cephALEXin (KEFLEX) 500 MG capsule, Take 1 capsule (500 mg total) by mouth 2 (two) times daily., Disp: 14 capsule, Rfl: 0 .  clindamycin (CLEOCIN) 300 MG capsule, TAKE 1 CAPSULE BY MOUTH EVERY 6 HOURS FOR 7 DAYS, Disp: , Rfl:  .  EPINEPHrine 0.3 mg/0.3 mL IJ SOAJ injection, Inject into the  muscle., Disp: , Rfl:  .  metoCLOPramide (REGLAN) 10 MG tablet, Take 1 tablet (10 mg total) by mouth every 6 (six) hours as needed., Disp: 30 tablet, Rfl: 0 .  metroNIDAZOLE (METROGEL) 1 % gel, Apply topically., Disp: , Rfl:  .  MICONAZOLE NITRATE VAGINAL (MONISTAT 3) 4 % CREA, Place 1 application vaginally at bedtime., Disp: 25 g, Rfl: 0 .  nitrofurantoin (MACRODANTIN) 100 MG capsule, Take 1 capsule (100 mg total) by mouth 2 (two) times daily., Disp: 14 capsule, Rfl: 0 .  ondansetron (ZOFRAN) 4 MG tablet, Take 1 tablet (4 mg total) by mouth every 8 (eight) hours as needed for nausea or vomiting., Disp: 20 tablet, Rfl: 0 .  oxybutynin (DITROPAN-XL) 5 MG 24 hr tablet, Take 5 mg by mouth daily., Disp: , Rfl:    Physical Exam: Physical Exam  Constitutional: She is oriented to person, place, and time and well-developed, well-nourished, and in no distress.  HENT:  Head: Normocephalic and atraumatic.  Eyes: Pupils are equal, round, and reactive to light.  Neck: Normal range of motion. Neck supple. No thyromegaly present.  Cardiovascular: Normal rate and regular rhythm.  Pulmonary/Chest: Effort normal.  Abdominal: Soft. Bowel sounds are normal. She exhibits no distension. There is no abdominal tenderness. There is no rebound and no guarding.  Genitourinary:    Genitourinary Comments: External: Normal appearing vulva. No lesions noted.  Speculum examination: Normal appearing cervix. No blood in the vaginal vault. no discharge.   Bimanual examination: Uterus midline, non-tender, normal in size, shape and contour.  No CMT. No adnexal masses. No adnexal tenderness. Pelvis not fixed.      Musculoskeletal: Normal range of motion.  Neurological: She is alert and oriented to person, place, and time.  Skin: Skin  is warm and dry.  Psychiatric: Affect and judgment normal.  Nursing note and vitals reviewed.    Assessment: Ms. Mabry is a 33 y.o. [id number] [redacted]w[redacted]d based on No LMP recorded (lmp unknown).  Patient is pregnant. with an Estimated Date of Delivery: 01/31/20,  for prenatal care.  Plan:  1) Avoid alcoholic beverages. 2) Patient encouraged not to smoke.  3) Discontinue the use of all non-medicinal drugs and chemicals.  4) Take prenatal vitamins daily.  5) Seatbelt use advised 6) Nutrition, food safety (fish, cheese advisories, and high nitrite foods) and exercise discussed. 7) Hospital and practice style delivering at [facility] discussed  8) Patient is asked about travel to areas at risk for the [place] virus, and counseled to avoid travel and exposure to mosquitoes or sexual partners who may have themselves been exposed to the virus. Testing is discussed, and will be ordered as appropriate.  9) Childbirth classes at [facility] advised 10) Genetic Screening, such as with 1st Trimester Screening, cell free fetal DNA, AFP testing, and Ultrasound, as well as with amniocentesis and CVS as appropriate, is discussed with patient. She plans to have genetic testing this pregnancy.  Sent to L&D for admission for hyperemesis.  Consider 17-P this pregnancy and cervical length monitoring at 16 weeks.   Problem list reviewed and updated.  I discussed the assessment and treatment plan with the patient. The patient was provided an opportunity to ask questions and all were answered. The patient agreed with the plan and demonstrated an understanding of the instructions.  Adrian Prows MD [facility] 07/28/2019 10:15 PM

## 2019-06-15 NOTE — H&P (Signed)
Obstetric H&P   Chief Complaint: Hyperemesis  Prenatal Care Provider: [facility]   History of Present Illness: 33 y.o. [id number] [redacted]w[redacted]d by 01/31/2020, by Ultrasound presenting as a direct admission to the hospital for IV hydration and laboratory monitoring due to hyperemesis. Also suspected to have a urinary tract infection; recently completed treatment of prescribed antibiotics (Macrobid) after an ED visit on 06/03/2019 at [facility].  She presented to clinic this morning for a new obstetrics visit complaining of nausea and vomiting with inability to tolerate food and fluids and abdominal pain. No relief with PO Zofran and the dissolving tablets made her feel sick. UA was positive for 4+ leukocytes and urine culture was sent from clinic. Upon exam this afternoon, patient notes improvement after IV hydration, has adequate urine output, is tolerating PO ([employer] fries, lemonade, and half a sandwich brought by family member), and does not have currently have abdominal pain.  Pregravid weight 88 kg Total Weight Gain -5.443 kg  Pregnancy #7 Problems (from 06/15/19 to present)    Problem Noted Resolved   Hyperemesis affecting pregnancy, antepartum 06/15/2019 by Rexene Agent, CNM No       Review of Systems: 10 point review of systems negative unless otherwise noted in HPI  Past Medical History: Past Medical History:  Diagnosis Date  . Anemia   . Anxiety   . ASCUS favor benign   . Bipolar 1 disorder ([place])   . HPV in female   . Hypotension   . Scoliosis     Past Surgical History: Past Surgical History:  Procedure Laterality Date  . INDUCED ABORTION      Past Obstetric History:  Past Gynecologic History:  Family History: Family History  Problem Relation Age of Onset  . Osteoarthritis Mother   . Asthma Mother   . Hypertension Mother   . Osteoarthritis Father   . Hypertension Father   . Osteoarthritis Maternal Grandmother   . Asthma Maternal Grandmother   . Cancer  Maternal Grandmother        breast cancer  . Diabetes Maternal Grandmother   . Heart failure Maternal Grandmother   . Hyperlipidemia Maternal Grandmother   . Hypertension Maternal Grandmother   . Migraines Maternal Grandmother   . Thyroid disease Maternal Grandmother   . Osteoarthritis Paternal Grandmother   . Asthma Paternal Grandmother   . Cancer Paternal Grandmother        stomach  . Diabetes Paternal Grandmother   . Heart failure Paternal Grandmother   . Hyperlipidemia Paternal Grandmother   . Hypertension Paternal Grandmother   . Breast cancer Paternal Aunt   . Hypertension Sister     Social History: Social History   Socioeconomic History  . Marital status: Single    Spouse name: Not on file  . Number of children: Not on file  . Years of education: Not on file  . Highest education level: Not on file  Occupational History  . Not on file  Social Needs  . Financial resource strain: Not on file  . Food insecurity    Worry: Not on file    Inability: Not on file  . Transportation needs    Medical: Not on file    Non-medical: Not on file  Tobacco Use  . Smoking status: Never Smoker  . Smokeless tobacco: Never Used  Substance and Sexual Activity  . Alcohol use: No  . Drug use: No  . Sexual activity: Yes    Birth control/protection: Other-see comments  Lifestyle  .  Physical activity    Days per week: Not on file    Minutes per session: Not on file  . Stress: Not on file  Relationships  . Social Musician on phone: Not on file    Gets together: Not on file    Attends religious service: Not on file    Active member of club or organization: Not on file    Attends meetings of clubs or organizations: Not on file    Relationship status: Not on file  . Intimate partner violence    Fear of current or ex partner: Not on file    Emotionally abused: Not on file    Physically abused: Not on file    Forced sexual activity: Not on file  Other Topics Concern   . Not on file  Social History Narrative  . Not on file    Medications: Prior to Admission medications   Medication Sig Start Date End Date Taking? Authorizing Provider  ondansetron (ZOFRAN-ODT) 4 MG disintegrating tablet Take 4 mg by mouth every 8 (eight) hours as needed for nausea. 06/03/19  Yes [provider]  ALPRAZolam (XANAX) 0.25 MG tablet Take 0.25 mg by mouth 2 (two) times daily as needed for anxiety. 12/16/18   [provider]  carbamide peroxide (DEBROX) 6.5 % OTIC solution Use 5 to 10 drops once a day to the left ear for 3 days Patient not taking: Reported on 06/09/2019 04/03/19   Tommi Rumps, PA-C  metoCLOPramide (REGLAN) 10 MG tablet Take 1 tablet (10 mg total) by mouth 3 (three) times daily with meals. Patient not taking: Reported on 06/15/2019 06/09/19 06/08/20  Phineas Semen, MD  oxybutynin (DITROPAN-XL) 5 MG 24 hr tablet Take 5 mg by mouth daily. 04/07/19   [provider]  albuterol (PROVENTIL HFA;VENTOLIN HFA) 108 (90 Base) MCG/ACT inhaler Inhale 2 puffs into the lungs every 6 (six) hours as needed for wheezing or shortness of breath. 10/10/17 04/03/19  Fisher, Roselyn Bering, PA-C  omeprazole (PRILOSEC) 20 MG capsule Take 1 capsule (20 mg total) by mouth daily. 07/15/18 04/03/19  Jene Every, MD    Allergies: Allergies  Allergen Reactions  . Amoxicillin Hives  . Bee Venom Swelling and Shortness Of Breath  . Coconut Oil Shortness Of Breath and Swelling  . Mushroom Extract Complex Swelling  . Other Anaphylaxis and Swelling    Coconut Coconut    Physical Exam: Vitals: Blood pressure 110/65, pulse 70, temperature 98 F (36.7 C), temperature source Oral, resp. rate 18, height 5\' 7"  (1.702 m), weight 85.2 kg, SpO2 98 %.  General: NAD, cheerful and well-appearing HEENT: normocephalic, anicteric Pulmonary: No increased work of breathing, CTAB Cardiovascular: RRR, distal pulses 2+ Abdomen: Non-tender Extremities: no edema, erythema, or  tenderness Neurologic: Grossly intact Psychiatric: mood appropriate, affect full  Labs: Results for orders placed or performed during the hospital encounter of 06/15/19 (from the past 24 hour(s))  Comprehensive metabolic panel     Status: Abnormal   Collection Time: 06/15/19 12:42 PM  Result Value Ref Range   Sodium 135 135 - 145 mmol/L   Potassium 4.1 3.5 - 5.1 mmol/L   Chloride 101 98 - 111 mmol/L   CO2 24 22 - 32 mmol/L   Glucose, Bld 92 70 - 99 mg/dL   BUN 10 6 - 20 mg/dL   Creatinine, Ser 06/17/19 0.44 - 1.00 mg/dL   Calcium 9.5 8.9 - 7.25 mg/dL   Total Protein 8.2 (H) 6.5 - 8.1 g/dL  Albumin 4.4 3.5 - 5.0 g/dL   AST 16 15 - 41 U/L   ALT 12 0 - 44 U/L   Alkaline Phosphatase 43 38 - 126 U/L   Total Bilirubin 1.1 0.3 - 1.2 mg/dL   GFR calc non Af Amer >60 >60 mL/min   GFR calc Af Amer >60 >60 mL/min   Anion gap 10 5 - 15  Magnesium     Status: None   Collection Time: 06/15/19 12:42 PM  Result Value Ref Range   Magnesium 2.0 1.7 - 2.4 mg/dL  Phosphorus     Status: None   Collection Time: 06/15/19 12:42 PM  Result Value Ref Range   Phosphorus 3.5 2.5 - 4.6 mg/dL    Assessment: 33 y.o. [id number] by 01/31/2020, by Ultrasound with hyperemesis and probable UTI.  Plan: 1) Continue IV hydration with banana bag. Monitor intake and output.  2) Antiemetics as needed.  3) Advance to regular diet.  4) Awaiting urine culture results to determine appropriate therapy for UTI.  5) Disposition - continue observation overnight. Change oral antiemetic therapy from Zofran ODT as patient does not tolerate.  Marcelyn BruinsJacelyn Roderick Calo, CNM 06/15/2019  5:05 PM

## 2019-06-16 LAB — RPR+RH+ABO+RUB AB+AB SCR+CB...
Antibody Screen: NEGATIVE
HIV Screen 4th Generation wRfx: NONREACTIVE
Hematocrit: 39.3 % (ref 34.0–46.6)
Hemoglobin: 12.7 g/dL (ref 11.1–15.9)
Hepatitis B Surface Ag: NEGATIVE
MCH: 28.1 pg (ref 26.6–33.0)
MCHC: 32.3 g/dL (ref 31.5–35.7)
MCV: 87 fL (ref 79–97)
Platelets: 253 10*3/uL (ref 150–450)
RBC: 4.52 x10E6/uL (ref 3.77–5.28)
RDW: 12.7 % (ref 11.7–15.4)
RPR Ser Ql: NONREACTIVE
Rh Factor: POSITIVE
Rubella Antibodies, IGG: 12.1 index (ref >0.99)
Varicella zoster IgG: >4000 index (ref >165)
WBC: 7.2 10*3/uL (ref 3.4–10.8)

## 2019-06-16 LAB — CBC
HCT: 33 % — ABNORMAL LOW (ref 36.0–46.0)
Hemoglobin: 10.7 g/dL — ABNORMAL LOW (ref 12.0–15.0)
MCH: 27.9 pg (ref 26.0–34.0)
MCHC: 32.4 g/dL (ref 30.0–36.0)
MCV: 86.2 fL (ref 80.0–100.0)
Platelets: 221 10*3/uL (ref 150–400)
RBC: 3.83 MIL/uL — ABNORMAL LOW (ref 3.87–5.11)
RDW: 13.2 % (ref 11.5–15.5)
WBC: 8.1 10*3/uL (ref 4.0–10.5)
nRBC: 0 % (ref 0.0–0.2)

## 2019-06-16 LAB — SARS CORONAVIRUS 2 (TAT 6-24 HRS): SARS Coronavirus 2: NEGATIVE

## 2019-06-16 MED ORDER — PROMETHAZINE HCL 25 MG PO TABS: 25.0000 mg | ORAL_TABLET | Freq: Three times a day (TID) | ORAL | 2 refills | Status: DC | PRN

## 2019-06-16 MED ORDER — CEPHALEXIN 500 MG PO CAPS: 500.0000 mg | ORAL_CAPSULE | Freq: Two times a day (BID) | ORAL | 0 refills | Status: DC

## 2019-06-16 NOTE — Discharge Summary (Signed)
Physician Final Progress Note  Patient ID: Lisa Rowland MRN: [id number] DOB/AGE: 1986-08-28 33 y.o.  Admit date: 06/15/2019 Admitting provider: Homero Fellers, MD Discharge date: 06/16/2019   Admission Diagnoses: hyperemesis affecting pregnancy  Discharge Diagnoses:  Active Problems:   Hyperemesis affecting pregnancy, antepartum   Hyperemesis IUP at 7 weeks  History of Present Illness: The patient is a 33 y.o. female [id number] at [redacted]w[redacted]d by 6 week u/s who presented for direct admission to the hospital for IV hydration and laboratory monitoring due to hyperemesis. Also suspected to have a urinary tract infection; recently completed treatment of prescribed antibiotics (Macrobid) after an ED visit on 06/03/2019 at [facility].  She presented to clinic yesterday morning for a new obstetrics visit complaining of nausea and vomiting with inability to tolerate food and fluids and abdominal pain. No relief with PO Zofran and the dissolving tablets made her feel sick. UA was positive for 4+ leukocytes and urine culture was sent from clinic.   The patient notes improvement after receiving IV fluids. She was tolerating PO intake last night and this morning she is eating breakfast of bacon and eggs. She plans to stay hydrated with water as she has not been a water drinker. She stopped drinking sodas.   She is discharged to home in stable/improved condition with Rx for phenergan in case she cannot tolerate zofran, and Rx for Keflex for presumed UTI- culture is pending.   Past Medical History:  Diagnosis Date  . Anemia   . Anxiety   . ASCUS favor benign   . Bipolar 1 disorder ([place])   . HPV in female   . Hypotension   . Scoliosis     Past Surgical History:  Procedure Laterality Date  . INDUCED ABORTION      No current facility-administered medications on file prior to encounter.    Current Outpatient Medications on File Prior to Encounter  Medication Sig Dispense Refill  .  ondansetron (ZOFRAN-ODT) 4 MG disintegrating tablet Take 4 mg by mouth every 8 (eight) hours as needed for nausea.    . ALPRAZolam (XANAX) 0.25 MG tablet Take 0.25 mg by mouth 2 (two) times daily as needed for anxiety.    . carbamide peroxide (DEBROX) 6.5 % OTIC solution Use 5 to 10 drops once a day to the left ear for 3 days (Patient not taking: Reported on 06/09/2019) 15 mL 2  . metoCLOPramide (REGLAN) 10 MG tablet Take 1 tablet (10 mg total) by mouth 3 (three) times daily with meals. (Patient not taking: Reported on 06/15/2019) 60 tablet 1  . oxybutynin (DITROPAN-XL) 5 MG 24 hr tablet Take 5 mg by mouth daily.    . [DISCONTINUED] albuterol (PROVENTIL HFA;VENTOLIN HFA) 108 (90 Base) MCG/ACT inhaler Inhale 2 puffs into the lungs every 6 (six) hours as needed for wheezing or shortness of breath. 1 Inhaler 2  . [DISCONTINUED] omeprazole (PRILOSEC) 20 MG capsule Take 1 capsule (20 mg total) by mouth daily. 30 capsule 1    Allergies  Allergen Reactions  . Amoxicillin Hives  . Bee Venom Swelling and Shortness Of Breath  . Coconut Oil Shortness Of Breath and Swelling  . Mushroom Extract Complex Swelling  . Other Anaphylaxis and Swelling    Coconut Coconut    Social History   Socioeconomic History  . Marital status: Single    Spouse name: Not on file  . Number of children: Not on file  . Years of education: Not on file  . Highest education level:  Not on file  Occupational History  . Not on file  Social Needs  . Financial resource strain: Not on file  . Food insecurity    Worry: Not on file    Inability: Not on file  . Transportation needs    Medical: Not on file    Non-medical: Not on file  Tobacco Use  . Smoking status: Never Smoker  . Smokeless tobacco: Never Used  Substance and Sexual Activity  . Alcohol use: No  . Drug use: No  . Sexual activity: Yes    Birth control/protection: Other-see comments  Lifestyle  . Physical activity    Days per week: Not on file    Minutes  per session: Not on file  . Stress: Not on file  Relationships  . Social Musician on phone: Not on file    Gets together: Not on file    Attends religious service: Not on file    Active member of club or organization: Not on file    Attends meetings of clubs or organizations: Not on file    Relationship status: Not on file  . Intimate partner violence    Fear of current or ex partner: Not on file    Emotionally abused: Not on file    Physically abused: Not on file    Forced sexual activity: Not on file  Other Topics Concern  . Not on file  Social History Narrative  . Not on file    Family History  Problem Relation Age of Onset  . Osteoarthritis Mother   . Asthma Mother   . Hypertension Mother   . Osteoarthritis Father   . Hypertension Father   . Osteoarthritis Maternal Grandmother   . Asthma Maternal Grandmother   . Cancer Maternal Grandmother        breast cancer  . Diabetes Maternal Grandmother   . Heart failure Maternal Grandmother   . Hyperlipidemia Maternal Grandmother   . Hypertension Maternal Grandmother   . Migraines Maternal Grandmother   . Thyroid disease Maternal Grandmother   . Osteoarthritis Paternal Grandmother   . Asthma Paternal Grandmother   . Cancer Paternal Grandmother        stomach  . Diabetes Paternal Grandmother   . Heart failure Paternal Grandmother   . Hyperlipidemia Paternal Grandmother   . Hypertension Paternal Grandmother   . Breast cancer Paternal Aunt   . Hypertension Sister      Review of Systems  Constitutional: Negative.   HENT: Negative.   Eyes: Negative.   Respiratory: Negative.   Cardiovascular: Negative.   Gastrointestinal: Negative.   Genitourinary: Negative.   Musculoskeletal: Negative.   Skin: Negative.   Neurological: Negative.   Endo/Heme/Allergies: Negative.   Psychiatric/Behavioral: Negative.      Physical Exam: BP (!) 96/50 (BP Location: Left Arm) Comment: nurse Skyler Stone notified  Pulse 61    Temp 99 F (37.2 C) (Oral)   Resp 18   Ht  (1.702 m)   Wt 82.8 kg   LMP  (LMP Unknown)   SpO2 100% Comment: Room Air  BMI 28.59 kg/m   Constitutional: Well nourished, well developed female in no acute distress.  HEENT: normal Skin: Warm and dry.  Cardiovascular: Regular rate and rhythm.   Extremity: no edema  Respiratory: Clear to auscultation bilateral. Normal respiratory effort Abdomen: FHT present Back: no CVAT Neuro: DTRs 2+, Cranial nerves grossly intact Psych: Alert and Oriented x3. No memory deficits. Normal mood and affect.  Consults: None  Significant Findings/ Diagnostic Studies: labs:   Results for ADAHLIA, STEMBRIDGE (MRN [id number]) as of 06/16/2019 09:05  Ref. Range 06/15/2019 10:58 06/15/2019 12:42 06/15/2019 17:09 06/16/2019 05:13  COMPREHENSIVE METABOLIC PANEL Unknown  Rpt (A)    Sodium Latest Ref Range: 135 - 145 mmol/L  135    Potassium Latest Ref Range: 3.5 - 5.1 mmol/L  4.1    Chloride Latest Ref Range: 98 - 111 mmol/L  101    CO2 Latest Ref Range: 22 - 32 mmol/L  24    Glucose Latest Ref Range: 70 - 99 mg/dL  92    BUN Latest Ref Range: 6 - 20 mg/dL  10    Creatinine Latest Ref Range: 0.44 - 1.00 mg/dL  6.54    Calcium Latest Ref Range: 8.9 - 10.3 mg/dL  9.5    Anion gap Latest Ref Range: 5 - 15   10    Phosphorus Latest Ref Range: 2.5 - 4.6 mg/dL  3.5    Magnesium Latest Ref Range: 1.7 - 2.4 mg/dL  2.0    Alkaline Phosphatase Latest Ref Range: 38 - 126 U/L  43    Albumin Latest Ref Range: 3.5 - 5.0 g/dL  4.4    AST Latest Ref Range: 15 - 41 U/L  16    ALT Latest Ref Range: 0 - 44 U/L  12    Total Protein Latest Ref Range: 6.5 - 8.1 g/dL  8.2 (H)    Total Bilirubin Latest Ref Range: 0.3 - 1.2 mg/dL  1.1    GFR, Est Non African American Latest Ref Range: >60 mL/min  >60    GFR, Est African American Latest Ref Range: >60 mL/min  >60    WBC Latest Ref Range: 4.0 - 10.5 K/uL    8.1  RBC Latest Ref Range: 3.87 - 5.11 MIL/uL    3.83 (L)  Hemoglobin  Latest Ref Range: 12.0 - 15.0 g/dL    65.0 (L)  HCT Latest Ref Range: 36.0 - 46.0 %    33.0 (L)  MCV Latest Ref Range: 80.0 - 100.0 fL    86.2  MCH Latest Ref Range: 26.0 - 34.0 pg    27.9  MCHC Latest Ref Range: 30.0 - 36.0 g/dL    35.4  RDW Latest Ref Range: 11.5 - 15.5 %    13.2  Platelets Latest Ref Range: 150 - 400 K/uL    221  nRBC Latest Ref Range: 0.0 - 0.2 %    0.0  SARS CORONAVIRUS 2 (TAT 6-24 HRS) Unknown   Rpt   Appearance Unknown Dark yellow     Bilirubin, UA Unknown Negative     Clarity, UA Unknown Cloudy     Color, UA Unknown Dark yellow     Glucose Latest Ref Range: Negative  Negative     Ketones, UA Unknown Trace     Leukocytes,UA Latest Ref Range: Negative  4+ (A)     Nitrite, UA Unknown Negative     pH, UA Latest Ref Range: 5.0 - 8.0  >=9.0 (A)     Protein,UA Latest Ref Range: Negative  Positive (A)     Specific Gravity, UA Latest Ref Range: 1.010 - 1.025  <=1.005 (A)     Urobilinogen, UA Latest Ref Range: 0.2 or 1.0 E.U./dL 2.0 (A)     RBC, UA Unknown Negative     Odor Unknown Very strong       Procedures: IV hydration/electrolyte and vitamin repletion  Hospital  Course: The patient was admitted to Labor and Delivery Triage for observation.   Discharge Condition: good  Disposition: Discharge disposition: 01-Home or Self Care       Diet: Regular diet  Discharge Activity: Activity as tolerated  Discharge Instructions    Discharge activity:  No Restrictions   Complete by: As directed    Discharge diet:  No restrictions   Complete by: As directed    No sexual activity restrictions   Complete by: As directed    Notify physician for a general feeling that "something is not right"   Complete by: As directed    Notify physician for increase or change in vaginal discharge   Complete by: As directed    Notify physician for intestinal cramps, with or without diarrhea, sometimes described as "gas pain"   Complete by: As directed    Notify physician for  leaking of fluid   Complete by: As directed    Notify physician for low, dull backache, unrelieved by heat or Tylenol   Complete by: As directed    Notify physician for menstrual like cramps   Complete by: As directed    Notify physician for pelvic pressure   Complete by: As directed    Notify physician for uterine contractions.  These may be painless and feel like the uterus is tightening or the baby is  "balling up"   Complete by: As directed    Notify physician for vaginal bleeding   Complete by: As directed      Allergies as of 06/16/2019      Reactions   Amoxicillin Hives   Bee Venom Swelling, Shortness Of Breath   Coconut Oil Shortness Of Breath, Swelling   Mushroom Extract Complex Swelling   Other Anaphylaxis, Swelling   Coconut Coconut      Medication List    STOP taking these medications   carbamide peroxide 6.5 % OTIC solution Commonly known as: DEBROX   metoCLOPramide 10 MG tablet Commonly known as: REGLAN     TAKE these medications   ALPRAZolam 0.25 MG tablet Commonly known as: XANAX Take 0.25 mg by mouth 2 (two) times daily as needed for anxiety.   cephALEXin 500 MG capsule Commonly known as: KEFLEX Take 1 capsule (500 mg total) by mouth 2 (two) times daily.   ondansetron 4 MG disintegrating tablet Commonly known as: ZOFRAN-ODT Take 4 mg by mouth every 8 (eight) hours as needed for nausea.   oxybutynin 5 MG 24 hr tablet Commonly known as: DITROPAN-XL Take 5 mg by mouth daily.   promethazine 25 MG tablet Commonly known as: PHENERGAN Take 1 tablet (25 mg total) by mouth every 8 (eight) hours as needed for nausea or vomiting.      Follow-up Information    [facility]. Go to.   Specialty: Obstetrics and Gynecology Why: scheduled prenatal appointment Contact information: [address] [place] [phone]          Total time spent taking care of this patient: 20 minutes  Signed: Tresea MallJane  Khaidyn Staebell, CNM  06/16/2019, 9:08 AM

## 2019-06-16 NOTE — Progress Notes (Signed)
Patient discharged home. Discharge instructions and prescriptions given and reviewed with patient. Patient verbalized understanding.  Escorted out by staff.   

## 2019-06-17 ENCOUNTER — Emergency Department
Admission: EM | Admit: 2019-06-17 | Discharge: 2019-06-17 | Discharge status: Against Medical Advice | Payer: Medicaid Other

## 2019-06-17 LAB — URINE CULTURE

## 2019-06-17 LAB — MONITOR DRUG PROFILE 10(MW)
Amphetamine Scrn, Ur: NEGATIVE ng/mL
BARBITURATE SCREEN URINE: NEGATIVE ng/mL
BENZODIAZEPINE SCREEN, URINE: NEGATIVE ng/mL
CANNABINOIDS UR QL SCN: NEGATIVE ng/mL
Cocaine (Metab) Scrn, Ur: NEGATIVE ng/mL
Creatinine(Crt), U: 240.3 mg/dL (ref 20.0–300.0)
Methadone Screen, Urine: NEGATIVE ng/mL
OXYCODONE+OXYMORPHONE UR QL SCN: NEGATIVE ng/mL
Opiate Scrn, Ur: NEGATIVE ng/mL
Ph of Urine: 8.1 (ref 4.5–8.9)
Phencyclidine Qn, Ur: NEGATIVE ng/mL
Propoxyphene Scrn, Ur: NEGATIVE ng/mL

## 2019-06-20 LAB — CYTOLOGY - PAP
Chlamydia: NEGATIVE
Diagnosis: NEGATIVE
High risk HPV: NEGATIVE
Neisseria Gonorrhea: NEGATIVE

## 2019-06-21 ENCOUNTER — Encounter: Payer: Medicaid Other | Admitting: Obstetrics and Gynecology

## 2019-06-28 ENCOUNTER — Ambulatory Visit (INDEPENDENT_AMBULATORY_CARE_PROVIDER_SITE_OTHER): Payer: Medicaid Other | Admitting: Advanced Practice Midwife

## 2019-06-28 ENCOUNTER — Other Ambulatory Visit: Payer: Self-pay

## 2019-06-28 ENCOUNTER — Encounter: Payer: Self-pay | Admitting: Advanced Practice Midwife

## 2019-06-28 VITALS — BP 104/64 | Wt 183.0 lb

## 2019-06-28 DIAGNOSIS — Z3A09 9 weeks gestation of pregnancy: Secondary | ICD-10-CM

## 2019-06-28 DIAGNOSIS — Z3481 Encounter for supervision of other normal pregnancy, first trimester: Secondary | ICD-10-CM

## 2019-06-28 NOTE — Progress Notes (Signed)
Pt went to hospital for some vaginal bleeding.

## 2019-06-28 NOTE — Progress Notes (Signed)
  Routine Prenatal Care Visit  Subjective  Lisa Rowland is a 33 y.o. [id number] at [redacted]w[redacted]d being seen today for ongoing prenatal care.  She is currently monitored for the following issues for this high-risk pregnancy and has HPV test positive; Bipolar affective disorder ([place]); Continuous opioid dependence ([place]); Headache, migraine; Myofascial pain; Opioid dependence ([place]); Cervical pain; Clinical trial participant; Scoliosis; Trichomonal fluor vaginalis; Lesion of vulva; Supervision of high-risk pregnancy; Hyperemesis affecting pregnancy, antepartum; and Hyperemesis on their problem list.  ----------------------------------------------------------------------------------- Patient reports she is still having nausea but no more vomiting. She is able to keep food and fluids down. She is no longer taking any anti-emetic medications. She had 2 recent episodes of vaginal spotting- 1 week ago and 2 days ago. She was seen at [facility] 2 days ago. They re-prescribed treatment for Trichomonas- vaginal gel instead of pills and suggested that the bleeding could be from the STD. An ultrasound was done with reassuring findings- they did mention cardiac activity present but did not mention the heart rate.  They also mentioned that her cervix was soft and not closed although I do not see mention of that in the ultrasound report. We discussed having the patient see Dr Gilman Schmidt at her next visit to discuss the need for a cerclage.   . Vag. Bleeding: None.   . Leaking Fluid denies.  ----------------------------------------------------------------------------------- The following portions of the patient's history were reviewed and updated as appropriate: allergies, current medications, past family history, past medical history, past social history, past surgical history and problem list. Problem list updated.  Objective  Blood pressure 104/64, weight 183 lb (83 kg). Pregravid weight 194 lb (88 kg) Total Weight Gain -11  lb (-4.99 kg) Urinalysis: Urine Protein    Urine Glucose    Fetal Status:           General:  Alert, oriented and cooperative. Patient is in no acute distress.  Skin: Skin is warm and dry. No rash noted.   Cardiovascular: Normal heart rate noted  Respiratory: Normal respiratory effort, no problems with respiration noted  Abdomen: Soft, gravid, appropriate for gestational age. Pain/Pressure: Absent     Pelvic:  Cervical exam deferred        Extremities: Normal range of motion.     Mental Status: Normal mood and affect. Normal behavior. Normal judgment and thought content.   Assessment   33 y.o. [id number] at [redacted]w[redacted]d by  01/31/2020, by Ultrasound presenting for routine prenatal visit  Plan   Pregnancy #7 Problems (from 06/15/19 to present)    Problem Noted Resolved   Hyperemesis affecting pregnancy, antepartum 06/15/2019 by Rexene Agent, CNM No       Preterm labor symptoms and general obstetric precautions including but not limited to vaginal bleeding, contractions, leaking of fluid and fetal movement were reviewed in detail with the patient. Please refer to After Visit Summary for other counseling recommendations.   Return in about 4 weeks (around 07/26/2019) for with Dr Dennie Maizes.  Rod Can, CNM 06/28/2019 10:54 AM

## 2019-07-14 ENCOUNTER — Other Ambulatory Visit: Payer: Self-pay

## 2019-07-14 ENCOUNTER — Emergency Department
Admission: EM | Admit: 2019-07-14 | Discharge: 2019-07-15 | Disposition: A | Discharge status: Home / Self Care | Payer: Medicaid Other | Attending: Emergency Medicine | Admitting: Emergency Medicine

## 2019-07-14 ENCOUNTER — Other Ambulatory Visit: Payer: Self-pay | Admitting: Maternal Newborn

## 2019-07-14 ENCOUNTER — Encounter: Payer: Self-pay | Admitting: Emergency Medicine

## 2019-07-14 ENCOUNTER — Telehealth: Payer: Self-pay

## 2019-07-14 DIAGNOSIS — O210 Mild hyperemesis gravidarum: Secondary | ICD-10-CM

## 2019-07-14 DIAGNOSIS — R109 Unspecified abdominal pain: Secondary | ICD-10-CM | POA: Diagnosis not present

## 2019-07-14 DIAGNOSIS — O99891 Other specified diseases and conditions complicating pregnancy: Secondary | ICD-10-CM | POA: Insufficient documentation

## 2019-07-14 DIAGNOSIS — Z3A11 11 weeks gestation of pregnancy: Secondary | ICD-10-CM | POA: Insufficient documentation

## 2019-07-14 DIAGNOSIS — O219 Vomiting of pregnancy, unspecified: Secondary | ICD-10-CM | POA: Insufficient documentation

## 2019-07-14 DIAGNOSIS — Z5321 Procedure and treatment not carried out due to patient leaving prior to being seen by health care provider: Secondary | ICD-10-CM | POA: Diagnosis not present

## 2019-07-14 LAB — CBC WITH DIFFERENTIAL/PLATELET
Abs Immature Granulocytes: 0.02 10*3/uL (ref 0.00–0.07)
Basophils Absolute: 0 10*3/uL (ref 0.0–0.1)
Basophils Relative: 1 %
Eosinophils Absolute: 0.1 10*3/uL (ref 0.0–0.5)
Eosinophils Relative: 1 %
HCT: 39.1 % (ref 36.0–46.0)
Hemoglobin: 12.7 g/dL (ref 12.0–15.0)
Immature Granulocytes: 0 %
Lymphocytes Relative: 37 %
Lymphs Abs: 2.3 10*3/uL (ref 0.7–4.0)
MCH: 28 pg (ref 26.0–34.0)
MCHC: 32.5 g/dL (ref 30.0–36.0)
MCV: 86.3 fL (ref 80.0–100.0)
Monocytes Absolute: 0.7 10*3/uL (ref 0.1–1.0)
Monocytes Relative: 11 %
Neutro Abs: 3.1 10*3/uL (ref 1.7–7.7)
Neutrophils Relative %: 50 %
Platelets: 232 10*3/uL (ref 150–400)
RBC: 4.53 MIL/uL (ref 3.87–5.11)
RDW: 13.3 % (ref 11.5–15.5)
WBC: 6.2 10*3/uL (ref 4.0–10.5)
nRBC: 0 % (ref 0.0–0.2)

## 2019-07-14 LAB — COMPREHENSIVE METABOLIC PANEL
ALT: 8 U/L (ref 0–44)
AST: 13 U/L — ABNORMAL LOW (ref 15–41)
Albumin: 3.9 g/dL (ref 3.5–5.0)
Alkaline Phosphatase: 39 U/L (ref 38–126)
Anion gap: 11 (ref 5–15)
BUN: 8 mg/dL (ref 6–20)
CO2: 25 mmol/L (ref 22–32)
Calcium: 9.3 mg/dL (ref 8.9–10.3)
Chloride: 100 mmol/L (ref 98–111)
Creatinine, Ser: 0.54 mg/dL (ref 0.44–1.00)
GFR calc Af Amer: >60 mL/min (ref >60)
GFR calc non Af Amer: >60 mL/min (ref >60)
Glucose, Bld: 82 mg/dL (ref 70–99)
Potassium: 3.8 mmol/L (ref 3.5–5.1)
Sodium: 136 mmol/L (ref 135–145)
Total Bilirubin: 0.5 mg/dL (ref 0.3–1.2)
Total Protein: 7.8 g/dL (ref 6.5–8.1)

## 2019-07-14 LAB — URINALYSIS, COMPLETE (UACMP) WITH MICROSCOPIC
Bacteria, UA: NONE SEEN
Bilirubin Urine: NEGATIVE
Glucose, UA: NEGATIVE mg/dL
Hgb urine dipstick: NEGATIVE
Ketones, ur: NEGATIVE mg/dL
Nitrite: NEGATIVE
Protein, ur: 30 mg/dL — AB
Specific Gravity, Urine: 1.028 (ref 1.005–1.030)
WBC, UA: >50 WBC/hpf — ABNORMAL HIGH (ref 0–5)
pH: 6 (ref 5.0–8.0)

## 2019-07-14 LAB — HCG, QUANTITATIVE, PREGNANCY: hCG, Beta Chain, Quant, S: 91102 m[IU]/mL — ABNORMAL HIGH (ref <5)

## 2019-07-14 MED ORDER — ONDANSETRON HCL 4 MG PO TABS: 4.0000 mg | ORAL_TABLET | Freq: Three times a day (TID) | ORAL | 0 refills | Status: DC | PRN

## 2019-07-14 MED ORDER — METOCLOPRAMIDE HCL 10 MG PO TABS: 10.0000 mg | ORAL_TABLET | Freq: Four times a day (QID) | ORAL | 2 refills | Status: DC | PRN

## 2019-07-14 NOTE — Telephone Encounter (Signed)
Pt aware via vm 

## 2019-07-14 NOTE — ED Triage Notes (Signed)
Patient ambulatory to triage with steady gait, without difficulty or distress noted, mask in place; pt st approx [redacted]wks pregnant; G7 P3, pt at [facility] 5/18; cont to have N/V with abd pain; st unable to take her zofran and phenergan with relief and concerned over dehydration

## 2019-07-14 NOTE — Progress Notes (Signed)
Rx for nausea medications.

## 2019-07-14 NOTE — ED Notes (Signed)
Pt ambulatory to desk at this time without difficulty. Pt inquiring about wait time. This RN apologized for patient's wait. Pt states "I think I'm just going to leave because you aren't telling me nothing and people have gone back before me that got here after me". This RN explained patient going to different areas. Once again explained delay to patient. Pt states "that's how people die out here!".

## 2019-07-14 NOTE — Telephone Encounter (Signed)
Pt c/o not being able to eat or drink anything. The medication she is on does not help. Is there something else she can try? Please advise

## 2019-07-14 NOTE — Telephone Encounter (Signed)
Sent in Reglan and Zofran (non ODT, since she had problems with dissolving tablets in the past).

## 2019-07-23 ENCOUNTER — Other Ambulatory Visit: Payer: Self-pay

## 2019-07-23 ENCOUNTER — Emergency Department
Admission: EM | Admit: 2019-07-23 | Discharge: 2019-07-23 | Disposition: A | Discharge status: Home / Self Care | Payer: Medicaid Other | Attending: Emergency Medicine | Admitting: Emergency Medicine

## 2019-07-23 DIAGNOSIS — R1030 Lower abdominal pain, unspecified: Secondary | ICD-10-CM | POA: Diagnosis not present

## 2019-07-23 DIAGNOSIS — N3090 Cystitis, unspecified without hematuria: Secondary | ICD-10-CM

## 2019-07-23 DIAGNOSIS — Z3A12 12 weeks gestation of pregnancy: Secondary | ICD-10-CM | POA: Diagnosis not present

## 2019-07-23 DIAGNOSIS — Z79899 Other long term (current) drug therapy: Secondary | ICD-10-CM | POA: Diagnosis not present

## 2019-07-23 DIAGNOSIS — O2311 Infections of bladder in pregnancy, first trimester: Secondary | ICD-10-CM | POA: Diagnosis not present

## 2019-07-23 DIAGNOSIS — O99891 Other specified diseases and conditions complicating pregnancy: Secondary | ICD-10-CM | POA: Diagnosis present

## 2019-07-23 LAB — COMPREHENSIVE METABOLIC PANEL
ALT: 9 U/L (ref 0–44)
AST: 12 U/L — ABNORMAL LOW (ref 15–41)
Albumin: 3.7 g/dL (ref 3.5–5.0)
Alkaline Phosphatase: 35 U/L — ABNORMAL LOW (ref 38–126)
Anion gap: 9 (ref 5–15)
BUN: 9 mg/dL (ref 6–20)
CO2: 21 mmol/L — ABNORMAL LOW (ref 22–32)
Calcium: 8.7 mg/dL — ABNORMAL LOW (ref 8.9–10.3)
Chloride: 105 mmol/L (ref 98–111)
Creatinine, Ser: 0.46 mg/dL (ref 0.44–1.00)
GFR calc Af Amer: >60 mL/min (ref >60)
GFR calc non Af Amer: >60 mL/min (ref >60)
Glucose, Bld: 102 mg/dL — ABNORMAL HIGH (ref 70–99)
Potassium: 3.5 mmol/L (ref 3.5–5.1)
Sodium: 135 mmol/L (ref 135–145)
Total Bilirubin: 0.7 mg/dL (ref 0.3–1.2)
Total Protein: 7.2 g/dL (ref 6.5–8.1)

## 2019-07-23 LAB — HCG, QUANTITATIVE, PREGNANCY: hCG, Beta Chain, Quant, S: 66885 m[IU]/mL — ABNORMAL HIGH (ref <5)

## 2019-07-23 LAB — LIPASE, BLOOD: Lipase: 27 U/L (ref 11–51)

## 2019-07-23 LAB — URINALYSIS, COMPLETE (UACMP) WITH MICROSCOPIC
Bacteria, UA: NONE SEEN
Bilirubin Urine: NEGATIVE
Glucose, UA: NEGATIVE mg/dL
Ketones, ur: NEGATIVE mg/dL
Nitrite: NEGATIVE
Protein, ur: NEGATIVE mg/dL
RBC / HPF: >50 RBC/hpf — ABNORMAL HIGH (ref 0–5)
Specific Gravity, Urine: 1.02 (ref 1.005–1.030)
WBC, UA: >50 WBC/hpf — ABNORMAL HIGH (ref 0–5)
pH: 6 (ref 5.0–8.0)

## 2019-07-23 LAB — CBC
HCT: 36.6 % (ref 36.0–46.0)
Hemoglobin: 12 g/dL (ref 12.0–15.0)
MCH: 27.8 pg (ref 26.0–34.0)
MCHC: 32.8 g/dL (ref 30.0–36.0)
MCV: 84.7 fL (ref 80.0–100.0)
Platelets: 215 10*3/uL (ref 150–400)
RBC: 4.32 MIL/uL (ref 3.87–5.11)
RDW: 13.2 % (ref 11.5–15.5)
WBC: 7.6 10*3/uL (ref 4.0–10.5)
nRBC: 0 % (ref 0.0–0.2)

## 2019-07-23 MED ORDER — MONISTAT 3 4 % VA CREA: 1.0000 "application " | TOPICAL_CREAM | Freq: Every day | VAGINAL | 0 refills | Status: DC

## 2019-07-23 MED ORDER — NITROFURANTOIN MACROCRYSTAL 100 MG PO CAPS: 100.0000 mg | ORAL_CAPSULE | Freq: Two times a day (BID) | ORAL | 0 refills | Status: DC

## 2019-07-23 MED ORDER — METOCLOPRAMIDE HCL 10 MG PO TABS: 10.0000 mg | ORAL_TABLET | Freq: Four times a day (QID) | ORAL | 0 refills | Status: DC | PRN

## 2019-07-23 NOTE — ED Triage Notes (Signed)
Pt to the er for lower abd pain and pressure in her back. Pt is [redacted] weeks pregnant and is high risk. Pt is G7, P3. Pt states her cervix does not close completely. Pt reports some spotting.

## 2019-07-23 NOTE — ED Provider Notes (Signed)
[facility] Emergency Department Provider Note  ____________________________________________  Time seen: Approximately 9:28 AM  I have reviewed the triage vital signs and the nursing notes.   HISTORY  Chief Complaint Abdominal Pain    HPI Lisa Rowland is a 33 y.o. female with a history of anxiety bipolar disorder, G7, P3 at [redacted] weeks pregnant, following up with [facility] who complains of low abdominal pain radiating to back for the past 2 days, gradual onset, waxing and waning, no aggravating or alleviating factors, associated with dysuria.  Not crampy.  Has occasional vaginal spotting, no discharge.  [facility], treated for trichomoniasis.  Ultrasound at that time revealed IUP.      Past Medical History:  Diagnosis Date  . Anemia   . Anxiety   . ASCUS favor benign   . Bipolar 1 disorder (HCC)   . HPV in female   . Hypotension   . Scoliosis      Patient Active Problem List   Diagnosis Date Noted  . Hyperemesis affecting pregnancy, antepartum 06/15/2019  . Hyperemesis 06/15/2019  . Supervision of high-risk pregnancy 05/24/2017  . Headache, migraine 01/23/2014  . 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 Clinical trial participant 11/25/2013  . Opioid dependence (HCC) 10/27/2013  . Myofascial pain 10/10/2013  . Continuous opioid dependence (HCC) 09/22/2013  . Bipolar affective disorder (HCC) 08/19/2013  . Scoliosis 08/19/2013  . Lesion of vulva 08/02/2013  . HPV test positive 06/08/2013  . Cervical pain 04/03/2012     Past Surgical History:  Procedure Laterality Date  . INDUCED ABORTION       Prior to Admission medications   Medication Sig Start Date End Date Taking? Authorizing Provider  ALPRAZolam (XANAX) 0.25 MG tablet Take 0.25 mg by mouth 2 (two) times daily as needed for anxiety. 12/16/18   [provider]  cephALEXin (KEFLEX) 500 MG capsule Take 1 capsule (500 mg total) by mouth 2 (two) times daily.  06/16/19   Tresea MallGledhill, Jane, CNM  clindamycin (CLEOCIN) 300 MG capsule TAKE 1 CAPSULE BY MOUTH EVERY 6 HOURS FOR 7 DAYS 06/22/19   [provider]  metoCLOPramide (REGLAN) 10 MG tablet Take 1 tablet (10 mg total) by mouth every 6 (six) hours as needed. 07/23/19   Sharman CheekStafford, Merrit Waugh, MD  MICONAZOLE NITRATE VAGINAL (MONISTAT 3) 4 % CREA Place 1 application vaginally at bedtime. 07/23/19   Sharman CheekStafford, Alaric Gladwin, MD  nitrofurantoin (MACRODANTIN) 100 MG capsule Take 1 capsule (100 mg total) by mouth 2 (two) times daily. 07/23/19   Sharman CheekStafford, Adryan Shin, MD  ondansetron (ZOFRAN) 4 MG tablet Take 1 tablet (4 mg total) by mouth every 8 (eight) hours as needed for nausea or vomiting. 07/14/19   Oswaldo ConroySchmid, Jacelyn Y, CNM  oxybutynin (DITROPAN-XL) 5 MG 24 hr tablet Take 5 mg by mouth daily. 04/07/19   [provider]  albuterol (PROVENTIL HFA;VENTOLIN HFA) 108 (90 Base) MCG/ACT inhaler Inhale 2 puffs into the lungs every 6 (six) hours as needed for wheezing or shortness of breath. 10/10/17 04/03/19  Fisher, Roselyn BeringSusan W, PA-C  omeprazole (PRILOSEC) 20 MG capsule Take 1 capsule (20 mg total) by mouth daily. 07/15/18 04/03/19  Jene EveryKinner, Robert, MD     Allergies Amoxicillin, Bee venom, Coconut oil, Mushroom extract complex, and Other   Family History  Problem Relation Age of Onset  . Osteoarthritis Mother   . Asthma Mother   . Hypertension Mother   . Osteoarthritis Father   . Hypertension Father   .  Osteoarthritis Maternal Grandmother   . Asthma Maternal Grandmother   . Cancer Maternal Grandmother        breast cancer  . Diabetes Maternal Grandmother   . Heart failure Maternal Grandmother   . Hyperlipidemia Maternal Grandmother   . Hypertension Maternal Grandmother   . Migraines Maternal Grandmother   . Thyroid disease Maternal Grandmother   . Osteoarthritis Paternal Grandmother   . Asthma Paternal Grandmother   . Cancer Paternal Grandmother        stomach  . Diabetes Paternal Grandmother   . Heart  failure Paternal Grandmother   . Hyperlipidemia Paternal Grandmother   . Hypertension Paternal Grandmother   . Breast cancer Paternal Aunt   . Hypertension Sister     Social History Social History   Tobacco Use  . Smoking status: Never Smoker  . Smokeless tobacco: Never Used  Substance Use Topics  . Alcohol use: No  . Drug use: No    Review of Systems  Constitutional:   No fever or chills.  ENT:   No sore throat. No rhinorrhea. Cardiovascular:   No chest pain or syncope. Respiratory:   No dyspnea or cough. Gastrointestinal: Positive for abdominal pain without vomiting or diarrhea Musculoskeletal:   Negative for focal pain or swelling All other systems reviewed and are negative except as documented above in ROS and HPI.  ____________________________________________   PHYSICAL EXAM:  VITAL SIGNS: ED Triage Vitals  Enc Vitals Group     BP 07/23/19 0659 (!) 105/58     Pulse Rate 07/23/19 0659 87     Resp 07/23/19 0659 20     Temp 07/23/19 0659 98.9 F (37.2 C)     Temp Source 07/23/19 0659 Oral     SpO2 07/23/19 0659 98 %     Weight 07/23/19 0655 185 lb (83.9 kg)     Height 07/23/19 0655 5\' 7"  (1.702 m)     Head Circumference --      Peak Flow --      Pain Score 07/23/19 0655 7     Pain Loc --      Pain Edu? --      Excl. in GC? --     Vital signs reviewed, nursing assessments reviewed.   Constitutional:   Alert and oriented. Non-toxic appearance. Eyes:   Conjunctivae are normal. EOMI. PERRL. ENT      Head:   Normocephalic and atraumatic.      Nose:   Wearing a mask.      Mouth/Throat:   Wearing a mask.      Neck:   No meningismus. Full ROM. Hematological/Lymphatic/Immunilogical:   No cervical lymphadenopathy. Cardiovascular:   RRR. Symmetric bilateral radial and DP pulses.  No murmurs. Cap refill less than 2 seconds. Respiratory:   Normal respiratory effort without tachypnea/retractions. Breath sounds are clear and equal bilaterally. No  wheezes/rales/rhonchi. Gastrointestinal:   Soft with suprapubic tenderness.  Abdomen nongravid. Non distended. There is no CVA tenderness.  No rebound, rigidity, or guarding. Bedside point-of-care ultrasound performed by me shows intrauterine pregnancy, fetal heart rate of about 150 bpm.  Crown-rump length corresponding to gestational age of [redacted] weeks 1 day, consistent with previous dating. Musculoskeletal:   Normal range of motion in all extremities. No joint effusions.  No lower extremity tenderness.  No edema. Neurologic:   Normal speech and language.  Motor grossly intact. No acute focal neurologic deficits are appreciated.  Skin:    Skin is warm, dry and intact. No rash noted.  No petechiae, purpura, or bullae.  ____________________________________________    LABS (pertinent positives/negatives) (all labs ordered are listed, but only abnormal results are displayed) Labs Reviewed  COMPREHENSIVE METABOLIC PANEL - Abnormal; Notable for the following components:      Result Value   CO2 21 (*)    Glucose, Bld 102 (*)    Calcium 8.7 (*)    AST 12 (*)    Alkaline Phosphatase 35 (*)    All other components within normal limits  URINALYSIS, COMPLETE (UACMP) WITH MICROSCOPIC - Abnormal; Notable for the following components:   Color, Urine YELLOW (*)    APPearance CLOUDY (*)    Hgb urine dipstick MODERATE (*)    Leukocytes,Ua LARGE (*)    RBC / HPF >50 (*)    WBC, UA >50 (*)    All other components within normal limits  HCG, QUANTITATIVE, PREGNANCY - Abnormal; Notable for the following components:   hCG, Beta Chain, [place] Brown, [place] 66,885 (*)    All other components within normal limits  LIPASE, BLOOD  CBC   ____________________________________________   EKG    ____________________________________________    RADIOLOGY  No results found.  ____________________________________________   PROCEDURES Procedures  ____________________________________________    CLINICAL  IMPRESSION / ASSESSMENT AND PLAN / ED COURSE  Medications ordered in the ED: Medications - No data to display  Pertinent labs & imaging results that were available during my care of the patient were reviewed by me and considered in my medical decision making (see chart for details).  ROBI MITTER was evaluated in Emergency Department on 07/23/2019 for the symptoms described in the history of present illness. She was evaluated in the context of the global COVID-19 pandemic, which necessitated consideration that the patient might be at risk for infection with the SARS-CoV-2 virus that causes COVID-19. Institutional protocols and algorithms that pertain to the evaluation of patients at risk for COVID-19 are in a state of rapid change based on information released by regulatory bodies including the CDC and federal and state organizations. These policies and algorithms were followed during the patient's care in the ED.   Patient presents with vaginal spotting, low abdominal pain.  Threatened miscarriage, most likely cystitis though.  Will give a course of nitrofurantoin, as well as Monistat for yeast infection.  Reglan to take as needed for nausea, morning sickness eating plan discussed.  Has follow-up with her OB in 5 days.  Return precautions discussed      ____________________________________________   FINAL CLINICAL IMPRESSION(S) / ED DIAGNOSES    Final diagnoses:  Cystitis  Bladder infection in mother during first trimester of pregnancy     ED Discharge Orders         Ordered    nitrofurantoin (MACRODANTIN) 100 MG capsule  2 times daily     07/23/19 0926    metoCLOPramide (REGLAN) 10 MG tablet  Every 6 hours PRN     07/23/19 0926    MICONAZOLE NITRATE VAGINAL (MONISTAT 3) 4 % CREA  Daily at bedtime     07/23/19 0926          Portions of this note were generated with dragon dictation software. Dictation errors may occur despite best attempts at proofreading.   Carrie Mew, MD 07/23/19 [phone]

## 2019-07-28 ENCOUNTER — Encounter: Payer: Self-pay | Admitting: Obstetrics and Gynecology

## 2019-07-28 ENCOUNTER — Ambulatory Visit (INDEPENDENT_AMBULATORY_CARE_PROVIDER_SITE_OTHER): Payer: Medicaid Other | Admitting: Obstetrics and Gynecology

## 2019-07-28 ENCOUNTER — Other Ambulatory Visit: Payer: Self-pay

## 2019-07-28 VITALS — BP 100/62 | Wt 182.0 lb

## 2019-07-28 DIAGNOSIS — O0990 Supervision of high risk pregnancy, unspecified, unspecified trimester: Secondary | ICD-10-CM

## 2019-07-28 DIAGNOSIS — O09211 Supervision of pregnancy with history of pre-term labor, first trimester: Secondary | ICD-10-CM | POA: Insufficient documentation

## 2019-07-28 DIAGNOSIS — O210 Mild hyperemesis gravidarum: Secondary | ICD-10-CM

## 2019-07-28 DIAGNOSIS — Z8619 Personal history of other infectious and parasitic diseases: Secondary | ICD-10-CM | POA: Insufficient documentation

## 2019-07-28 DIAGNOSIS — O0991 Supervision of high risk pregnancy, unspecified, first trimester: Secondary | ICD-10-CM

## 2019-07-28 DIAGNOSIS — B009 Herpesviral infection, unspecified: Secondary | ICD-10-CM

## 2019-07-28 DIAGNOSIS — Z1379 Encounter for other screening for genetic and chromosomal anomalies: Secondary | ICD-10-CM

## 2019-07-28 DIAGNOSIS — Z3A13 13 weeks gestation of pregnancy: Secondary | ICD-10-CM

## 2019-07-28 DIAGNOSIS — O98519 Other viral diseases complicating pregnancy, unspecified trimester: Secondary | ICD-10-CM | POA: Insufficient documentation

## 2019-07-28 DIAGNOSIS — O98511 Other viral diseases complicating pregnancy, first trimester: Secondary | ICD-10-CM

## 2019-07-28 NOTE — Progress Notes (Signed)
Routine Prenatal Care Visit  Subjective  Lisa Rowland is a 33 y.o. [id number] at [redacted]w[redacted]d being seen today for ongoing prenatal care.  She is currently monitored for the following issues for this high-risk pregnancy and has HPV test positive; Bipolar affective disorder (HCC); Continuous opioid dependence (HCC); Headache, migraine; Myofascial pain; Opioid dependence (HCC); Cervical pain; Clinical trial participant; Scoliosis; Trichomonal fluor vaginalis; Lesion of vulva; Supervision of high-risk pregnancy; Hyperemesis affecting pregnancy, antepartum; Hyperemesis; and Current pregnancy with history of pre-term labor in first trimester on their problem list.  ----------------------------------------------------------------------------------- Patient reports that she was treated in the hospital (07/24/2019) at [facility] with IV flagyl for trichomonas after failing outpatient therapy because of nausea and vomiting.  She says that she was told her cervix was short and starting to dilate and she should consider a cerclage. She says that she had a cerclage with her 2015 pregnancy.  She would like to know when her next ultrasound will be.  Contractions: Not present. Vag. Bleeding: None.  Movement: Absent. Denies leaking of fluid.  ----------------------------------------------------------------------------------- The following portions of the patient's history were reviewed and updated as appropriate: allergies, current medications, past family history, past medical history, past social history, past surgical history and problem list. Problem list updated.   Objective  Blood pressure 100/62, weight 182 lb (82.6 kg). Pregravid weight 194 lb (88 kg) Total Weight Gain -12 lb (-5.443 kg) Urinalysis:      Fetal Status: Fetal Heart Rate (bpm): 140   Movement: Absent     General:  Alert, oriented and cooperative. Patient is in no acute distress.  Skin: Skin is warm and dry. No rash noted.   Cardiovascular: Normal  heart rate noted  Respiratory: Normal respiratory effort, no problems with respiration noted  Abdomen: Soft, gravid, appropriate for gestational age. Pain/Pressure: Absent     Pelvic:  Cervical exam performed Dilation: Closed Effacement (%): 0 Station: -3 No visual evidence of a prior cerclage. Scant white discharge today.   Extremities: Normal range of motion.  Edema: None  Mental Status: Normal mood and affect. Normal behavior. Normal judgment and thought content.     Assessment   33 y.o. 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, antepartum 07/28/2019 by , MD No   Overview Signed 07/28/2019 10:47 PM by Natale Milch, MD    [ ]  Initiate valtrex at 36 weeks      Hyperemesis affecting pregnancy, antepartum 06/15/2019 by Natale Milch, CNM No   Supervision of high-risk pregnancy 05/24/2017 by 06/17/2019, CMA No   Overview Addendum 07/28/2019 10:41 PM by 07/24/2017, MD      Clinic [facility] Prenatal Labs  Dating  6 wk Arne [place] Blood type: A/Positive/-- (09/30 1116)   Genetic Screen  AFP:     NIPT:collected Antibody:Negative (09/30 1116)  Anatomic [place]  Rubella: 12.10 (09/30 1116)  GTT Early:               Third trimester:  RPR: Non Reactive (  09/30 1116)   Flu vaccine  06/16/2019 HBsAg: Negative (09/30 1116)   TDaP vaccine                                               Rhogam: HIV: Non Reactive (09/30 1116)   Baby Food                                               GBS: (For PCN allergy, check sensitivities)   Contraception  Pap: 2020 NIL   Circumcision    Pediatrician  CF:  Support Person  SMA  Prenatal Classes  Hgb electrophoresis:             Gestational age appropriate obstetric precautions including but not limited to vaginal bleeding, contractions, leaking of fluid and fetal movement were reviewed in detail with the patient.    Patient reports that she has a history of cerclage with last baby. Reports it was placed at [facility] in 2015-records reviewed in [place] do not agree with this. She also did not tell me this as part of her initial prenatal visit. Cervix does not have any visual evidence of a prior cerclage.  Patient does not seem to be the best historian. Conflicting information regarding prior pregnancies She told me two were delivered at 36 weeks and in care everywhere she had reported 32 weeks. Could not find delivery records of these prior pregnancies in care everywhere.  Will refer to MFM for further advise regarding this situation.  Could consider initiating 17-P again at 16 weeks and serial cervical length ultrasounds.   Maternit21 testing today.   Adrian Prows MD [facility] 07/28/2019 10:42 PM

## 2019-07-28 NOTE — Progress Notes (Signed)
ROB C/o hospitalized last Sunday, chunky discharge

## 2019-08-01 ENCOUNTER — Other Ambulatory Visit: Payer: Self-pay | Admitting: Obstetrics and Gynecology

## 2019-08-01 ENCOUNTER — Telehealth: Payer: Self-pay

## 2019-08-01 NOTE — Telephone Encounter (Signed)
Pt aware results take about a week to come back. She called triage asking about this.

## 2019-08-02 LAB — MATERNIT21 PLUS CORE+SCA
Fetal Fraction: 8
Monosomy X (Turner Syndrome): NOT DETECTED
Result (T21): NEGATIVE
Trisomy 13 (Patau syndrome): NEGATIVE
Trisomy 18 (Edwards syndrome): NEGATIVE
Trisomy 21 (Down syndrome): NEGATIVE
XXX (Triple X Syndrome): NOT DETECTED
XXY (Klinefelter Syndrome): NOT DETECTED
XYY (Jacobs Syndrome): NOT DETECTED

## 2019-08-03 NOTE — Progress Notes (Signed)
Called and discussed with patient

## 2019-08-03 NOTE — Progress Notes (Signed)
Please make gender envelope for the patient- she would like to pick up gender envelope today.

## 2019-08-08 ENCOUNTER — Encounter: Payer: Medicaid Other | Admitting: Obstetrics and Gynecology

## 2019-08-15 ENCOUNTER — Other Ambulatory Visit: Payer: Self-pay

## 2019-08-15 ENCOUNTER — Ambulatory Visit
Admission: RE | Admit: 2019-08-15 | Discharge: 2019-08-15 | Disposition: A | Discharge status: Home / Self Care | Payer: Medicaid Other | Source: Ambulatory Visit | Attending: Maternal & Fetal Medicine | Admitting: Maternal & Fetal Medicine

## 2019-08-15 DIAGNOSIS — O0992 Supervision of high risk pregnancy, unspecified, second trimester: Secondary | ICD-10-CM

## 2019-08-15 DIAGNOSIS — B009 Herpesviral infection, unspecified: Secondary | ICD-10-CM

## 2019-08-15 DIAGNOSIS — O210 Mild hyperemesis gravidarum: Secondary | ICD-10-CM

## 2019-08-15 DIAGNOSIS — O09211 Supervision of pregnancy with history of pre-term labor, first trimester: Secondary | ICD-10-CM

## 2019-08-15 DIAGNOSIS — O98519 Other viral diseases complicating pregnancy, unspecified trimester: Secondary | ICD-10-CM

## 2019-08-15 NOTE — Progress Notes (Signed)
[facility] Consultation    HPI: Ms. Lisa Rowland is a 33 y.o. [id number] at [redacted]w[redacted]d by earliest available ultrasound at [facility] on 06/09/19; measurements c/w 6 w 2 days on 06/09/19.  Her history is significant for prior PTB x 2 at [redacted] weeks gestation.  She reports recent admission to [facility] for urinary tract infection and hyperemesis. She also reports bleeding earlier in current pregnancy.   Past Medical History:  Anxiety/Depression--she reports past history of situational depression and suicide attempt following death of grandmother, uncle, pregnancy loss, and incarceration of father all in short period of time Anemia HSV 2 Past Surgical History: tympanostomy, correction of postaxial polydactyly, EAb  Obstetric History:  2018, [facility] 2015, 39 weeks, SVD, 7lb 12 oz, took 17P, [facility] 2010, 32 weeks, SVD, 6 lb 12 oz, took 17P, [facility] 2009, [facility] 2005, 32 weeks, SVD, 4lb 12 oz,  Medications: she is taking PNV 'gummy bears' Allergies: Patient is allergic to amoxicillin; bee venom; coconut oil; mushroom extract complex; and other.  Social History: Patient  reports that she has never smoked. She has never used smokeless tobacco. She reports that she does not drink alcohol or use drugs.  Family History: family history includes Asthma in her maternal grandmother, mother, and paternal grandmother; Breast cancer in her paternal aunt; Cancer in her maternal grandmother and paternal grandmother; Diabetes in her maternal grandmother and paternal grandmother; Heart failure in her maternal grandmother and paternal grandmother; Hyperlipidemia in her maternal grandmother and paternal grandmother; Hypertension in her father, maternal grandmother, mother, paternal grandmother, and sister; Migraines in her maternal grandmother; Osteoarthritis in her father, maternal grandmother, mother, and paternal grandmother; Thyroid disease in her maternal grandmother.    Physical Exam: BP 109/67   Pulse 97   Temp  (!) 97.4 F (36.3 C)   Wt 82.8 kg   LMP  (LMP Unknown)   BMI 28.58 kg/m  FHR 150s  Lab Results  Component Value Date   WBC 7.6 07/23/2019   HGB 12.0 07/23/2019   HCT 36.6 07/23/2019   MCV 84.7 07/23/2019   PLT 215 07/23/2019   Lab Results  Component Value Date   ALT 9 07/23/2019   AST 12 (L) 07/23/2019   ALKPHOS 35 (L) 07/23/2019   BILITOT 0.7 07/23/2019     Assessement/Plan: Issues addressed today: 1. Prior preterm birth x 2 at 32 weeks.  We reviewed option of 17P weekly again for preterm birth reduction--she is unsure about whether she wants to resume this therapy due to inconvenience/discomfort of injection.  We did review recent data demonstrating no benefit in a population outside the [place].  We also addressed the option of cervical length assessment and nightly vaginal progesterone use if cervical shortening is noted.  --recommend cervical length assessment q 2 weeks between 16w-22 weeks.  --scheduled cervical length assessment here next week. Will need detailed anatomic survey/cerivcal length in 3 weeks --if she decides she would want 17P, she understands can still receive.  Would need to notify providers in next 2-3 weeks in order to order medication  2. Depression--reports PP depression after pregnancy loss as well as other family losses in same time interval (grandmother, uncle). Close surveillance postpartum --she may benefit from pregnancy care management referral  3. Gen: neg cffDNA screening (mat 21) H/o abnormal pap smears, ascus +hpv--colpo from 2017 (neg ECC) No recent pap seen in records reviewed--will need pap, if not performed   Total time spent with the patient was 30 minutes with greater than 50%  spent in counseling and coordination of care. We appreciate this interesting consult and will be happy to be involved in the ongoing care of Lisa Rowland in anyway her obstetricians desire.  Manfred Shirts, MD [facility]

## 2019-08-18 ENCOUNTER — Other Ambulatory Visit: Payer: Self-pay

## 2019-08-18 DIAGNOSIS — Z3A16 16 weeks gestation of pregnancy: Secondary | ICD-10-CM

## 2019-08-22 ENCOUNTER — Other Ambulatory Visit: Payer: Medicaid Other

## 2019-08-22 ENCOUNTER — Other Ambulatory Visit: Payer: Self-pay | Admitting: Maternal & Fetal Medicine

## 2019-08-22 ENCOUNTER — Ambulatory Visit
Admission: RE | Admit: 2019-08-22 | Discharge: 2019-08-22 | Disposition: A | Discharge status: Home / Self Care | Payer: Medicaid Other | Source: Ambulatory Visit | Attending: Maternal & Fetal Medicine | Admitting: Maternal & Fetal Medicine

## 2019-08-22 ENCOUNTER — Other Ambulatory Visit: Payer: Self-pay

## 2019-08-22 DIAGNOSIS — Z8751 Personal history of pre-term labor: Secondary | ICD-10-CM | POA: Diagnosis not present

## 2019-08-22 DIAGNOSIS — O26872 Cervical shortening, second trimester: Secondary | ICD-10-CM | POA: Diagnosis not present

## 2019-08-22 DIAGNOSIS — Z3A16 16 weeks gestation of pregnancy: Secondary | ICD-10-CM

## 2019-08-22 DIAGNOSIS — O0992 Supervision of high risk pregnancy, unspecified, second trimester: Secondary | ICD-10-CM

## 2019-08-22 DIAGNOSIS — Z3689 Encounter for other specified antenatal screening: Secondary | ICD-10-CM | POA: Insufficient documentation

## 2019-08-22 DIAGNOSIS — B009 Herpesviral infection, unspecified: Secondary | ICD-10-CM

## 2019-08-22 DIAGNOSIS — N883 Incompetence of cervix uteri: Secondary | ICD-10-CM | POA: Insufficient documentation

## 2019-08-22 DIAGNOSIS — O321XX0 Maternal care for breech presentation, not applicable or unspecified: Secondary | ICD-10-CM | POA: Insufficient documentation

## 2019-08-22 DIAGNOSIS — O09211 Supervision of pregnancy with history of pre-term labor, first trimester: Secondary | ICD-10-CM

## 2019-08-22 DIAGNOSIS — O210 Mild hyperemesis gravidarum: Secondary | ICD-10-CM

## 2019-08-22 MED ORDER — FLUCONAZOLE 100 MG PO TABS: 100.0000 mg | ORAL_TABLET | Freq: Once | ORAL | 1 refills | Status: DC

## 2019-08-22 MED ORDER — PROGESTERONE MICRONIZED 200 MG PO CAPS: 200.0000 mg | ORAL_CAPSULE | Freq: Every day | ORAL | 5 refills | Status: DC

## 2019-08-22 NOTE — Progress Notes (Signed)
[place] follow up Consultation     HPI: Ms. Lisa Rowland is a 33 y.o. [id number] at [redacted]w[redacted]d by earliest available ultrasound performed at [facility] on 06/09/19 P measurements were consistent with 6 weeks 2 days.  History significant for prior preterm birth x 2 at [redacted] weeks gestation.  She reports 3d of antibiotics for dental process. She also reports vaginal itching since this am, no c/o discharge.   [place] today demonstrates a cervical length of 2.3cm. no change with valsalva or pressure.   Past Medical History: Patient  has a past medical history of Anemia, Anxiety, ASCUS favor benign, Bipolar 1 disorder ([place]), HPV in female, Hypotension, and Scoliosis.  Past Surgical History: She  has a past surgical history that includes Induced abortion.  Obstetric History:   Physical Exam: Cervix closed/soft/50% +white curdlike discharge on glove.  Plan: Short cervix and history of prior preterm birth.  We addressed option of cerclage v. Vaginal progesterone and reassessment in one week. She would like to try vaginal progesterone.  She is aware of warnings signs and will report to hospital if has symptoms (eg. pressure, increase in discharge, etc)  Progesterone 200mg  PV nightly  Yeast vaginitis, treated clinically given appearance of discharge and sxs. She is at risk given 3d h/o abx for dental process.  She has f/u office appt tomorrow.  May still need exam for other causes of pruritus.   Diflucan 100mg  po x 1 given.   Manfred Shirts, MD [facility]

## 2019-08-22 NOTE — ED Notes (Signed)
Pt c/o vaginal discharge and itching today at MFM appt at [facility].Pt states she has had a hx with Trich and treatment but has not had intercourse since treatment.  Called [facility] to get an appt scheduled for pt to be seen for symptoms tomorrow at 3:00pm. Pt aware of appt date and time.

## 2019-08-22 NOTE — ED Notes (Signed)
Dr. Diamantina Monks performed a vaginal exam on pt and RN was present for exam at the bedside.

## 2019-08-23 ENCOUNTER — Encounter: Payer: Self-pay | Admitting: Obstetrics and Gynecology

## 2019-08-23 ENCOUNTER — Ambulatory Visit (INDEPENDENT_AMBULATORY_CARE_PROVIDER_SITE_OTHER): Payer: Medicaid Other | Admitting: Obstetrics and Gynecology

## 2019-08-23 VITALS — BP 100/58 | Wt 185.0 lb

## 2019-08-23 DIAGNOSIS — O26872 Cervical shortening, second trimester: Secondary | ICD-10-CM

## 2019-08-23 DIAGNOSIS — O26879 Cervical shortening, unspecified trimester: Secondary | ICD-10-CM

## 2019-08-23 DIAGNOSIS — A5901 Trichomonal vulvovaginitis: Secondary | ICD-10-CM

## 2019-08-23 DIAGNOSIS — Z3A17 17 weeks gestation of pregnancy: Secondary | ICD-10-CM

## 2019-08-23 DIAGNOSIS — O0992 Supervision of high risk pregnancy, unspecified, second trimester: Secondary | ICD-10-CM

## 2019-08-23 DIAGNOSIS — N898 Other specified noninflammatory disorders of vagina: Secondary | ICD-10-CM

## 2019-08-23 DIAGNOSIS — Z113 Encounter for screening for infections with a predominantly sexual mode of transmission: Secondary | ICD-10-CM

## 2019-08-23 DIAGNOSIS — O0990 Supervision of high risk pregnancy, unspecified, unspecified trimester: Secondary | ICD-10-CM

## 2019-08-23 NOTE — Progress Notes (Signed)
Routine Prenatal Care Visit  Subjective  Lisa Rowland is a 33 y.o. [id number] at [redacted]w[redacted]d being seen today for ongoing prenatal care.  She is currently monitored for the following issues for this high-risk pregnancy and has HPV test positive; Bipolar affective disorder (HCC); Continuous opioid dependence (HCC); Headache, migraine; Myofascial pain; Opioid dependence (HCC); Cervical pain; Clinical trial participant; Scoliosis; Trichomonal fluor vaginalis; Lesion of vulva; Supervision of high-risk pregnancy; Hyperemesis affecting pregnancy, antepartum; Hyperemesis; Current pregnancy with history of pre-term labor in first trimester; History of herpes genitalis; Herpes virus infection in mother during pregnancy, antepartum; and Short cervix on their problem list.  ----------------------------------------------------------------------------------- Patient reports vaginal irritation.   Contractions: Not present. Vag. Bleeding: None.  Movement: Absent. Denies leaking of fluid.  ----------------------------------------------------------------------------------- The following portions of the patient's history were reviewed and updated as appropriate: allergies, current medications, past family history, past medical history, past social history, past surgical history and problem list. Problem list updated.   Objective  Blood pressure (!) 100/58, weight 185 lb (83.9 kg). Pregravid weight 194 lb (88 kg) Total Weight Gain -9 lb (-4.082 kg) Urinalysis:      Fetal Status: Fetal Heart Rate (bpm): 154   Movement: Absent     General:  Alert, oriented and cooperative. Patient is in no acute distress.  Skin: Skin is warm and dry. No rash noted.   Cardiovascular: Normal heart rate noted  Respiratory: Normal respiratory effort, no problems with respiration noted  Abdomen: Soft, gravid, appropriate for gestational age. Pain/Pressure: Absent     Pelvic:  Cervical exam deferred       Normal discharge on speculum exam   Extremities: Normal range of motion.  Edema: None  Mental Status: Normal mood and affect. Normal behavior. Normal judgment and thought content.   Wet Prep: PH: 4.5 Clue Cells: Negative Fungal elements: Negative Trichomonas: Negative   Assessment   33 y.o. [id number] at 101w0d by  01/31/2020, by Ultrasound presenting for routine prenatal visit  Plan   Pregnancy #7 Problems (from 06/15/19 to present)    Problem Noted Resolved   Current pregnancy with history of pre-term labor in first trimester 07/28/2019 by Natale Milch, MD No   Overview Signed 07/28/2019 10:42 PM by Natale Milch, MD    [ ]  Consider 17-P [ ]  Serial cervical length Ultrasounds between 16-23 weeks      Herpes virus infection in mother during pregnancy, antepartum 07/28/2019 by , MD No   Overview Signed 07/28/2019 10:47 PM by Natale Milch, MD    [ ]  Initiate valtrex at 36 weeks      Hyperemesis affecting pregnancy, antepartum 06/15/2019 by Natale Milch, CNM No   Supervision of high-risk pregnancy 05/24/2017 by 06/17/2019, CMA No   Overview Addendum 07/28/2019 10:41 PM by 07/24/2017, MD      Clinic [facility] Prenatal Labs  Dating  6 wk Arne [place] Blood type: A/Positive/-- (09/30 1116)   Genetic Screen  AFP:     NIPT:collected Antibody:Negative (09/30 1116)  Anatomic [place]  Rubella: 12.10 (09/30 1116)  GTT Early:               Third trimester:  RPR: Non Reactive (09/30 1116)   Flu vaccine  06/16/2019 HBsAg: Negative (09/30 1116)   TDaP vaccine  Rhogam: HIV: Non Reactive (09/30 1116)   Baby Food                                               GBS: (For PCN allergy, check sensitivities)  Contraception  Pap: 2020 NIL   Circumcision    Pediatrician  CF:  Support Person  SMA  Prenatal Classes  Hgb electrophoresis:             Gestational age appropriate obstetric precautions including but not limited to vaginal  bleeding, contractions, leaking of fluid and fetal movement were reviewed in detail with the patient.    Return in about 3 weeks (around 09/13/2019) for [place] with MD and [place].  Vaginal irritation, no evidence of infection on wet mount.  Will confirm with Nuswab.  Follow up in 3 weeks for anatomy scan.  Has MFM follow up planned for short cervix.  Started vaginal progesterone last night for history of PTB and short cervix.  Adrian Prows MD [facility], [facility] 08/23/2019 3:47 PM

## 2019-08-23 NOTE — Progress Notes (Signed)
OB problem visit C/o vaginal itching, and discharge

## 2019-08-26 ENCOUNTER — Telehealth: Payer: Self-pay

## 2019-08-26 ENCOUNTER — Other Ambulatory Visit: Payer: Self-pay | Admitting: Certified Nurse Midwife

## 2019-08-26 NOTE — Telephone Encounter (Signed)
I think we will need to wait for the results of her swab.

## 2019-08-26 NOTE — Telephone Encounter (Signed)
Pt calling triage asking for results. Advised they are not back. She says she is still having a lot of itching/discharge/fishy odor, and vaginal irritation. She is asking for a refill on the vaginal cream since she cannot do the pills. Advised it was the end of the day and could not guarantee of a provider answering to this request, since Dr Gilman Schmidt is not in the office. Could you possibly help?

## 2019-08-26 NOTE — Telephone Encounter (Signed)
Pt aware.

## 2019-08-27 LAB — NUSWAB VAGINITIS PLUS (VG+)
Candida albicans, NAA: NEGATIVE
Candida glabrata, NAA: NEGATIVE
Chlamydia trachomatis, NAA: NEGATIVE
Neisseria gonorrhoeae, NAA: NEGATIVE
Trich vag by NAA: NEGATIVE

## 2019-08-29 ENCOUNTER — Other Ambulatory Visit: Payer: Self-pay | Admitting: Obstetrics and Gynecology

## 2019-08-29 ENCOUNTER — Other Ambulatory Visit: Payer: Self-pay

## 2019-08-29 DIAGNOSIS — Z3A18 18 weeks gestation of pregnancy: Secondary | ICD-10-CM

## 2019-09-01 ENCOUNTER — Other Ambulatory Visit: Payer: Self-pay

## 2019-09-01 ENCOUNTER — Ambulatory Visit
Admission: RE | Admit: 2019-09-01 | Discharge: 2019-09-01 | Disposition: A | Discharge status: Home / Self Care | Payer: Medicaid Other | Source: Ambulatory Visit | Attending: Maternal & Fetal Medicine | Admitting: Maternal & Fetal Medicine

## 2019-09-01 ENCOUNTER — Other Ambulatory Visit: Payer: Self-pay | Admitting: Obstetrics and Gynecology

## 2019-09-01 DIAGNOSIS — B009 Herpesviral infection, unspecified: Secondary | ICD-10-CM

## 2019-09-01 DIAGNOSIS — Z3686 Encounter for antenatal screening for cervical length: Secondary | ICD-10-CM | POA: Diagnosis not present

## 2019-09-01 DIAGNOSIS — Z3A18 18 weeks gestation of pregnancy: Secondary | ICD-10-CM

## 2019-09-01 DIAGNOSIS — O0992 Supervision of high risk pregnancy, unspecified, second trimester: Secondary | ICD-10-CM

## 2019-09-01 DIAGNOSIS — N883 Incompetence of cervix uteri: Secondary | ICD-10-CM

## 2019-09-01 DIAGNOSIS — O210 Mild hyperemesis gravidarum: Secondary | ICD-10-CM

## 2019-09-01 DIAGNOSIS — O09211 Supervision of pregnancy with history of pre-term labor, first trimester: Secondary | ICD-10-CM

## 2019-09-01 DIAGNOSIS — O98519 Other viral diseases complicating pregnancy, unspecified trimester: Secondary | ICD-10-CM

## 2019-09-01 NOTE — Progress Notes (Signed)
Pt to [facility] for scheduled [place] this AM.  Pt had tooth extraction recently but denies pain otherwise.

## 2019-09-13 ENCOUNTER — Other Ambulatory Visit: Payer: Self-pay | Admitting: Obstetrics and Gynecology

## 2019-09-13 ENCOUNTER — Other Ambulatory Visit: Payer: Self-pay

## 2019-09-13 ENCOUNTER — Ambulatory Visit (INDEPENDENT_AMBULATORY_CARE_PROVIDER_SITE_OTHER): Payer: Medicaid Other | Admitting: Obstetrics and Gynecology

## 2019-09-13 ENCOUNTER — Ambulatory Visit (INDEPENDENT_AMBULATORY_CARE_PROVIDER_SITE_OTHER): Payer: Medicaid Other

## 2019-09-13 VITALS — BP 114/54 | Wt 187.0 lb

## 2019-09-13 DIAGNOSIS — B009 Herpesviral infection, unspecified: Secondary | ICD-10-CM

## 2019-09-13 DIAGNOSIS — Z363 Encounter for antenatal screening for malformations: Secondary | ICD-10-CM | POA: Diagnosis not present

## 2019-09-13 DIAGNOSIS — O09211 Supervision of pregnancy with history of pre-term labor, first trimester: Secondary | ICD-10-CM

## 2019-09-13 DIAGNOSIS — O0992 Supervision of high risk pregnancy, unspecified, second trimester: Secondary | ICD-10-CM

## 2019-09-13 DIAGNOSIS — Z3A20 20 weeks gestation of pregnancy: Secondary | ICD-10-CM

## 2019-09-13 DIAGNOSIS — O210 Mild hyperemesis gravidarum: Secondary | ICD-10-CM

## 2019-09-13 DIAGNOSIS — O0990 Supervision of high risk pregnancy, unspecified, unspecified trimester: Secondary | ICD-10-CM

## 2019-09-13 DIAGNOSIS — O98519 Other viral diseases complicating pregnancy, unspecified trimester: Secondary | ICD-10-CM

## 2019-09-13 NOTE — Progress Notes (Signed)
Routine Prenatal Care Visit  Subjective  Lisa Rowland is a 33 y.o. [id number] at [redacted]w[redacted]d being seen today for ongoing prenatal care.  She is currently monitored for the following issues for this high-risk pregnancy and has HPV test positive; Bipolar affective disorder (HCC); Continuous opioid dependence (HCC); Headache, migraine; Myofascial pain; Opioid dependence (HCC); Cervical pain; Clinical trial participant; Scoliosis; Trichomonal fluor vaginalis; Lesion of vulva; Supervision of high-risk pregnancy; Hyperemesis affecting pregnancy, antepartum; Hyperemesis; Current pregnancy with history of pre-term labor in first trimester; History of herpes genitalis; Herpes virus infection in mother during pregnancy, antepartum; and Short cervix on their problem list.  ----------------------------------------------------------------------------------- Patient reports no complaints.   Contractions: Not present. Vag. Bleeding: None.  Movement: Present. Denies leaking of fluid.  ----------------------------------------------------------------------------------- The following portions of the patient's history were reviewed and updated as appropriate: allergies, current medications, past family history, past medical history, past social history, past surgical history and problem list. Problem list updated.   Objective  Blood pressure (!) 114/54, weight 187 lb (84.8 kg). Pregravid weight 194 lb (88 kg) Total Weight Gain -7 lb (-3.175 kg) Urinalysis:      Fetal Status: Fetal Heart Rate (bpm): 145   Movement: Present     General:  Alert, oriented and cooperative. Patient is in no acute distress.  Skin: Skin is warm and dry. No rash noted.   Cardiovascular: Normal heart rate noted  Respiratory: Normal respiratory effort, no problems with respiration noted  Abdomen: Soft, gravid, appropriate for gestational age. Pain/Pressure: Absent     Pelvic:  Cervical exam deferred        Extremities: Normal range of  motion.     ental Status: Normal mood and affect. Normal behavior. Normal judgment and thought content.   US OB Comp + 14 Wk  Result Date: 09/13/2019 Patient Name: Lisa Rowland DOB: 1986/04/22 MRN: [id number] ULTRASOUND REPORT Location: [facility] Date of Service: 09/13/2019 Indications:Anatomy Ultrasound Findings: [place] intrauterine pregnancy is visualized with FHR at 153 BPM. Biometrics give an (U/S) Gestational age of [redacted]w[redacted]d and an (U/S) EDD of 01/28/2020; this correlates with the clinically established Estimated Date of Delivery: 01/31/20 Fetal presentation is Breech. EFW: 361 g ( 13 oz ). Placenta: posterior. Grade: 0 AFI: subjectively normal. Anatomic survey is complete and normal; Gender - female.  Mild pelviectasis bilateral kidneys. The right renal pelvis measures 2.9 mm. The left renal pelvis measures 2.6 mm. The cervix measures 3.2 cm without pressure and 3.0 cm with pressure. Impression: 1. [redacted]w[redacted]d Viable Singleton Intrauterine pregnancy by U/S. 2. (U/S) EDD is consistent with Clinically established Estimated Date of Delivery: 01/31/20 . 3. Normal Anatomy Scan Recommendations: 1.Clinical correlation with the patient's History and Physical Exam. Deanna Artis, RT  There is a singleton gestation with subjectively normal amniotic fluid volume. The fetal biometry correlates with established dating. Detailed evaluation of the fetal anatomy was performed.The fetal anatomical survey appears within normal limits within the resolution of ultrasound as described above.  It must be noted that a normal ultrasound is unable to rule out fetal aneuploidy, subtle defects such as small ASD or VDS may also not be visible on imaging.  Isolated fetal pyelectasis is a common midtrimester ultrasound findings affecting approximately 0.5-4.5% of all pregnancies.  Cut off for upper limits of normal for renal pelvis dilation in the second trimester is 26mm, 56mm in the third trimester.  While usually isolated some  cases may be attributed to underlying renal pathology or Down Syndrome.  Repeat ultrasound  is recommended at 32 weeks.  [facility] Consult Series "Evaluation and management of isolated renal pelviectasis diagnosed on second-trimester Ultrasound" December 2011 [facility] Vena AustriaAndreas Kimberlin Scheel, MD, Merlinda FrederickFACOG [facility], [facility] 09/13/2019, 11:45 AM   US MFM OB Transvaginal  Result Date: 09/01/2019 ----------------------------------------------------------------------  OBSTETRICS REPORT                       (Signed Final 09/01/2019 11:03 am) ---------------------------------------------------------------------- PATIENT INFO:  ID #:       [id number]                          D.O.B.:  1985-09-19 (33 yrs)  Name:       Lisa Rowland                   Visit Date: 09/01/2019 10:45 am ---------------------------------------------------------------------- PERFORMED BY:  Performed By:     Deirdre PeerAbby Clarke            Referred By:      Vincente PoliHRISTANNA R                    Sonographer                              SCHUMAN ---------------------------------------------------------------------- SERVICE(S) PROVIDED:   US MFM OB TRANSVAGINAL                               [phone]  ---------------------------------------------------------------------- INDICATIONS:   [redacted] weeks gestation of pregnancy                Z3A.18  ---------------------------------------------------------------------- FETAL EVALUATION:  Num Of Fetuses:         1  Fetal Heart Rate(bpm):  153  Cardiac Activity:       Present  Presentation:           Breech  Placenta:               Posterior  P. Cord Insertion:      Normal ---------------------------------------------------------------------- BIOMETRY:  BPD:        41  mm     G. Age:  18w 3d         57  %    CI:        71.82   %    70 - 86                                                          FL/HC:      18.4   %    15.8 - 18  HC:       154   mm     G. Age:  18w 3d         47  %    HC/AC:      1.18         1.07 - 1.29  AC:      130.1  mm     G. Age:  18w 4d         56  %    FL/BPD:     69.0   %  FL:       28.3  mm     G. Age:  18w 5d         59  %    FL/AC:      21.8   %    20 - 24  HUM:      28.3  mm     G. Age:  19w 1d         77  %  NFT:       3.3  mm  CM:          4  mm  Est. FW:     247  gm      0 lb 9 oz     60  % ---------------------------------------------------------------------- GESTATIONAL AGE:  U/S Today:     18w 4d                                        EDD:   01/29/20  Best:          18w 2d     Det. ByMarcella Dubs         EDD:   01/31/20                                      (06/09/19) ---------------------------------------------------------------------- ANATOMY:  Cranium:               Within Normal Limits   RVOT:                   Within Normal Limits  Cavum:                 Within Normal Limits   LVOT:                   Within Normal Limits  Ventricles:            Within Normal Limits   Diaphragm:              Within Normal Limits  Choroid Plexus:        Within Normal Limits   Stomach:                Seen  Cerebellum:            Within Normal Limits   Abdominal Wall:         Within Normal Limits  Posterior Fossa:       Within Normal Limits   Bladder:                Within Normal Limits  Face:                  Within Normal Limits   Upper Extremities:      Visualized  Heart:                 4-Chamber view         Lower Extremities:      Visualized                         appears normal  Other:  Limited anatomy done - patient transfer to [facility] ---------------------------------------------------------------------- CERVIX UTERUS ADNEXA:  Cervix  Length:  1.5  cm.  Funnel Width:      1.9  cm.  Left Ovary  WNL  Right Ovary  WNL ---------------------------------------------------------------------- IMPRESSION:  Primary providers: [facility]  Dear Colleagues:  Thank you for referring your patient  fetal anatomical survey  due to prior preterm birth x 2 at 32 weeks.  She was noted to  have  short cervix measuring 2.3 cm last week and given  vaginal progesterone.  She reports no LOF, bleeding or  contractions.  There is a singleton gestation at 18 weeks 2 days.  with  subjectively normal amniotic fluid volume.  The fetal biometry correlates with established dating.  A limited fetal anatomic survey was performed today.   The  fetal anatomical survey appears within normal limits within  the resolution of ultrasound as described above. On  transvaginal ultrasound, the cervical length measured 1.4-  1.6cm with mild funneling.  Findings were reviewed.   Laural would like to pursue cervical  cerclage at this time.  We reviewed risks of procedure  (bleeding, infection, rupture of membranes) and basic  procedure information. She is NPO and will remain with  nothing per mouth.  She is accompanied by her sister/cousin  (cousin who is like a sister to her).  Findings reviewed with primary team and transfer of care to  MFM desired after cerclage placement.  [facility] labor and delivery team notified and expecting her  today.  Thank you for allowing [place] to participate in your patient's care.  Please do not hesitate to contact us if we can be of further  assistance. ----------------------------------------------------------------------                   Consuelo Pandy, MD Electronically Signed Final Report   09/01/2019 11:03 am ----------------------------------------------------------------------  [place] MFM OB Transvaginal  Result Date: 08/22/2019 ----------------------------------------------------------------------  OBSTETRICS REPORT                       (Signed Final 08/22/2019 02:54 pm) ---------------------------------------------------------------------- PATIENT INFO:  ID #:       [id number]                          D.O.B.:  06/25/1986 (33 yrs)  Name:       SHEREL FENNELL                   Visit Date: 08/22/2019 01:38 pm ---------------------------------------------------------------------- PERFORMED BY:  Performed By:      Ceasar Mons          Referred By:      Vincente Poli ---------------------------------------------------------------------- SERVICE(S) PROVIDED:   [place] MFM OB LIMITED                                    [id number]  ---------------------------------------------------------------------- INDICATIONS:   [redacted] weeks gestation of pregnancy                Z3A.16  ---------------------------------------------------------------------- FETAL EVALUATION:  Num Of Fetuses:         1  Fetal Heart Rate(bpm):  153  Cardiac Activity:  Present  Presentation:           Breech  Placenta:               Posterior ---------------------------------------------------------------------- GESTATIONAL AGE:  Best:          16w 6d     Det. By:  Loman Chroman         EDD:   01/31/20                                      (06/09/19) ---------------------------------------------------------------------- CERVIX UTERUS ADNEXA:  Cervix  Length:           2.34  cm. ---------------------------------------------------------------------- IMPRESSION:  Thank you for referring your patient  cervical length. Her  history is singifincant for prior spont preterm birth x 2 at [redacted]  weeks gestation.  She had MFM consultation; plase see that  note for details.  A singleton gestation at 16 weeks 6 days with normal  amniotic fluid was noted.  Dating is by earliest available  [facility]24[facility]21measurements were  consistent with 6 weeks 2 days. Adequate interval growth  noted.  The shortest best cervical length is 2.3cm.  There was no evidence of funneling, dynamic changes, or  response to fundal pressure.  Findings were reviewed.  We again addressed options as  previuosly discussed in her MFM consultaition.  We  discussed option of vaginal progesterone or cervical  cerclage. She is scheduled for a follow up in one week and  would like to try vaginal progesterone nightly and consider   cerclage if there is cervical change (further shortening or  dilation).  Warnings reviewed.  Thank you for allowing [place] to participate in your patient's care.  Please do not hesitate to contact us if we can be of further  assistance. ----------------------------------------------------------------------                   Manfred Shirts, MD Electronically Signed Final Report   08/22/2019 02:54 pm ----------------------------------------------------------------------    Assessment   33 y.o. [id number] at [redacted]w[redacted]d by  01/31/2020, by Ultrasound presenting for routine prenatal visit  Plan   Pregnancy #7 Problems (from 06/15/19 to present)    Problem Noted Resolved   Short cervix 08/22/2019 by Manfred Shirts, MD No   Overview Signed 08/22/2019  2:29 PM by Manfred Shirts, MD    Cervix 2.3cm, closed, soft. Options for cerclage v. Progesterone reviewed. Will begin vaginal progesterone and follow up with anatomy scan, if shorter or dilating, she will consider cerclage placement.       Current pregnancy with history of pre-term labor in first trimester 07/28/2019 by Homero Fellers, MD No   Overview Signed 07/28/2019 10:42 PM by Homero Fellers, MD    [ ]  Consider 17-P [ ]  Serial cervical length Ultrasounds between 16-23 weeks      Herpes virus infection in mother during pregnancy, antepartum 07/28/2019 by Homero Fellers, MD No   Overview Signed 07/28/2019 10:47 PM by Homero Fellers, MD    [ ]  Initiate valtrex at 36 weeks      Hyperemesis affecting pregnancy, antepartum 06/15/2019 by Rexene Agent, CNM No   Supervision of high-risk pregnancy 05/24/2017 by Gretchen Short, CMA No   Overview Addendum 08/23/2019  6:09 PM by Homero Fellers, MD      Clinic [facility] Prenatal Labs  Dating  6 wk  [place] Blood type: A/Positive/-- (09/30 1116)   Genetic Screen NIPT:normal XX Antibody:Negative (09/30 1116)  Anatomic [place] Mild pyelectasis  follow up 32 weeks Rubella: 12.10 (09/30 1116)    GTT Early:               Third trimester:  RPR: Non Reactive (09/30 1116)   Flu vaccine  06/16/2019 HBsAg: Negative (09/30 1116)   TDaP vaccine                                               Rhogam:not needed HIV: Non Reactive (09/30 1116)   Baby Food                                               GBS: (For PCN allergy, check sensitivities)  Contraception  Pap: 2020 NIL   Circumcision    Pediatrician  CF:  Support Person  SMA  Prenatal Classes  Hgb electrophoresis:             Gestational age appropriate obstetric precautions including but not limited to vaginal bleeding, contractions, leaking of fluid and fetal movement were reviewed in detail with the patient.    - discussed borderline pyelectasis  follow up ultrasound at 32 weeks.    Return in about 4 weeks (around 10/11/2019) for ROB.  Vena Austria, MD, Evern Core [facility], [facility] 09/13/2019, 12:03 PM

## 2019-09-13 NOTE — Progress Notes (Signed)
ROB  °Anatomy scan °

## 2019-09-30 ENCOUNTER — Other Ambulatory Visit: Payer: Self-pay

## 2019-10-03 ENCOUNTER — Encounter: Payer: Self-pay | Admitting: Obstetrics and Gynecology

## 2019-10-03 DIAGNOSIS — O3430 Maternal care for cervical incompetence, unspecified trimester: Secondary | ICD-10-CM | POA: Insufficient documentation

## 2019-10-04 ENCOUNTER — Other Ambulatory Visit: Payer: Self-pay

## 2019-10-04 ENCOUNTER — Ambulatory Visit (LOCAL_COMMUNITY_HEALTH_CENTER): Payer: Self-pay

## 2019-10-04 DIAGNOSIS — Z111 Encounter for screening for respiratory tuberculosis: Secondary | ICD-10-CM

## 2019-10-07 ENCOUNTER — Ambulatory Visit (LOCAL_COMMUNITY_HEALTH_CENTER): Payer: Medicaid Other

## 2019-10-07 ENCOUNTER — Other Ambulatory Visit: Payer: Self-pay

## 2019-10-07 DIAGNOSIS — Z111 Encounter for screening for respiratory tuberculosis: Secondary | ICD-10-CM

## 2019-10-07 LAB — TB SKIN TEST
Induration: 0 mm
TB Skin Test: NEGATIVE

## 2019-10-11 ENCOUNTER — Other Ambulatory Visit: Payer: Self-pay

## 2019-10-11 ENCOUNTER — Ambulatory Visit (INDEPENDENT_AMBULATORY_CARE_PROVIDER_SITE_OTHER): Payer: Medicaid Other | Admitting: Obstetrics and Gynecology

## 2019-10-11 VITALS — BP 114/64 | Wt 194.0 lb

## 2019-10-11 DIAGNOSIS — O09211 Supervision of pregnancy with history of pre-term labor, first trimester: Secondary | ICD-10-CM

## 2019-10-11 DIAGNOSIS — B009 Herpesviral infection, unspecified: Secondary | ICD-10-CM

## 2019-10-11 DIAGNOSIS — O98519 Other viral diseases complicating pregnancy, unspecified trimester: Secondary | ICD-10-CM

## 2019-10-11 DIAGNOSIS — O09212 Supervision of pregnancy with history of pre-term labor, second trimester: Secondary | ICD-10-CM

## 2019-10-11 DIAGNOSIS — O3430 Maternal care for cervical incompetence, unspecified trimester: Secondary | ICD-10-CM

## 2019-10-11 DIAGNOSIS — O98512 Other viral diseases complicating pregnancy, second trimester: Secondary | ICD-10-CM

## 2019-10-11 DIAGNOSIS — N883 Incompetence of cervix uteri: Secondary | ICD-10-CM

## 2019-10-11 DIAGNOSIS — O3432 Maternal care for cervical incompetence, second trimester: Secondary | ICD-10-CM

## 2019-10-11 DIAGNOSIS — Z3A24 24 weeks gestation of pregnancy: Secondary | ICD-10-CM

## 2019-10-11 DIAGNOSIS — O0992 Supervision of high risk pregnancy, unspecified, second trimester: Secondary | ICD-10-CM

## 2019-10-11 MED ORDER — OMEPRAZOLE 20 MG PO CPDR: 20.0000 mg | DELAYED_RELEASE_CAPSULE | Freq: Every day | ORAL | 11 refills | Status: DC

## 2019-10-11 NOTE — Progress Notes (Signed)
Leg cramping at night, history of DVT

## 2019-10-11 NOTE — Progress Notes (Addendum)
Routine Prenatal Care Visit  Subjective  Lisa Rowland is a 34 y.o. [id number] at [redacted]w[redacted]d being seen today for ongoing prenatal care.  She is currently monitored for the following issues for this high-risk pregnancy and has HPV test positive; Bipolar affective disorder ([place]); Continuous opioid dependence ([place]); Headache, migraine; Myofascial pain; Opioid dependence ([place]); Cervical pain; Clinical trial participant; Scoliosis; Trichomonal fluor vaginalis; Lesion of vulva; Supervision of high-risk pregnancy; Hyperemesis affecting pregnancy, antepartum; Hyperemesis; Current pregnancy with history of pre-term labor in first trimester; History of herpes genitalis; Herpes virus infection in mother during pregnancy, antepartum; Short cervix; and Cervical cerclage suture present, antepartum on their problem list.  ----------------------------------------------------------------------------------- Patient reports no complaints related to cerclage.  Reports worsening GERD and calf cramps at night. Contractions: Not present. Vag. Bleeding: None.  Movement: Present. Denies leaking of fluid.  ----------------------------------------------------------------------------------- The following portions of the patient's history were reviewed and updated as appropriate: allergies, current medications, past family history, past medical history, past social history, past surgical history and problem list. Problem list updated.   Objective  Blood pressure 114/64, weight 194 lb (88 kg). Pregravid weight 194 lb (88 kg) Total Weight Gain 0 lb (0 kg) Urinalysis:      Fetal Status: Fetal Heart Rate (bpm): 140 Fundal Height: 24 cm Movement: Present     General:  Alert, oriented and cooperative. Patient is in no acute distress.  Skin: Skin is warm and dry. No rash noted.   Cardiovascular: Normal heart rate noted  Respiratory: Normal respiratory effort, no problems with respiration noted  Abdomen: Soft, gravid, appropriate  for gestational age. Pain/Pressure: Absent     Pelvic:  Cervical exam deferred        Extremities: Normal range of motion.     ental Status: Normal mood and affect. Normal behavior. Normal judgment and thought content.     Assessment   34 y.o. [id number] at [redacted]w[redacted]d by  01/31/2020, by Ultrasound presenting for routine prenatal visit  Plan   Pregnancy #7 Problems (from 06/15/19 to present)    Problem Noted Resolved   Cervical cerclage suture present, antepartum 10/03/2019 by Malachy Mood, MD No   Short cervix 08/22/2019 by Manfred Shirts, MD No   Overview Addendum 10/03/2019  1:09 PM by Malachy Mood, MD    Cervix 2.3cm, closed, soft. Options for cerclage v. Progesterone reviewed. Will begin vaginal progesterone and follow up with anatomy scan, if shorter or dilating, she will consider cerclage placement.  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, antepartum 07/28/2019 by Homero Fellers, MD No   Overview Signed 07/28/2019 10:47 PM by Homero Fellers, MD    [ ]  Initiate valtrex at 36 weeks      Hyperemesis affecting pregnancy, antepartum 06/15/2019 by Rexene Agent, CNM No   Supervision of high-risk pregnancy 05/24/2017 by Gretchen Short, CMA No   Overview Addendum 08/23/2019  6:09 PM by Homero Fellers, MD      Clinic [facility] Prenatal Labs  Dating  6 wk [place] Blood type: A/Positive/-- (09/30 1116)   Genetic Screen  AFP:     NIPT:normal XX Antibody:Negative (09/30 1116)  Anatomic [place]  Rubella: 12.10 (09/30 1116)  GTT Early:               Third trimester:  RPR: Non Reactive (09/30 1116)   Flu vaccine  06/16/2019 HBsAg: Negative (09/30 1116)   TDaP vaccine                                                Rhogam:not needed HIV: Non Reactive (09/30 1116)   Baby Food                                               GBS: (For PCN allergy, check sensitivities)  Contraception  Pap: 2020 NIL   Circumcision    Pediatrician  CF:  Support Person  SMA  Prenatal Classes  Hgb electrophoresis:             Gestational age appropriate obstetric precautions including but not limited to vaginal bleeding, contractions, leaking of fluid and fetal movement were reviewed in detail with the patient.    1) Calf cramps - no history of DVT superficial thrombophlebitis based on patient explanation in past.  These occur at night while she sleeping.    2) PNC - 28 week labs ordered for next visit  3) Fetal pyelectasis - follow up US at 32 weeks     4) GERD - rx omeprazole  Return in about 4 weeks (around 11/08/2019) for 28 week labs and ROB.  Vena Austria, MD, [facility], [facility] 10/11/2019, 9:43 AM

## 2019-10-11 NOTE — Addendum Note (Signed)
Addended by: Lorrene Reid on: 10/11/2019 01:17 PM   Modules accepted: Orders

## 2019-10-20 ENCOUNTER — Telehealth: Payer: Self-pay | Admitting: Obstetrics and Gynecology

## 2019-10-20 ENCOUNTER — Encounter: Payer: Self-pay | Admitting: Obstetrics & Gynecology

## 2019-10-20 ENCOUNTER — Observation Stay
Admission: EM | Admit: 2019-10-20 | Discharge: 2019-10-20 | Disposition: A | Discharge status: Home / Self Care | Payer: Medicaid Other | Attending: Certified Nurse Midwife | Admitting: Certified Nurse Midwife

## 2019-10-20 ENCOUNTER — Other Ambulatory Visit: Payer: Self-pay

## 2019-10-20 DIAGNOSIS — Z79899 Other long term (current) drug therapy: Secondary | ICD-10-CM | POA: Insufficient documentation

## 2019-10-20 DIAGNOSIS — F319 Bipolar disorder, unspecified: Secondary | ICD-10-CM | POA: Insufficient documentation

## 2019-10-20 DIAGNOSIS — O99342 Other mental disorders complicating pregnancy, second trimester: Secondary | ICD-10-CM | POA: Diagnosis not present

## 2019-10-20 DIAGNOSIS — Z91030 Bee allergy status: Secondary | ICD-10-CM | POA: Diagnosis not present

## 2019-10-20 DIAGNOSIS — O09211 Supervision of pregnancy with history of pre-term labor, first trimester: Secondary | ICD-10-CM

## 2019-10-20 DIAGNOSIS — O4690 Antepartum hemorrhage, unspecified, unspecified trimester: Principal | ICD-10-CM | POA: Diagnosis present

## 2019-10-20 DIAGNOSIS — O4692 Antepartum hemorrhage, unspecified, second trimester: Secondary | ICD-10-CM | POA: Diagnosis not present

## 2019-10-20 DIAGNOSIS — O0992 Supervision of high risk pregnancy, unspecified, second trimester: Secondary | ICD-10-CM | POA: Diagnosis not present

## 2019-10-20 DIAGNOSIS — B009 Herpesviral infection, unspecified: Secondary | ICD-10-CM

## 2019-10-20 DIAGNOSIS — O3430 Maternal care for cervical incompetence, unspecified trimester: Secondary | ICD-10-CM

## 2019-10-20 DIAGNOSIS — O210 Mild hyperemesis gravidarum: Secondary | ICD-10-CM

## 2019-10-20 DIAGNOSIS — Z3A25 25 weeks gestation of pregnancy: Secondary | ICD-10-CM | POA: Diagnosis not present

## 2019-10-20 DIAGNOSIS — N883 Incompetence of cervix uteri: Secondary | ICD-10-CM

## 2019-10-20 HISTORY — DX: Personal history of other complications of pregnancy, childbirth and the puerperium: Z87.59

## 2019-10-20 HISTORY — DX: Herpesviral infection, unspecified: B00.9

## 2019-10-20 HISTORY — DX: Personal history of pre-term labor: Z87.51

## 2019-10-20 LAB — RUPTURE OF MEMBRANE (ROM)PLUS: Rom Plus: NEGATIVE

## 2019-10-20 LAB — URINE DRUG SCREEN, QUALITATIVE (ARMC ONLY)
Amphetamines, Ur Screen: NOT DETECTED
Barbiturates, Ur Screen: NOT DETECTED
Benzodiazepine, Ur Scrn: NOT DETECTED
Cannabinoid 50 Ng, Ur ~~LOC~~: NOT DETECTED
Cocaine Metabolite,Ur ~~LOC~~: NOT DETECTED
MDMA (Ecstasy)Ur Screen: NOT DETECTED
Methadone Scn, Ur: NOT DETECTED
Opiate, Ur Screen: NOT DETECTED
Phencyclidine (PCP) Ur S: NOT DETECTED
Tricyclic, Ur Screen: NOT DETECTED

## 2019-10-20 LAB — WET PREP, GENITAL
Clue Cells Wet Prep HPF POC: NONE SEEN
Sperm: NONE SEEN
Trich, Wet Prep: NONE SEEN
Yeast Wet Prep HPF POC: NONE SEEN

## 2019-10-20 LAB — URINALYSIS, COMPLETE (UACMP) WITH MICROSCOPIC
Bacteria, UA: NONE SEEN
Bilirubin Urine: NEGATIVE
Glucose, UA: NEGATIVE mg/dL
Ketones, ur: NEGATIVE mg/dL
Nitrite: NEGATIVE
Protein, ur: 30 mg/dL — AB
Specific Gravity, Urine: 1.023 (ref 1.005–1.030)
WBC, UA: >50 WBC/hpf — ABNORMAL HIGH (ref 0–5)
pH: 6 (ref 5.0–8.0)

## 2019-10-20 LAB — CHLAMYDIA/NGC RT PCR (ARMC ONLY)
Chlamydia Tr: NOT DETECTED
N gonorrhoeae: NOT DETECTED

## 2019-10-20 NOTE — OB Triage Note (Signed)
Patient arrived in triage with c/o vaginal bleeding that started upon waking this morning, shorty before coming to hospital. Reports feeling "pressure" like she is constipated. Denies any pain. Denies leaking of fluid or contractions. Reports good fetal movement. Monitors applied and assessing. Peripad in place to monitor bleeding.Cerclage in place.

## 2019-10-20 NOTE — Progress Notes (Signed)
Lisa Rowland is doing well. No further bleeding. Reports that she has been having increased moisture vaginally this pregnancy and has not noticed a change from this baseline. She denies contractions. She denies uterine tenderness. Denies dysuria. Denies frequency. Does not feel the dame as earlier this pregnancy when she did have Trichomonas and a UTI. She feels normal fetal movement.   Bedside US. MVP 5.6 cm- good fetal movement with breathing movements seen. Cephalic, posterior placenta. Fluid subjectively normal.  No uterine tenderness on exam.   FHR: 150 baseline, moderate variability.  No contractions on tocometer.   Discussed PELVIC REST and the importance that she not have intercourse with the cerclage in place. She reported understanding  Will plan close follow up for next week to access for any change in status.  Urine culture pending, no symptoms of infection at this time, will hold off on treatment until culture results.  Trichomonas NAAT in office next week.   Discharge home. Patient in agreement with plan for discharge and close follow up.   Adelene Idler MD [facility], [facility] 10/20/2019 11:29 AM

## 2019-10-20 NOTE — Discharge Summary (Signed)
Physician Discharge Summary   Patient ID: Lisa Rowland [id number] 34 y.o. 01-01-1986  Admit date: 10/20/2019  Discharge date and time: No discharge date for patient encounter.   Admitting Physician: Nadara Mustard, MD   Discharge Physician: Adelene Idler MD  Admission Diagnoses: Vaginal bleeding during pregnancy [O46.90]  Discharge Diagnoses: Same as above.   Admission Condition: good  Discharged Condition: good  Indication for Admission: Evaluation of vaginal bleeding  Hospital Course: see H&P and progress notes  Consults: None  Significant Diagnostic Studies: labs: see EPIC  Treatments: none  Discharge Exam: BP (!) 105/53 (BP Location: Left Arm)   Pulse 69   Temp 98.1 F (36.7 C) (Oral)   Resp 16   Ht 5\' 7"  (1.702 m)   Wt 88.9 kg   LMP  (LMP Unknown)   BMI 30.70 kg/m   General Appearance:    Alert, cooperative, no distress, appears stated age  Head:    Normocephalic, without obvious abnormality, atraumatic  Eyes:    PERRL, conjunctiva/corneas clear, EOM's intact, fundi    benign, both eyes  Ears:    Normal TM's and external ear canals, both ears  Nose:   Nares normal, septum midline, mucosa normal, no drainage    or sinus tenderness  Throat:   Lips, mucosa, and tongue normal; teeth and gums normal  Neck:   Supple, symmetrical, trachea midline, no adenopathy;    thyroid:  no enlargement/tenderness/nodules; no carotid   bruit or JVD  Back:     Symmetric, no curvature, ROM normal, no CVA tenderness  Lungs:     Clear to auscultation bilaterally, respirations unlabored  Chest Wall:    No tenderness or deformity   Heart:    Regular rate and rhythm, S1 and S2 normal, no murmur, rub   or gallop  Breast Exam:    No tenderness, masses, or nipple abnormality  Abdomen:     Soft, non-tender, bowel sounds active all four quadrants,    no masses, no organomegaly  Genitalia:    Normal female without lesion, discharge or tenderness  Rectal:    Normal tone, normal  prostate, no masses or tenderness;   guaiac negative stool  Extremities:   Extremities normal, atraumatic, no cyanosis or edema  Pulses:   2+ and symmetric all extremities  Skin:   Skin color, texture, turgor normal, no rashes or lesions  Lymph nodes:   Cervical, supraclavicular, and axillary nodes normal  Neurologic:   CNII-XII intact, normal strength, sensation and reflexes    throughout    Disposition: Discharge disposition: 01-Home or Self Care       Patient Instructions:  Allergies as of 10/20/2019      Reactions   Amoxicillin Hives   Bee Venom Swelling, Shortness Of Breath   Coconut Oil Shortness Of Breath, Swelling   Mushroom Extract Complex Swelling   Other Anaphylaxis, Swelling   Coconut Coconut      Medication List    TAKE these medications   EPINEPHrine 0.3 mg/0.3 mL Soaj injection Commonly known as: EPI-PEN Inject into the muscle.   omeprazole 20 MG capsule Commonly known as: PRILOSEC Take 1 capsule (20 mg total) by mouth daily.   prenatal multivitamin Tabs tablet Take 1 tablet by mouth daily at 12 noon. Pt takes these as gummies      Activity: activity as tolerated, pelvic rest. Diet: regular diet Wound Care: none needed  Follow-up with [facility] in 4 days.  Signed: 12/18/2019 10/20/2019 11:34 AM

## 2019-10-20 NOTE — Telephone Encounter (Signed)
-----   Message from Natale Milch, MD sent at 10/20/2019 10:59 AM EST ----- Can you schedule this patient for a follow up visit with one of the doctors on 2/8 or 2/9 of 2021.  Thank you,  Dr. Jerene Pitch

## 2019-10-20 NOTE — Telephone Encounter (Signed)
Patient is schedule for 10/24/19 at 1:50 with AMS

## 2019-10-20 NOTE — H&P (Signed)
OB History & Physical   History of Present Illness:  Chief Complaint:  Complains of waking and finding blood on sheets, and blood when she wipes HPI:  Lisa Rowland is a 34 y.o. [id number] female with [facility] 01/31/2020 at [redacted]w[redacted]d dated by a 6wk2d ultrasound.  Her pregnancy has been significantly complicated by a shortened cervix at 18 weeks and cerclage placement on 09/01/2019. She has a history of PPROM and premature delivery at 32 weeks with her first delivery. She reports a preterm birth with her second delivery, however that baby weighted 6#12oz. Her third delivery was at 39 weeks. She received 17P injections with the last 2 pregnancies. This pregnancy is also remarkable for a Trichomonas infection, hyperemesis, and a history of HSV.  She presents to L&D for evaluation of vaginal bleeding.  She reports waking up this morning and finding bloody splotches on her sheets and then she noticed bloody discharge when she wiped. Last intercourse was 2 days ago.  Denied contractions, dysuria, or vulvar itching or irritation.    Prenatal care site: Prenatal care at [facility] in consultation with [place]  has also been remarkable for little weight gain and the following: [facility] Prenatal Labs  Dating  6 wk [place] Blood type: A/Positive/-- (09/30 1116)   Genetic Screen  AFP:     NIPT:normal XX Antibody:Negative (09/30 1116)  Anatomic [place]  Rubella: 12.10 (09/30 1116)  GTT Early:               Third trimester:  RPR: Non Reactive (09/30 1116)   Flu vaccine  06/16/2019 HBsAg: Negative (09/30 1116)   TDaP vaccine                                               Rhogam:not needed HIV: Non Reactive (09/30 1116)   Baby Food                                               GBS: (For PCN allergy, check sensitivities)  Contraception  Pap: 2020 NIL   Circumcision    Pediatrician  CF:  Support Person  SMA  Prenatal Classes  Hgb electrophoresis:        Maternal Medical History:   Past Medical History:   Diagnosis Date  . Anemia   . Anxiety   . ASCUS favor benign   . Bipolar 1 disorder ([place])   . History of preterm delivery    with spontaneous labor in 2010 and 2015  . History of preterm premature rupture of membranes (PPROM)    2010 at 32 weeks  . HPV in female   . HSV infection   . Hypotension   . Scoliosis     Past Surgical History:  Procedure Laterality Date  . CERVICAL CERCLAGE  08/31/2020  . INDUCED ABORTION      Allergies  Allergen Reactions  . Amoxicillin Hives  . Bee Venom Swelling and Shortness Of Breath  . Coconut Oil Shortness Of Breath and Swelling  . Mushroom Extract Complex Swelling  . Other Anaphylaxis and Swelling    Coconut Coconut    Prior to Admission medications   Medication Sig Start Date End Date Taking? Authorizing Provider  omeprazole (PRILOSEC) 20 MG capsule  Take 1 capsule (20 mg total) by mouth daily. 10/11/19  Yes Vena Austria, MD  Prenatal Vit-Fe Fumarate-FA (PRENATAL MULTIVITAMIN) TABS tablet Take 1 tablet by mouth daily at 12 noon. Pt takes these as gummies   Yes [provider]  EPINEPHrine 0.3 mg/0.3 mL IJ SOAJ injection Inject into the muscle. 07/25/19   [provider]  albuterol (PROVENTIL HFA;VENTOLIN HFA) 108 (90 Base) MCG/ACT inhaler Inhale 2 puffs into the lungs every 6 (six) hours as needed for wheezing or shortness of breath. 10/10/17 04/03/19  Fisher, Roselyn Bering, PA-C          Social History: She  reports that she has never smoked. She has never used smokeless tobacco. She reports that she does not drink alcohol or use drugs.  Family History: family history includes Asthma in her maternal grandmother, mother, and paternal grandmother; Breast cancer in her paternal aunt; Cancer in her maternal grandmother and paternal grandmother; Diabetes in her maternal grandmother and paternal grandmother; Heart failure in her maternal grandmother and paternal grandmother; Hyperlipidemia in her maternal grandmother and  paternal grandmother; Hypertension in her father, maternal grandmother, mother, paternal grandmother, and sister; Migraines in her maternal grandmother; Osteoarthritis in her father, maternal grandmother, mother, and paternal grandmother; Thyroid disease in her maternal grandmother.   Review of Systems: Negative x 10 systems reviewed except as noted in the HPI.      Physical Exam:  Vital Signs: BP (!) 105/53 (BP Location: Left Arm)   Pulse 69   Temp 98.1 F (36.7 C) (Oral)   Resp 16   Ht 5\' 7"  (1.702 m)   Wt 88.9 kg   LMP  (LMP Unknown)   BMI 30.70 kg/m  General: BF in no acute distress.  HEENT: normocephalic, atraumatic Heart:  Lungs: normal respiratory effort Abdomen: soft, gravid, non-tender Pelvic:  SSE:  External: Normal external female genitalia  Vagina: pooling of pink brown fluid  Cervix: Cerclage appears intact. No active bleeding. Cervix appears closed  Wet [place]: - clue cells; - trichomonas;  - yeast many RBCs  ROM: + pooling; - ferning Extremities: non-tender, symmetric, no edema bilaterally.   Neurologic: Alert & oriented x 3.   Baseline FHR: 140s with moderate variability Toco: no contractions   Bedside Ultrasound:  Cephalic presentation with subjectively normal AF  Cervix appears closed, no funneling present  Urinalysis    Component Value Date/Time   COLORURINE YELLOW (A) 10/20/2019 0557   APPEARANCEUR CLOUDY (A) 10/20/2019 0557   APPEARANCEUR Hazy 10/11/2014 1710   LABSPEC 1.023 10/20/2019 0557   LABSPEC 1.027 10/11/2014 1710   PHURINE 6.0 10/20/2019 0557   GLUCOSEU NEGATIVE 10/20/2019 0557   GLUCOSEU Negative 10/11/2014 1710   HGBUR LARGE (A) 10/20/2019 0557   BILIRUBINUR NEGATIVE 10/20/2019 0557   BILIRUBINUR Negative 06/15/2019 1058   BILIRUBINUR Negative 10/11/2014 1710   KETONESUR NEGATIVE 10/20/2019 0557   PROTEINUR 30 (A) 10/20/2019 0557   UROBILINOGEN 2.0 (A) 06/15/2019 1058   NITRITE NEGATIVE 10/20/2019 0557   LEUKOCYTESUR MODERATE (A)  10/20/2019 0557   LEUKOCYTESUR Negative 10/11/2014 1710    Assessment/ Plan:  Lisa Rowland is a 34 y.o. 32 female at [redacted]w[redacted]d with vaginal bleeding with cerclage in place  R/O PPROM-fern test was negative, but many RBCs can obscure ferning   ROM PLUS pending  Wet prep was negative   Aptima done  UDS  Continue fetal monitoring  Consider formal ultrasound  Clear liquids  R/O UTI-urine culture ordered  [redacted]w[redacted]d  10/20/2019

## 2019-10-21 LAB — URINE CULTURE
Culture: NO GROWTH
Special Requests: NORMAL

## 2019-10-24 ENCOUNTER — Ambulatory Visit (INDEPENDENT_AMBULATORY_CARE_PROVIDER_SITE_OTHER): Payer: Medicaid Other | Admitting: Obstetrics and Gynecology

## 2019-10-24 ENCOUNTER — Other Ambulatory Visit: Payer: Self-pay

## 2019-10-24 VITALS — BP 98/54 | Wt 194.0 lb

## 2019-10-24 DIAGNOSIS — Z3A25 25 weeks gestation of pregnancy: Secondary | ICD-10-CM

## 2019-10-24 DIAGNOSIS — O3432 Maternal care for cervical incompetence, second trimester: Secondary | ICD-10-CM

## 2019-10-24 DIAGNOSIS — O0992 Supervision of high risk pregnancy, unspecified, second trimester: Secondary | ICD-10-CM

## 2019-10-24 DIAGNOSIS — O210 Mild hyperemesis gravidarum: Secondary | ICD-10-CM

## 2019-10-24 DIAGNOSIS — O3430 Maternal care for cervical incompetence, unspecified trimester: Secondary | ICD-10-CM

## 2019-10-24 DIAGNOSIS — N883 Incompetence of cervix uteri: Secondary | ICD-10-CM

## 2019-10-24 LAB — POCT URINALYSIS DIPSTICK OB
Glucose, UA: NEGATIVE
POC,PROTEIN,UA: NEGATIVE

## 2019-10-24 NOTE — Progress Notes (Signed)
ROB L&D follow up

## 2019-10-24 NOTE — Progress Notes (Signed)
Routine Prenatal Care Visit  Subjective  Lisa Rowland is a 34 y.o. [id number] at [redacted]w[redacted]d being seen today for ongoing prenatal care.  She is currently monitored for the following issues for this high-risk pregnancy and has HPV test positive; Bipolar affective disorder (H[facility]); Continuous opioid dependence ([facility]); Headache, migraine; Myofascial pain; Opioid dependence ([facility]); Cervical pain; Clinical trial participant; Scoliosis; Trichomonal fluor vaginalis; Lesion of vulva; Supervision of high-risk pregnancy; Hyperemesis affecting pregnancy, antepartum; Hyperemesis; Current pregnancy with history of pre-term labor in first trimester; History of herpes genitalis; Herpes virus infection in mother during pregnancy, antepartum; Short cervix; Cervical cerclage suture present, antepartum; and Vaginal bleeding during pregnancy on their problem list.  ----------------------------------------------------------------------------------- Patient reports no complaints.   Contractions: Not present. Vag. Bleeding: None.  Movement: Present. Denies leaking of fluid.  ----------------------------------------------------------------------------------- The following portions of the patient's history were reviewed and updated as appropriate: allergies, current medications, past family history, past medical history, past social history, past surgical history and problem list. Problem list updated.   Objective  Blood pressure (!) 98/54, weight 194 lb (88 kg). Pregravid weight 194 lb (88 kg) Total Weight Gain 0 lb (0 kg) Urinalysis:      Fetal Status: Fetal Heart Rate (bpm): 145 Fundal Height: 25 cm Movement: Present     General:  Alert, oriented and cooperative. Patient is in no acute distress.  Skin: Skin is warm and dry. No rash noted.   Cardiovascular: Normal heart rate noted  Respiratory: Normal respiratory effort, no problems with respiration noted  Abdomen: Soft, gravid, appropriate for gestational age.  Pain/Pressure: Absent     Pelvic:  Cervical exam deferred        Extremities: Normal range of motion.     ental Status: Normal mood and affect. Normal behavior. Normal judgment and thought content.     Assessment   34 y.o. [id number] at [redacted]w[redacted]d by  01/31/2020, by Ultrasound presenting for routine prenatal visit  Plan   Pregnancy #7 Problems (from 06/15/19 to present)    Problem Noted Resolved   Cervical cerclage suture present, antepartum 10/03/2019 by Vena Austria, MD No   Short cervix 08/22/2019 by Consuelo Pandy, MD No   Overview Addendum 10/03/2019  1:09 PM by Vena Austria, MD    Cervix 2.3cm, closed, soft. Options for cerclage v. Progesterone reviewed. Will begin vaginal progesterone and follow up with anatomy scan, if shorter or dilating, she will consider cerclage placement.  - cerclage 09/01/2019      Current pregnancy with history of pre-term labor in first trimester 07/28/2019 by Natale Milch, MD No   Overview Signed 07/28/2019 10:42 PM by Natale Milch, MD    [ ]  Consider 17-P [ ]  Serial cervical length Ultrasounds between 16-23 weeks      Herpes virus infection in mother during pregnancy, antepartum 07/28/2019 by , MD No   Overview Signed 07/28/2019 10:47 PM by Natale Milch, MD    [ ]  Initiate valtrex at 36 weeks      Hyperemesis affecting pregnancy, antepartum 06/15/2019 by Natale Milch, CNM No   Supervision of high-risk pregnancy 05/24/2017 by 06/17/2019, CMA No   Overview Addendum 10/20/2019  8:24 AM by 07/24/2017, CNM      Clinic [facility] Prenatal Labs  Dating  6 wk [place] Blood type: A/Positive/-- (09/30 1116)   Genetic Screen  AFP:     NIPT:normal XX Antibody:Negative (09/30 1116)  Anatomic [place]  Rubella: 12.10 (09/30 1116)  GTT  Early:               Third trimester:  RPR: Non Reactive (09/30 1116)   Flu vaccine  06/16/2019 HBsAg: Negative (09/30 1116)   TDaP vaccine                                                Rhogam:not needed HIV: Non Reactive (09/30 1116)   Baby Food                                               GBS: (For PCN allergy, check sensitivities)  Contraception  Pap: 2020 NIL   Circumcision    Pediatrician  CF:  Support Person  SMA  Prenatal Classes  Hgb electrophoresis:             Gestational age appropriate obstetric precautions including but not limited to vaginal bleeding, contractions, leaking of fluid and fetal movement were reviewed in detail with the patient.    - No further bleeding since L&D visit 2/4  Return has follow up previously scheduled for 28 week labs.  Malachy Mood, MD, [facility], [facility] 10/24/2019, 2:11 PM

## 2019-10-30 ENCOUNTER — Observation Stay
Admission: EM | Admit: 2019-10-30 | Discharge: 2019-10-30 | Payer: Medicaid Other | Attending: Obstetrics and Gynecology | Admitting: Obstetrics and Gynecology

## 2019-10-30 ENCOUNTER — Encounter: Payer: Self-pay | Admitting: Obstetrics and Gynecology

## 2019-10-30 DIAGNOSIS — Z91030 Bee allergy status: Secondary | ICD-10-CM | POA: Insufficient documentation

## 2019-10-30 DIAGNOSIS — O0992 Supervision of high risk pregnancy, unspecified, second trimester: Secondary | ICD-10-CM | POA: Diagnosis not present

## 2019-10-30 DIAGNOSIS — Z8349 Family history of other endocrine, nutritional and metabolic diseases: Secondary | ICD-10-CM | POA: Insufficient documentation

## 2019-10-30 DIAGNOSIS — Z800 Family history of malignant neoplasm of digestive organs: Secondary | ICD-10-CM | POA: Insufficient documentation

## 2019-10-30 DIAGNOSIS — Z833 Family history of diabetes mellitus: Secondary | ICD-10-CM | POA: Insufficient documentation

## 2019-10-30 DIAGNOSIS — Z803 Family history of malignant neoplasm of breast: Secondary | ICD-10-CM | POA: Diagnosis not present

## 2019-10-30 DIAGNOSIS — Z3A26 26 weeks gestation of pregnancy: Secondary | ICD-10-CM | POA: Insufficient documentation

## 2019-10-30 DIAGNOSIS — Z820 Family history of epilepsy and other diseases of the nervous system: Secondary | ICD-10-CM | POA: Insufficient documentation

## 2019-10-30 DIAGNOSIS — F319 Bipolar disorder, unspecified: Secondary | ICD-10-CM | POA: Diagnosis not present

## 2019-10-30 DIAGNOSIS — N883 Incompetence of cervix uteri: Secondary | ICD-10-CM

## 2019-10-30 DIAGNOSIS — O99342 Other mental disorders complicating pregnancy, second trimester: Secondary | ICD-10-CM | POA: Insufficient documentation

## 2019-10-30 DIAGNOSIS — Z8249 Family history of ischemic heart disease and other diseases of the circulatory system: Secondary | ICD-10-CM | POA: Insufficient documentation

## 2019-10-30 DIAGNOSIS — Z91018 Allergy to other foods: Secondary | ICD-10-CM | POA: Insufficient documentation

## 2019-10-30 DIAGNOSIS — O210 Mild hyperemesis gravidarum: Secondary | ICD-10-CM

## 2019-10-30 DIAGNOSIS — B009 Herpesviral infection, unspecified: Secondary | ICD-10-CM

## 2019-10-30 DIAGNOSIS — Z880 Allergy status to penicillin: Secondary | ICD-10-CM | POA: Insufficient documentation

## 2019-10-30 DIAGNOSIS — O09211 Supervision of pregnancy with history of pre-term labor, first trimester: Secondary | ICD-10-CM

## 2019-10-30 DIAGNOSIS — M419 Scoliosis, unspecified: Secondary | ICD-10-CM | POA: Insufficient documentation

## 2019-10-30 DIAGNOSIS — O42912 Preterm premature rupture of membranes, unspecified as to length of time between rupture and onset of labor, second trimester: Principal | ICD-10-CM | POA: Insufficient documentation

## 2019-10-30 DIAGNOSIS — O3430 Maternal care for cervical incompetence, unspecified trimester: Secondary | ICD-10-CM

## 2019-10-30 DIAGNOSIS — Z79899 Other long term (current) drug therapy: Secondary | ICD-10-CM | POA: Diagnosis not present

## 2019-10-30 DIAGNOSIS — O4290 Premature rupture of membranes, unspecified as to length of time between rupture and onset of labor, unspecified weeks of gestation: Secondary | ICD-10-CM | POA: Diagnosis present

## 2019-10-30 DIAGNOSIS — Z8261 Family history of arthritis: Secondary | ICD-10-CM | POA: Diagnosis not present

## 2019-10-30 DIAGNOSIS — Z825 Family history of asthma and other chronic lower respiratory diseases: Secondary | ICD-10-CM | POA: Diagnosis not present

## 2019-10-30 HISTORY — DX: Premature rupture of membranes, unspecified as to length of time between rupture and onset of labor, unspecified weeks of gestation: O42.90

## 2019-10-30 LAB — RUPTURE OF MEMBRANE (ROM)PLUS: Rom Plus: POSITIVE

## 2019-10-30 LAB — GROUP B STREP BY PCR: Group B strep by PCR: NEGATIVE

## 2019-10-30 MED ORDER — MAGNESIUM SULFATE 40 GM/1000ML IV SOLN
2.0000 g/h | INTRAVENOUS | Status: DC
  Administered 2019-10-30: 08:00:00 2 g/h via INTRAVENOUS

## 2019-10-30 MED ORDER — LACTATED RINGERS IV SOLN
INTRAVENOUS | Status: DC
  Administered 2019-10-30: 08:00:00 980 mL via INTRAVENOUS

## 2019-10-30 MED ORDER — CEFAZOLIN SODIUM-DEXTROSE 2-4 GM/100ML-% IV SOLN
2.0000 g | Freq: Three times a day (TID) | INTRAVENOUS | Status: DC
  Administered 2019-10-30: 09:00:00 2 g via INTRAVENOUS
  Filled 2019-10-30 (×3): qty 100

## 2019-10-30 MED ORDER — BETAMETHASONE SOD PHOS & ACET 6 (3-3) MG/ML IJ SUSP
12.0000 mg | Freq: Once | INTRAMUSCULAR | Status: AC
  Administered 2019-10-30: 08:00:00 12 mg via INTRAMUSCULAR

## 2019-10-30 MED ORDER — MAGNESIUM SULFATE 40 GM/1000ML IV SOLN
INTRAVENOUS | Status: AC
  Filled 2019-10-30: qty 1000

## 2019-10-30 MED ORDER — BETAMETHASONE SOD PHOS & ACET 6 (3-3) MG/ML IJ SUSP
INTRAMUSCULAR | Status: AC
  Filled 2019-10-30: qty 5

## 2019-10-30 MED ORDER — SODIUM CHLORIDE 0.9 % IV SOLN
250.0000 mg | Freq: Four times a day (QID) | INTRAVENOUS | Status: DC
  Filled 2019-10-30 (×4): qty 5

## 2019-10-30 MED ORDER — MAGNESIUM SULFATE BOLUS VIA INFUSION
4.0000 g | Freq: Once | INTRAVENOUS | Status: AC
  Administered 2019-10-30: 4 g via INTRAVENOUS
  Filled 2019-10-30: qty 1000

## 2019-10-30 NOTE — Discharge Summary (Signed)
Physician Discharge Summary   Patient ID: Lisa Rowland [id number] 34 y.o. 1986-08-29  Admit date: 10/30/2019  Discharge date and time: No discharge date for patient encounter.   Admitting Physician: Homero Fellers, MD   Discharge Physician: Adrian Prows MD  Admission Diagnoses: Premature rupture of membranes [O42.90]  Discharge Diagnoses: Premature Prelabor rupture of membranes  Admission Condition: good  Discharged Condition: good  Indication for Admission: Evaluation of leakage of fluid  Hospital Course: [name] was admitted to labor and delivery for evaluation of leakage of fluid.  She is found to be grossly ruptured.  Cerclage was in place and patient was not laboring. No contractions noted.  Cerclage was not on tension and cerclage was maintained. Started on IV magnesium, latency antibiotics and steroids.  Transfer to [facility] arranged Dr. Cecilie Lowers accepting.   Consults: None  Significant Diagnostic Studies: labs: see EPIC  Treatments: IV hydration, antibiotics: Ancef and erythromycin, steroids: betamethasone and magnesium  Discharge Exam: BP (!) 103/53   Pulse 73   Temp 98.9 F (37.2 C) (Oral)   Resp 20   Ht 5\' 7"  (1.702 m)   Wt 88.9 kg   LMP  (LMP Unknown)   SpO2 100%   BMI 30.70 kg/m   General Appearance:    Alert, cooperative, no distress, appears stated age  Head:    Normocephalic, without obvious abnormality, atraumatic  Eyes:    PERRL, conjunctiva/corneas clear, EOM's intact, fundi    benign, both eyes  Ears:    Normal TM's and external ear canals, both ears  Nose:   Nares normal, septum midline, mucosa normal, no drainage    or sinus tenderness  Throat:   Lips, mucosa, and tongue normal; teeth and gums normal  Neck:   Supple, symmetrical, trachea midline, no adenopathy;    thyroid:  no enlargement/tenderness/nodules; no carotid   bruit or JVD  Back:     Symmetric, no curvature, ROM normal, no CVA tenderness  Lungs:     Clear to auscultation  bilaterally, respirations unlabored  Chest Wall:    No tenderness or deformity   Heart:    Regular rate and rhythm, S1 and S2 normal, no murmur, rub   or gallop  Breast Exam:    No tenderness, masses, or nipple abnormality  Abdomen:     Soft, non-tender, bowel sounds active all four quadrants,    no masses, no organomegaly  Genitalia:    Normal female without lesion, discharge or tenderness  Rectal:    Normal tone, normal prostate, no masses or tenderness;   guaiac negative stool  Extremities:   Extremities normal, atraumatic, no cyanosis or edema  Pulses:   2+ and symmetric all extremities  Skin:   Skin color, texture, turgor normal, no rashes or lesions  Lymph nodes:   Cervical, supraclavicular, and axillary nodes normal  Neurologic:   CNII-XII intact, normal strength, sensation and reflexes    throughout    Disposition: Discharge disposition: 01-Home or Self Care       Patient Instructions:  Allergies as of 10/30/2019      Reactions   Amoxicillin Hives   Bee Venom Swelling, Shortness Of Breath   Coconut Oil Shortness Of Breath, Swelling   Mushroom Extract Complex Swelling   Other Anaphylaxis, Swelling   Coconut Coconut      Medication List    TAKE these medications   EPINEPHrine 0.3 mg/0.3 mL Soaj injection Commonly known as: EPI-PEN Inject into the muscle.   omeprazole 20 MG capsule  Commonly known as: PRILOSEC Take 1 capsule (20 mg total) by mouth daily.   prenatal multivitamin Tabs tablet Take 1 tablet by mouth daily at 12 noon. Pt takes these as gummies      Activity: activity as tolerated Diet: regular diet Wound Care: none needed  Follow-up with [facility] PRN.  Signed: Natale Milch 10/30/2019 9:10 AM

## 2019-10-30 NOTE — H&P (Signed)
Lisa Rowland is an 34 y.o. female.    Chief Complaint: leakage of fluid  HPI: Patient presents to labor and delivery this morning reporting continuous leakage of fluid.  She says that she woke up with the leakage of fluid. The bed was soaked. She has had continuous leakage since she woke up. She reports her clothes and her car are saturated with fluid.  She reports diminished fetal movement. She has reported she needs to push and feels pressure, however she is visually comfortable. No contractions present.  Her pregnancy has been complicated by cerclage placement on 09/01/2019. She was seen 10 days ago for vaginal bleeding.  Was negative for gonorrhea, chlamydia and trichomonas at that time.   Pregnancy #7 Problems (from 06/15/19 to present)    Problem Noted Resolved   Cervical cerclage suture present, antepartum 10/03/2019 by Malachy Mood, MD No   Short cervix 08/22/2019 by Manfred Shirts, MD No   Overview Addendum 10/03/2019  1:09 PM by Malachy Mood, MD    Cervix 2.3cm, closed, soft. Options for cerclage v. Progesterone reviewed. Will begin vaginal progesterone and follow up with anatomy scan, if shorter or dilating, she will consider cerclage placement.  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, antepartum 07/28/2019 by Homero Fellers, MD No   Overview Signed 07/28/2019 10:47 PM by Homero Fellers, MD    [ ]  Initiate valtrex at 36 weeks      Hyperemesis affecting pregnancy, antepartum 06/15/2019 by Rexene Agent, CNM No   Supervision of high-risk pregnancy 05/24/2017 by Gretchen Short, CMA No   Overview Addendum 10/20/2019  8:24 AM by Dalia Heading, [facility] Prenatal Labs  Dating  6 wk [place] Blood type: A/Positive/-- (09/30  1116)   Genetic Screen  AFP:     NIPT:normal XX Antibody:Negative (09/30 1116)  Anatomic [place]  Rubella: 12.10 (09/30 1116)  GTT Early:               Third trimester:  RPR: Non Reactive (09/30 1116)   Flu vaccine  06/16/2019 HBsAg: Negative (09/30 1116)   TDaP vaccine                                               Rhogam:not needed HIV: Non Reactive (09/30 1116)   Baby Food                                               GBS: (For PCN allergy, check sensitivities)  Contraception  Pap: 2020 NIL   Circumcision    Pediatrician  CF:  Support Person  SMA  Prenatal Classes  Hgb electrophoresis:             Past Medical History:  Diagnosis Date  . Anemia   . Anxiety   . ASCUS favor benign   . Bipolar 1 disorder ([place])   . History of preterm delivery    with spontaneous labor in 2010 and  2015  . History of preterm premature rupture of membranes (PPROM)    2010 at 32 weeks  . HPV in female   . HSV infection   . Hypotension   . Scoliosis     Past Surgical History:  Procedure Laterality Date  . CERVICAL CERCLAGE  08/31/2020  . INDUCED ABORTION      Family History  Problem Relation Age of Onset  . Osteoarthritis Mother   . Asthma Mother   . Hypertension Mother   . Osteoarthritis Father   . Hypertension Father   . Osteoarthritis Maternal Grandmother   . Asthma Maternal Grandmother   . Cancer Maternal Grandmother        breast cancer  . Diabetes Maternal Grandmother   . Heart failure Maternal Grandmother   . Hyperlipidemia Maternal Grandmother   . Hypertension Maternal Grandmother   . Migraines Maternal Grandmother   . Thyroid disease Maternal Grandmother   . Osteoarthritis Paternal Grandmother   . Asthma Paternal Grandmother   . Cancer Paternal Grandmother        stomach  . Diabetes Paternal Grandmother   . Heart failure Paternal Grandmother   . Hyperlipidemia Paternal Grandmother   . Hypertension Paternal Grandmother   . Breast cancer Paternal Aunt   . Hypertension  Sister    Social History:  reports that she has never smoked. She has never used smokeless tobacco. She reports that she does not drink alcohol or use drugs.  Allergies:  Allergies  Allergen Reactions  . Amoxicillin Hives  . Bee Venom Swelling and Shortness Of Breath  . Coconut Oil Shortness Of Breath and Swelling  . Mushroom Extract Complex Swelling  . Other Anaphylaxis and Swelling    Coconut Coconut    Medications Prior to Admission  Medication Sig Dispense Refill  . EPINEPHrine 0.3 mg/0.3 mL IJ SOAJ injection Inject into the muscle.    Marland Kitchen omeprazole (PRILOSEC) 20 MG capsule Take 1 capsule (20 mg total) by mouth daily. 30 capsule 11  . Prenatal Vit-Fe Fumarate-FA (PRENATAL MULTIVITAMIN) TABS tablet Take 1 tablet by mouth daily at 12 noon. Pt takes these as gummies      No results found for this or any previous visit (from the past 48 hour(s)). No results found.  Review of Systems  Constitutional: Negative for chills and fever.  HENT: Negative for congestion, hearing loss and sinus pain.   Respiratory: Negative for cough, shortness of breath and wheezing.   Cardiovascular: Negative for chest pain, palpitations and leg swelling.  Gastrointestinal: Negative for abdominal pain, constipation, diarrhea, nausea and vomiting.  Genitourinary: Negative for dysuria, flank pain, frequency, hematuria and urgency.  Musculoskeletal: Negative for back pain.  Skin: Negative for rash.  Neurological: Negative for dizziness and headaches.  Psychiatric/Behavioral: Negative for suicidal ideas. The patient is not nervous/anxious.    There were no vitals taken for this visit. Physical Exam  Nursing note and vitals reviewed. Constitutional: She is oriented to person, place, and time. She appears well-developed and well-nourished.  HENT:  Head: Normocephalic and atraumatic.  Cardiovascular: Normal rate and regular rhythm.  Respiratory: Effort normal and breath sounds normal.  GI: Soft. Bowel  sounds are normal.  Genitourinary:    Genitourinary Comments: Pooling of clear/white fluid in the posterior fornix. Grossly ruptured. Cerclage in place. Not on tension. Visually closed.  No blood seen.  No SVE performed    Musculoskeletal:        General: Normal range of motion.  Neurological: She is alert and oriented  to person, place, and time.  Skin: Skin is warm and dry.  Psychiatric: She has a normal mood and affect. Her behavior is normal. Judgment and thought content normal.    NST: 150 bpm baseline, moderate variability, no accelerations, no decelerations. Tocometer : quiet  No ferning seen under slide. Grossly Ruptured- will confirm with ROM plus Bedside US: cephalic infant, oligohydramnios.   Assessment/Plan 34 yo [id number] [redacted]w[redacted]d with prelabor premature rupture of membranes 1. Will give patient betamethasone. 2. Will start magnesium.  3. Will start latency antibiotics. Ancef and Erythromycin because of amoxicillin allergy.  4. GBS swab collected and sent. 5.  Transfer to [facility] arranged- Dr. Tasia Catchings accepting 6. Cerclage left in place. Patient comfortable and no evidence of labor.    Natale Milch, MD 10/30/2019, 7:19 AM

## 2019-10-30 NOTE — OB Triage Note (Signed)
Patient to triage with LOF that occurred around 630 am her clothes and wheelchair seat are saturated. She is complaining of cramping and has the urge to push. She also states that she had a cerclage placed in December.

## 2019-11-05 MED ORDER — CEPHALEXIN 250 MG/5ML PO SUSR
500.00 | ORAL | Status: DC
Start: 2019-11-05 — End: 2019-11-05

## 2019-11-05 MED ORDER — AZITHROMYCIN 200 MG/5ML PO SUSR
250.00 | ORAL | Status: DC
Start: 2019-11-06 — End: 2019-11-05

## 2019-11-05 MED ORDER — GENERIC EXTERNAL MEDICATION
500.00 | Status: DC
Start: 2019-11-05 — End: 2019-11-05

## 2019-11-05 MED ORDER — ACETAMINOPHEN 325 MG PO TABS
650.00 | ORAL_TABLET | ORAL | Status: DC
Start: ? — End: 2019-11-05

## 2019-11-05 MED ORDER — FLINTSTONES W/IRON 18 MG PO CHEW
1.00 | CHEWABLE_TABLET | ORAL | Status: DC
Start: 2019-11-06 — End: 2019-11-05

## 2019-11-06 ENCOUNTER — Inpatient Hospital Stay
Admission: EM | Admit: 2019-11-06 | Discharge: 2019-11-08 | DRG: 786 | Disposition: A | Discharge status: Home / Self Care | Payer: Medicaid Other | Attending: Obstetrics and Gynecology | Admitting: Obstetrics and Gynecology

## 2019-11-06 ENCOUNTER — Inpatient Hospital Stay: Payer: Medicaid Other | Admitting: Anesthesiology

## 2019-11-06 ENCOUNTER — Encounter: Payer: Self-pay | Admitting: Obstetrics and Gynecology

## 2019-11-06 ENCOUNTER — Encounter
Admission: EM | Disposition: A | Discharge status: Home / Self Care | Payer: Self-pay | Source: Home / Self Care | Attending: Obstetrics and Gynecology

## 2019-11-06 DIAGNOSIS — O0993 Supervision of high risk pregnancy, unspecified, third trimester: Secondary | ICD-10-CM

## 2019-11-06 DIAGNOSIS — O42113 Preterm premature rupture of membranes, onset of labor more than 24 hours following rupture, third trimester: Secondary | ICD-10-CM | POA: Diagnosis not present

## 2019-11-06 DIAGNOSIS — O0990 Supervision of high risk pregnancy, unspecified, unspecified trimester: Secondary | ICD-10-CM

## 2019-11-06 DIAGNOSIS — D62 Acute posthemorrhagic anemia: Secondary | ICD-10-CM | POA: Diagnosis not present

## 2019-11-06 DIAGNOSIS — Z3A27 27 weeks gestation of pregnancy: Secondary | ICD-10-CM | POA: Diagnosis not present

## 2019-11-06 DIAGNOSIS — O9081 Anemia of the puerperium: Secondary | ICD-10-CM | POA: Diagnosis not present

## 2019-11-06 DIAGNOSIS — O9832 Other infections with a predominantly sexual mode of transmission complicating childbirth: Secondary | ICD-10-CM | POA: Diagnosis not present

## 2019-11-06 DIAGNOSIS — O42912 Preterm premature rupture of membranes, unspecified as to length of time between rupture and onset of labor, second trimester: Secondary | ICD-10-CM | POA: Diagnosis present

## 2019-11-06 DIAGNOSIS — O42913 Preterm premature rupture of membranes, unspecified as to length of time between rupture and onset of labor, third trimester: Secondary | ICD-10-CM

## 2019-11-06 DIAGNOSIS — Z98891 History of uterine scar from previous surgery: Secondary | ICD-10-CM

## 2019-11-06 LAB — CBC
HCT: 33.7 % — ABNORMAL LOW (ref 36.0–46.0)
Hemoglobin: 10.8 g/dL — ABNORMAL LOW (ref 12.0–15.0)
MCH: 28.4 pg (ref 26.0–34.0)
MCHC: 32 g/dL (ref 30.0–36.0)
MCV: 88.7 fL (ref 80.0–100.0)
Platelets: 192 10*3/uL (ref 150–400)
RBC: 3.8 MIL/uL — ABNORMAL LOW (ref 3.87–5.11)
RDW: 13.4 % (ref 11.5–15.5)
WBC: 19.5 10*3/uL — ABNORMAL HIGH (ref 4.0–10.5)
nRBC: 0 % (ref 0.0–0.2)

## 2019-11-06 LAB — TYPE AND SCREEN
ABO/RH(D): A POS
Antibody Screen: NEGATIVE

## 2019-11-06 SURGERY — Surgical Case
Anesthesia: General

## 2019-11-06 MED ORDER — PROMETHAZINE HCL 25 MG/ML IJ SOLN: 6.2500 mg | INTRAMUSCULAR | Status: DC | PRN

## 2019-11-06 MED ORDER — PRENATAL MULTIVITAMIN CH: 1.0000 | ORAL_TABLET | Freq: Every day | ORAL | Status: DC

## 2019-11-06 MED ORDER — FENTANYL CITRATE (PF) 100 MCG/2ML IJ SOLN
25.0000 ug | INTRAMUSCULAR | Status: AC | PRN
  Administered 2019-11-06 (×6): 25 ug via INTRAVENOUS

## 2019-11-06 MED ORDER — DIBUCAINE (PERIANAL) 1 % EX OINT: 1.0000 "application " | TOPICAL_OINTMENT | CUTANEOUS | Status: DC | PRN

## 2019-11-06 MED ORDER — LACTATED RINGERS IV SOLN: INTRAVENOUS | Status: DC | PRN

## 2019-11-06 MED ORDER — TETANUS-DIPHTH-ACELL PERTUSSIS 5-2.5-18.5 LF-MCG/0.5 IM SUSP: 0.5000 mL | Freq: Once | INTRAMUSCULAR | Status: DC

## 2019-11-06 MED ORDER — SOD CITRATE-CITRIC ACID 500-334 MG/5ML PO SOLN
30.0000 mL | ORAL | Status: AC
  Administered 2019-11-06: 30 mL via ORAL

## 2019-11-06 MED ORDER — OXYCODONE HCL 5 MG PO TABS: 5.0000 mg | ORAL_TABLET | Freq: Once | ORAL | Status: DC | PRN

## 2019-11-06 MED ORDER — CEFAZOLIN SODIUM-DEXTROSE 2-3 GM-%(50ML) IV SOLR
INTRAVENOUS | Status: DC | PRN
  Administered 2019-11-06: 2 g via INTRAVENOUS

## 2019-11-06 MED ORDER — SUGAMMADEX SODIUM 200 MG/2ML IV SOLN
INTRAVENOUS | Status: DC | PRN
  Administered 2019-11-06: 400 mg via INTRAVENOUS

## 2019-11-06 MED ORDER — ACETAMINOPHEN 10 MG/ML IV SOLN
INTRAVENOUS | Status: AC
  Filled 2019-11-06: qty 100

## 2019-11-06 MED ORDER — CEFAZOLIN (ANCEF) 1 G IV SOLR
INTRAVENOUS | Status: DC | PRN
  Administered 2019-11-06: 2 g via INTRAMUSCULAR

## 2019-11-06 MED ORDER — BUPIVACAINE HCL (PF) 0.5 % IJ SOLN
INTRAMUSCULAR | Status: DC | PRN
  Administered 2019-11-06: 10 mL

## 2019-11-06 MED ORDER — ONDANSETRON HCL 4 MG/2ML IJ SOLN
INTRAMUSCULAR | Status: DC | PRN
  Administered 2019-11-06: 4 mg via INTRAVENOUS

## 2019-11-06 MED ORDER — SODIUM CHLORIDE 0.9 % IV BOLUS
1000.0000 mL | Freq: Once | INTRAVENOUS | Status: AC
  Administered 2019-11-06: 1000 mL via INTRAVENOUS

## 2019-11-06 MED ORDER — FENTANYL CITRATE (PF) 100 MCG/2ML IJ SOLN
INTRAMUSCULAR | Status: AC
  Filled 2019-11-06: qty 2

## 2019-11-06 MED ORDER — LACTATED RINGERS IV SOLN: INTRAVENOUS | Status: DC

## 2019-11-06 MED ORDER — ACETAMINOPHEN 500 MG PO TABS
1000.0000 mg | ORAL_TABLET | Freq: Four times a day (QID) | ORAL | Status: DC
  Filled 2019-11-06 (×2): qty 2

## 2019-11-06 MED ORDER — DEXAMETHASONE SODIUM PHOSPHATE 10 MG/ML IJ SOLN
INTRAMUSCULAR | Status: DC | PRN
  Administered 2019-11-06: 10 mg via INTRAVENOUS

## 2019-11-06 MED ORDER — OXYTOCIN 40 UNITS IN NORMAL SALINE INFUSION - SIMPLE MED: 2.5000 [IU]/h | INTRAVENOUS | Status: AC

## 2019-11-06 MED ORDER — PROPOFOL 10 MG/ML IV BOLUS
INTRAVENOUS | Status: DC | PRN
  Administered 2019-11-06: 120 mg via INTRAVENOUS

## 2019-11-06 MED ORDER — ACETAMINOPHEN 10 MG/ML IV SOLN
INTRAVENOUS | Status: DC | PRN
  Administered 2019-11-06: 1000 mg via INTRAVENOUS

## 2019-11-06 MED ORDER — BUPIVACAINE HCL 0.5 % IJ SOLN
20.0000 mL | INTRAMUSCULAR | Status: DC
  Filled 2019-11-06: qty 20

## 2019-11-06 MED ORDER — OXYCODONE HCL 5 MG/5ML PO SOLN: 5.0000 mg | Freq: Once | ORAL | Status: DC | PRN

## 2019-11-06 MED ORDER — MENTHOL 3 MG MT LOZG
1.0000 | LOZENGE | OROMUCOSAL | Status: DC | PRN
  Filled 2019-11-06: qty 9

## 2019-11-06 MED ORDER — SIMETHICONE 80 MG PO CHEW: 80.0000 mg | CHEWABLE_TABLET | ORAL | Status: DC | PRN

## 2019-11-06 MED ORDER — IBUPROFEN 800 MG PO TABS: 800.0000 mg | ORAL_TABLET | Freq: Three times a day (TID) | ORAL | Status: DC

## 2019-11-06 MED ORDER — OXYTOCIN 40 UNITS IN NORMAL SALINE INFUSION - SIMPLE MED
INTRAVENOUS | Status: AC
  Filled 2019-11-06: qty 1000

## 2019-11-06 MED ORDER — OXYCODONE-ACETAMINOPHEN 5-325 MG PO TABS
1.0000 | ORAL_TABLET | ORAL | Status: DC | PRN
  Administered 2019-11-06: 1 via ORAL
  Administered 2019-11-06 – 2019-11-07 (×2): 2 via ORAL
  Administered 2019-11-07: 1 via ORAL
  Administered 2019-11-07 – 2019-11-08 (×6): 2 via ORAL
  Filled 2019-11-06 (×7): qty 2
  Filled 2019-11-06: qty 1
  Filled 2019-11-06 (×3): qty 2

## 2019-11-06 MED ORDER — BUPIVACAINE 0.25 % ON-Q PUMP DUAL CATH 400 ML
400.0000 mL | INJECTION | Status: DC
  Filled 2019-11-06: qty 400

## 2019-11-06 MED ORDER — MORPHINE SULFATE (PF) 0.5 MG/ML IJ SOLN
INTRAMUSCULAR | Status: DC | PRN
  Administered 2019-11-06: 2 mg via EPIDURAL

## 2019-11-06 MED ORDER — SOD CITRATE-CITRIC ACID 500-334 MG/5ML PO SOLN
ORAL | Status: DC | PRN
  Administered 2019-11-06: 30 mL via ORAL

## 2019-11-06 MED ORDER — MORPHINE SULFATE (PF) 0.5 MG/ML IJ SOLN
INTRAMUSCULAR | Status: AC
  Filled 2019-11-06: qty 10

## 2019-11-06 MED ORDER — BUPIVACAINE HCL (PF) 0.5 % IJ SOLN
INTRAMUSCULAR | Status: AC
  Filled 2019-11-06: qty 30

## 2019-11-06 MED ORDER — SOD CITRATE-CITRIC ACID 500-334 MG/5ML PO SOLN
ORAL | Status: AC
  Filled 2019-11-06: qty 15

## 2019-11-06 MED ORDER — SUCCINYLCHOLINE CHLORIDE 20 MG/ML IJ SOLN
INTRAMUSCULAR | Status: DC | PRN
  Administered 2019-11-06: 120 mg via INTRAVENOUS

## 2019-11-06 MED ORDER — DIPHENHYDRAMINE HCL 25 MG PO CAPS
25.0000 mg | ORAL_CAPSULE | Freq: Four times a day (QID) | ORAL | Status: DC | PRN
  Administered 2019-11-07: 25 mg via ORAL
  Filled 2019-11-06: qty 1

## 2019-11-06 MED ORDER — SIMETHICONE 80 MG PO CHEW
80.0000 mg | CHEWABLE_TABLET | ORAL | Status: DC
  Administered 2019-11-07: 80 mg via ORAL
  Filled 2019-11-06 (×2): qty 1

## 2019-11-06 MED ORDER — SENNOSIDES-DOCUSATE SODIUM 8.6-50 MG PO TABS
2.0000 | ORAL_TABLET | ORAL | Status: DC
  Administered 2019-11-07: 2 via ORAL
  Filled 2019-11-06: qty 2

## 2019-11-06 MED ORDER — FENTANYL CITRATE (PF) 100 MCG/2ML IJ SOLN
INTRAMUSCULAR | Status: DC | PRN
  Administered 2019-11-06 (×2): 50 ug via INTRAVENOUS

## 2019-11-06 MED ORDER — WITCH HAZEL-GLYCERIN EX PADS: 1.0000 "application " | MEDICATED_PAD | CUTANEOUS | Status: DC | PRN

## 2019-11-06 MED ORDER — PROPOFOL 10 MG/ML IV BOLUS
INTRAVENOUS | Status: AC
  Filled 2019-11-06: qty 20

## 2019-11-06 MED ORDER — SIMETHICONE 80 MG PO CHEW
80.0000 mg | CHEWABLE_TABLET | Freq: Three times a day (TID) | ORAL | Status: DC
  Administered 2019-11-06 – 2019-11-08 (×6): 80 mg via ORAL
  Filled 2019-11-06 (×6): qty 1

## 2019-11-06 MED ORDER — IBUPROFEN 800 MG PO TABS
800.0000 mg | ORAL_TABLET | Freq: Three times a day (TID) | ORAL | Status: DC
  Administered 2019-11-07: 800 mg via ORAL
  Filled 2019-11-06 (×4): qty 1

## 2019-11-06 MED ORDER — ROCURONIUM BROMIDE 100 MG/10ML IV SOLN
INTRAVENOUS | Status: DC | PRN
  Administered 2019-11-06: 30 mg via INTRAVENOUS

## 2019-11-06 MED ORDER — SODIUM CHLORIDE 0.9 % IV SOLN
500.0000 mg | Freq: Once | INTRAVENOUS | Status: AC
  Administered 2019-11-06: 500 mg via INTRAVENOUS
  Filled 2019-11-06: qty 500

## 2019-11-06 MED ORDER — KETOROLAC TROMETHAMINE 30 MG/ML IJ SOLN
INTRAMUSCULAR | Status: DC | PRN
  Administered 2019-11-06: 30 mg via INTRAVENOUS

## 2019-11-06 MED ORDER — CEFAZOLIN SODIUM-DEXTROSE 2-4 GM/100ML-% IV SOLN
2.0000 g | INTRAVENOUS | Status: DC
  Filled 2019-11-06: qty 100

## 2019-11-06 SURGICAL SUPPLY — 45 items
BAG COUNTER SPONGE EZ (MISCELLANEOUS) ×2 IMPLANT
BARRIER ADHS 3X4 INTERCEED (GAUZE/BANDAGES/DRESSINGS) ×3 IMPLANT
CANISTER SUCT 3000ML PPV (MISCELLANEOUS) ×6 IMPLANT
CATH KIT ON-Q SILVERSOAK 5IN (CATHETERS) ×6 IMPLANT
CHLORAPREP W/TINT 26 (MISCELLANEOUS) ×9 IMPLANT
CLOSURE WOUND 1/2 X4 (GAUZE/BANDAGES/DRESSINGS) ×1
COUNTER SPONGE BAG EZ (MISCELLANEOUS) ×1
COVER WAND RF STERILE (DRAPES) ×3 IMPLANT
DERMABOND ADVANCED (GAUZE/BANDAGES/DRESSINGS) ×2
DERMABOND ADVANCED .7 DNX12 (GAUZE/BANDAGES/DRESSINGS) ×1 IMPLANT
DRSG OPSITE POSTOP 4X10 (GAUZE/BANDAGES/DRESSINGS) ×3 IMPLANT
DRSG TELFA 3X8 NADH (GAUZE/BANDAGES/DRESSINGS) ×6 IMPLANT
ELECT CAUTERY BLADE 6.4 (BLADE) ×6 IMPLANT
ELECT REM PT RETURN 9FT ADLT (ELECTROSURGICAL) ×6
ELECTRODE REM PT RTRN 9FT ADLT (ELECTROSURGICAL) ×2 IMPLANT
GAUZE SPONGE 4X4 12PLY STRL (GAUZE/BANDAGES/DRESSINGS) ×6 IMPLANT
GLOVE BIO SURGEON STRL SZ7 (GLOVE) ×3 IMPLANT
GLOVE BIO SURGEON STRL SZ8 (GLOVE) ×3 IMPLANT
GLOVE INDICATOR 7.5 STRL GRN (GLOVE) ×3 IMPLANT
GOWN STRL REUS W/ TWL LRG LVL3 (GOWN DISPOSABLE) ×5 IMPLANT
GOWN STRL REUS W/ TWL XL LVL3 (GOWN DISPOSABLE) ×1 IMPLANT
GOWN STRL REUS W/TWL LRG LVL3 (GOWN DISPOSABLE) ×10
GOWN STRL REUS W/TWL XL LVL3 (GOWN DISPOSABLE) ×2
NS IRRIG 1000ML POUR BTL (IV SOLUTION) ×6 IMPLANT
PACK C SECTION AR (MISCELLANEOUS) ×6 IMPLANT
PAD OB MATERNITY 4.3X12.25 (PERSONAL CARE ITEMS) ×6 IMPLANT
PAD PREP 24X41 OB/GYN DISP (PERSONAL CARE ITEMS) ×6 IMPLANT
PENCIL SMOKE ULTRAEVAC 22 CON (MISCELLANEOUS) ×6 IMPLANT
RETRACTOR TRAXI PANNICULUS (MISCELLANEOUS) IMPLANT
STAPLER INSORB 30 2030 C-SECTI (MISCELLANEOUS) IMPLANT
STRAP SAFETY 5IN WIDE (MISCELLANEOUS) ×3 IMPLANT
STRIP CLOSURE SKIN 1/2X4 (GAUZE/BANDAGES/DRESSINGS) ×2 IMPLANT
SUCT VACUUM KIWI BELL (SUCTIONS) IMPLANT
SUT CHROMIC 1 CTX 36 (SUTURE) IMPLANT
SUT MNCRL AB 4-0 PS2 18 (SUTURE) ×3 IMPLANT
SUT PDS AB 1 TP1 96 (SUTURE) ×3 IMPLANT
SUT PLAIN GUT 0 (SUTURE) IMPLANT
SUT VIC AB 0 CT1 36 (SUTURE) ×6 IMPLANT
SUT VIC AB 0 CTX 36 (SUTURE)
SUT VIC AB 0 CTX36XBRD ANBCTRL (SUTURE) IMPLANT
SUT VIC AB 1 CT1 36 (SUTURE) ×6 IMPLANT
SUT VIC AB 2-0 CT1 36 (SUTURE) IMPLANT
SUT VIC AB 2-0 SH 27 (SUTURE)
SUT VIC AB 2-0 SH 27XBRD (SUTURE) IMPLANT
TRAXI PANNICULUS RETRACTOR (MISCELLANEOUS)

## 2019-11-06 NOTE — Anesthesia Preprocedure Evaluation (Signed)
Anesthesia Evaluation  Patient identified by MRN, date of birth, ID band Patient awake    Reviewed: Allergy & Precautions, H&P , NPO status , Patient's Chart, lab work & pertinent test results  Airway Mallampati: II  TM Distance: >3 FB Neck ROM: full    Dental  (+) Chipped   Pulmonary neg pulmonary ROS,           Cardiovascular (-) hypertensionnegative cardio ROS       Neuro/Psych  Headaches, PSYCHIATRIC DISORDERS Anxiety Bipolar Disorder    GI/Hepatic negative GI ROS, Neg liver ROS,   Endo/Other  negative endocrine ROSneg diabetes  Renal/GU      Musculoskeletal   Abdominal   Peds  Hematology negative hematology ROS (+)   Anesthesia Other Findings Stat c/s for prolapsed cord  Past Medical History: No date: Anemia No date: Anxiety No date: ASCUS favor benign No date: Bipolar 1 disorder (HCC) No date: History of preterm delivery     Comment:  with spontaneous labor in 2010 and 2015 No date: History of preterm premature rupture of membranes (PPROM)     Comment:  2010 at 32 weeks No date: HPV in female No date: HSV infection No date: Hypotension No date: Scoliosis  Past Surgical History: 08/31/2020: CERVICAL CERCLAGE No date: INDUCED ABORTION     Reproductive/Obstetrics (+) Pregnancy                             Anesthesia Physical Anesthesia Plan  ASA: III and emergent  Anesthesia Plan: General ETT   Post-op Pain Management:    Induction:   PONV Risk Score and Plan: Ondansetron, Dexamethasone, Midazolam and Treatment may vary due to age or medical condition  Airway Management Planned:   Additional Equipment:   Intra-op Plan:   Post-operative Plan:   Informed Consent: I have reviewed the patients History and Physical, chart, labs and discussed the procedure including the risks, benefits and alternatives for the proposed anesthesia with the patient or authorized  representative who has indicated his/her understanding and acceptance.     Dental Advisory Given  Plan Discussed with: Anesthesiologist, CRNA and Surgeon  Anesthesia Plan Comments:         Anesthesia Quick Evaluation

## 2019-11-06 NOTE — H&P (Signed)
Obstetric H&P   Chief Complaint: "Something hanging out of vagina"  Prenatal Care Provider: [facility]  History of Present Illness: 34 y.o. [id number] [redacted]w[redacted]d by 01/31/2020, by Ultrasound presenting to L&D after leaving against medical advice from [facility] where she was being monitored for PPROM, cerclage (which was removed although some retained as both ends of the knot were cut) history of HSV2, scoliosis, and Bipolar 1.  The patient states she has not noted any contractions but this morning saw something white hanging out of her vagina.  Most recent growth at [facility] 2/15 was 1021g or 01%UUV, cephalic, AFI of 2.5DG, small pericardial effusion and L renal pyelectasis.  The patient received BMZ 2/15 and 2/16 and completed a course of latency antibiotics.  She was placed on magnesium during that admission for neuoprotection as well.   Pregravid weight 88 kg Total Weight Gain 0.907 kg  Pregnancy #7 Problems (from 06/15/19 to present)    Problem Noted Resolved   Cervical cerclage suture present, antepartum 10/03/2019 by Malachy Mood, MD No   Short cervix 08/22/2019 by Manfred Shirts, MD No   Overview Addendum 10/03/2019  1:09 PM by Malachy Mood, MD    Cervix 2.3cm, closed, soft. Options for cerclage v. Progesterone reviewed. Will begin vaginal progesterone and follow up with anatomy scan, if shorter or dilating, she will consider cerclage placement.  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, antepartum 07/28/2019 by Homero Fellers, MD No   Overview Signed 07/28/2019 10:47 PM by Homero Fellers, MD    [ ]  Initiate valtrex at 36 weeks      Hyperemesis affecting pregnancy, antepartum 06/15/2019  by Rexene Agent, CNM No   Supervision of high-risk pregnancy 05/24/2017 by Gretchen Short, CMA No   Overview Addendum 10/20/2019  8:24 AM by Dalia Heading, [facility] Prenatal Labs  Dating  6 wk [place] Blood type: A/Positive/-- (09/30 1116)   Genetic Screen NIPT:normal XX Antibody:Negative (09/30 1116)  Anatomic [place] Normal Rubella: 12.10 (09/30 1116)  GTT Early:               Third trimester:  RPR: Non Reactive (09/30 1116)   Flu vaccine  06/16/2019 HBsAg: Negative (09/30 1116)   TDaP vaccine                                               Rhogam:not needed HIV: Non Reactive (09/30 1116)   Baby Food                                               UYQ:[id number] (For PCN allergy, check sensitivities)  Contraception  Pap: 2020 NIL HPV negative, trichomonas positive  Circumcision    Pediatrician  CF:  Support Person  SMA  Prenatal Classes  Hgb electrophoresis:             Review of Systems: 10  point review of systems negative unless otherwise noted in HPI  Past Medical History: Past Medical History:  Diagnosis Date  . Anemia   . Anxiety   . ASCUS favor benign   . Bipolar 1 disorder ([facility])   . History of preterm delivery    with spontaneous labor in 2010 and 2015  . History of preterm premature rupture of membranes (PPROM)    2010 at 32 weeks  . HPV in female   . HSV infection   . Hypotension   . Scoliosis     Past Surgical History: Past Surgical History:  Procedure Laterality Date  . CERVICAL CERCLAGE  08/31/2020  . INDUCED ABORTION      Past Obstetric History: # 1 - Date: 2002, Sex: None, Weight: None, GA: None, Delivery: Therapeutic Abortion, Apgar1: None, Apgar5: None, Living: Fetal Demise, Birth Comments: None  # 2 - Date: 02/24/04, Sex: Female, Weight: 2155 g, GA: [redacted]w[redacted]d, Delivery: Vaginal, Spontaneous, Apgar1: None, Apgar5: None, Living: Living, Birth Comments: None  # 3 - Date: 2009, Sex: None, Weight: None, GA: [redacted]w[redacted]d, Delivery: Spontaneous  Abortion, Apgar1: None, Apgar5: None, Living: None, Birth Comments: None  # 4 - Date: 06/28/09, Sex: Female, Weight: 3062 g, GA: [redacted]w[redacted]d, Delivery: Vaginal, Spontaneous, Apgar1: None, Apgar5: None, Living: Living, Birth Comments: took 17P  # 5 - Date: 03/14/14, Sex: Female, Weight: 3515 g, GA: [redacted]w[redacted]d, Delivery: Vaginal, Spontaneous, Apgar1: None, Apgar5: None, Living: Living, Birth Comments: took 17P  # 6 - Date: 2018, Sex: None, Weight: None, GA: None, Delivery: Spontaneous Abortion, Apgar1: None, Apgar5: None, Living: None, Birth Comments: None  # 7 - Date: None, Sex: None, Weight: None, GA: None, Delivery: None, Apgar1: None, Apgar5: None, Living: None, Birth Comments: None   Past Gynecologic History:  Family History: Family History  Problem Relation Age of Onset  . Osteoarthritis Mother   . Asthma Mother   . Hypertension Mother   . Osteoarthritis Father   . Hypertension Father   . Osteoarthritis Maternal Grandmother   . Asthma Maternal Grandmother   . Cancer Maternal Grandmother        breast cancer  . Diabetes Maternal Grandmother   . Heart failure Maternal Grandmother   . Hyperlipidemia Maternal Grandmother   . Hypertension Maternal Grandmother   . Migraines Maternal Grandmother   . Thyroid disease Maternal Grandmother   . Osteoarthritis Paternal Grandmother   . Asthma Paternal Grandmother   . Cancer Paternal Grandmother        stomach  . Diabetes Paternal Grandmother   . Heart failure Paternal Grandmother   . Hyperlipidemia Paternal Grandmother   . Hypertension Paternal Grandmother   . Breast cancer Paternal Aunt   . Hypertension Sister     Social History: Social History   Socioeconomic History  . Marital status: Single    Spouse name: Not on file  . Number of children: Not on file  . Years of education: Not on file  . Highest education level: Not on file  Occupational History  . Not on file  Tobacco Use  . Smoking status: Never Smoker  . Smokeless tobacco:  Never Used  Substance and Sexual Activity  . Alcohol use: No  . Drug use: No  . Sexual activity: Not Currently    Birth control/protection: Other-see comments  Other Topics Concern  . Not on file  Social History Narrative  . Not on file   Social Determinants of Health   Financial Resource Strain:   . Difficulty of Paying Living  Expenses: Not on file  Food Insecurity:   . Worried About Programme researcher, broadcasting/film/video in the Last Year: Not on file  . Ran Out of Food in the Last Year: Not on file  Transportation Needs:   . Lack of Transportation (Medical): Not on file  . Lack of Transportation (Non-Medical): Not on file  Physical Activity:   . Days of Exercise per Week: Not on file  . Minutes of Exercise per Session: Not on file  Stress:   . Feeling of Stress : Not on file  Social Connections:   . Frequency of Communication with Friends and Family: Not on file  . Frequency of Social Gatherings with Friends and Family: Not on file  . Attends Religious Services: Not on file  . Active Member of Clubs or Organizations: Not on file  . Attends Banker Meetings: Not on file  . Marital Status: Not on file  Intimate Partner Violence:   . Fear of Current or Ex-Partner: Not on file  . Emotionally Abused: Not on file  . Physically Abused: Not on file  . Sexually Abused: Not on file    Medications: Prior to Admission medications   Medication Sig Start Date End Date Taking? Authorizing Provider  EPINEPHrine 0.3 mg/0.3 mL IJ SOAJ injection Inject into the muscle. 07/25/19   [provider]  omeprazole (PRILOSEC) 20 MG capsule Take 1 capsule (20 mg total) by mouth daily. 10/11/19   Vena Austria, MD  Prenatal Vit-Fe Fumarate-FA (PRENATAL MULTIVITAMIN) TABS tablet Take 1 tablet by mouth daily at 12 noon. Pt takes these as gummies    [provider]  albuterol (PROVENTIL HFA;VENTOLIN HFA) 108 (90 Base) MCG/ACT inhaler Inhale 2 puffs into the lungs every 6 (six) hours as  needed for wheezing or shortness of breath. 10/10/17 04/03/19  Sherrie Mustache Roselyn Bering, PA-C    Allergies: Allergies  Allergen Reactions  . Amoxicillin Hives  . Bee Venom Swelling and Shortness Of Breath  . Coconut Oil Shortness Of Breath and Swelling  . Mushroom Extract Complex Swelling  . Other Anaphylaxis and Swelling    Coconut Coconut    Physical Exam: Vitals: Blood pressure 110/66, pulse 73, temperature (!) 96.2 F (35.7 C), resp. rate 19, SpO2 100 %.   FHT: 150's, moderate variability, no accels, occasional variable (short strip only a few minutes prior to proceeding to OR)  General: NAD HEENT: normocephalic, anicteric Pulmonary: No increased work of breathing Cardiovascular: RRR, distal pulses 2+ Abdomen: Gravid, non-tender Genitourinary: prolapsed cord noted at introitus Extremities: no edema, erythema, or tenderness Neurologic: Grossly intact Psychiatric: mood appropriate, affect full  Labs: No results found for this or any previous visit (from the past 24 hour(s)).  Assessment: 34 y.o. [id number] [redacted]w[redacted]d by 01/31/2020, by Ultrasound presenting in setting of PPROM and umbilical cord prolapse  Plan: 1)Nature of findings discussed and patient immediately taken back to OR for stat cesarean section  2) Fetus - cat I  3) PNL - Blood type A/Positive/-- (09/30 1116) / Anti-bodyscreen Negative (09/30 1116) / Rubella 12.10 (09/30 1116)  / RPR Non Reactive (09/30 1116) / HBsAg Negative (09/30 1116) / HIV Non Reactive (09/30 1116) / GBS --/NEGATIVE (02/14 0730)  4) Immunization History -  Immunization History  Administered Date(s) Administered  . Influenza,inj,Quad PF,6+ Mos 06/16/2019  . PPD Test 10/04/2019    5) Disposition - pending delivery  Vena Austria, MD, [facility], [facility] 11/06/2019, 11:25 AM

## 2019-11-06 NOTE — Plan of Care (Signed)
Pt. Oriented to room. Assessment WNL. Peri care provided. Infant is in NICU at [facility]. Mom states infant is doing well per hospitals report. Mom encouraged to increase po intake and she v/o and has tolerated reg. Diet for dinner. Denies c/o at this time.

## 2019-11-06 NOTE — Anesthesia Procedure Notes (Signed)
Procedure Name: Intubation Date/Time: 11/06/2019 10:13 AM Performed by: Karleen Hampshire, MD Pre-anesthesia Checklist: Patient identified, Patient being monitored, Timeout performed, Emergency Drugs available and Suction available Patient Re-evaluated:Patient Re-evaluated prior to induction Oxygen Delivery Method: Circle system utilized Preoxygenation: Pre-oxygenation with 100% oxygen Induction Type: IV induction and Rapid sequence Laryngoscope Size: 3 and McGraph Grade View: Grade I Tube type: Oral Tube size: 6.5 mm Number of attempts: 1 Airway Equipment and Method: Stylet Placement Confirmation: ETT inserted through vocal cords under direct vision,  positive ETCO2 and breath sounds checked- equal and bilateral Secured at: 21 cm Tube secured with: Tape Dental Injury: Teeth and Oropharynx as per pre-operative assessment

## 2019-11-06 NOTE — Transfer of Care (Signed)
Immediate Anesthesia Transfer of Care Note  Patient: Lisa Rowland  Procedure(s) Performed: CESAREAN SECTION (N/A )  Patient Location: PACU  Anesthesia Type:General  Level of Consciousness: awake  Airway & Oxygen Therapy: Patient Spontanous Breathing  Post-op Assessment: Report given to RN  Post vital signs: Reviewed  Last Vitals:  Vitals Value Taken Time  BP 110/66 11/06/19 1119  Temp 35.7 C 11/06/19 1117  Pulse 71 11/06/19 1124  Resp 13 11/06/19 1124  SpO2 100 % 11/06/19 1124  Vitals shown include unvalidated device data.  Last Pain:  Vitals:   11/06/19 0957  TempSrc: Oral         Complications: No apparent anesthesia complications

## 2019-11-06 NOTE — Op Note (Signed)
Preoperative Diagnosis: 1) 34 y.o. [id number] at [redacted]w[redacted]d with umbilical cord prolapse 2) Preterm prelabor rupture of membranes 3) History of HSV 2 4) History of preterm labor with cerclage removed earlier this week secondary to #2  Postoperative Diagnosis: 1) 34 y.o. [id number] at [redacted]w[redacted]d with umbilical cord prolapse 2) Preterm prelabor rupture of membranes 3) History of HSV 2 4) History of preterm labor with cerclage removed earlier this week secondary to #2  Operation Performed: Primary low transverse C-section via pfannenstiel skin incision  Indication: Umbilical cord prolapse  Anesthesia: GETA  Primary Surgeon: Malachy Mood, MD  Assistant: Linda Hedges, CNM this surgery required a high level surgical assistant with none other readilty available  Preoperative Antibiotics: 2g ancef and 500mg  of azithromycin  Estimated Blood Loss:QBL 1015 mL  IV Fluids: 1L of crystaloid  Urine Output:: pending  Drains or Tubes: Foley to gravity drainage, ON-Q catheter system  Implants: none  Specimens Removed: none  Complications: none  Intraoperative Findings:  Normal tubes ovaries and uterus.  Delivery resulted in the birth of a liveborn female, APGAR (1 MIN): 5   APGAR (5 MINS): 8, weight pending  Patient Condition: stable  Procedure in Detail:  Patient was taken to the operating room, positioned in the supine position, prepped and draped in the  Usual sterile fashion.  Prior to proceeding with the case a time out was performed.  General endotracheal anesthesia was administered.  Utilizing the scalpel a pfannenstiel skin incision was made 2cm above the pubic symphysis and carried down sharply to the the level of the rectus fascia.  The fascia was incised in the midline using the scalpel and then extended using manual traction.    The midline was identified, the peritoneum was entered bluntly and expanded using manual tractions.  The uterus was noted to be in a none rotated position.  Next  the bladder blade was placed retracting the bladder caudally.  A bladder flap was not created.  A low transverse incision was scored on the lower uterine segment.  The hysterotomy was entered bluntly using the operators finger.  The hysterotomy incision was extended using manual traction.  The operators hand was placed within the hysterotomy position noting the fetus to be within the OA position.  The vertex was grasped, flexed, brought to the incision, and delivered a traumatically using fundal pressure.  The remainder of the body delivered with ease.  The infant was suctioned, cord was clamped and cut before handing off to the awaiting neonatologist.  The placenta was delivered using manual extraction.  The uterus was exteriorized, wiped clean of clots and debris using two moist laps.  The hysterotomy was closed using a two layer closure of 0 Vicryl, with the first being a running locked, the second a vertical imbricating.  The uterus was returned to the abdomen.  The peritoneal gutters were wiped clean of clots and debris using two moist laps.  The hysterotomy incision was re-inspected noted to be hemostatic. The rectus muscles were inspected noted to be hemostatic.  The superior border of the rectus fascia was grasped with a Kocher clamp.  The ON-Q trocars were then placed 4cm above the superior border of the incision and tunneled subfascially.  The introducers were removed and the catheters were threaded through the sleeves after which the sleeves were removed.  The fascia was closed using a looped #1 PDS in a running fashion taking 1cm by 1cm bites.  The subcutaneous tissue was irrigated using warm saline, hemostasis achieved using  the bovie.  The subcutaneous dead space was less than 3cm and was not closed.  The skin was closed using 4-0 Monocryl in a subcuticular fashion.  Sponge needle and instrument counts were corrects times two.  The patient tolerated the procedure well and was taken to the recovery  room in stable condition.

## 2019-11-06 NOTE — Lactation Note (Signed)
This note was copied from a baby's chart. [name] OEUMP'N Date: 11/06/2019   On admission, mom had said feeding choice was breast/bottle. Spoke with mom twice about initiation of pumping for baby that was transferred to [facility] born at 27.5 weeks.  Mom came to [facility] with prolapsed cord and baby was delivered at 10:14 am.  Discussed benefits of pumping for premature infant to supply breast milk.  The first time I spoke with mom, she reports probably was only going to bottle feed, but would think about it stating she just needed to get herself together right now.  Mom has 2 other special needs children at home which is why she checked herself out AMA from [facility] after removing the cerclage at her request 6 days ago.  Mom has history of bipolar Type 2.  When went in room second time to find out mom's wishes after she had time to think about it, she still declines to pump stating she is going to stick with her choice to bottlefeed formula.  Lactation name and number has been written on white board and encouraged to call if she changes her mind.        Maternal Data    Feeding    LATCH Score                   Interventions    Lactation Tools Discussed/Used     Consult Status      Lisa Rowland 11/06/2019, 4:20 PM

## 2019-11-06 NOTE — Discharge Summary (Signed)
Obstetric Discharge Summary Reason for Admission: Umbilical cord prolapse Prenatal Procedures: none Intrapartum Procedures: cesarean: low cervical, transverse Postpartum Procedures: none Complications-Operative and Postpartum: none Hemoglobin  Date Value Ref Range Status  11/07/2019 9.3 (L) 12.0 - 15.0 g/dL Final  34/74/2595 63.8 11.1 - 15.9 g/dL Final   HCT  Date Value Ref Range Status  11/07/2019 28.7 (L) 36.0 - 46.0 % Final   Hematocrit  Date Value Ref Range Status  06/15/2019 39.3 34.0 - 46.6 % Final    Physical Exam:  General: alert, cooperative and no distress Lochia: appropriate Uterine Fundus: firm Incision: healing well, no significant drainage, no dehiscence, no significant erythema DVT Evaluation: No evidence of DVT seen on physical exam. No cords or calf tenderness. No significant calf/ankle edema.  Discharge Diagnoses: Preterm labor and rupture of membranes, umbilical cord prolapse, status post cesarean delivery, acute blood loss anemia secondary to surgical blood loss  Discharge Information: Date: 11/08/2019 Activity: pelvic rest Diet: routine Allergies as of 11/08/2019      Reactions   Amoxicillin Hives   Bee Venom Swelling, Shortness Of Breath   Coconut Oil Shortness Of Breath, Swelling   Mushroom Extract Complex Swelling   Other Anaphylaxis, Swelling   Coconut Coconut      Medication List    STOP taking these medications   EPINEPHrine 0.3 mg/0.3 mL Soaj injection Commonly known as: EPI-PEN     TAKE these medications   ibuprofen 800 MG tablet Commonly known as: ADVIL Take 1 tablet (800 mg total) by mouth every 8 (eight) hours.   omeprazole 20 MG capsule Commonly known as: PRILOSEC Take 1 capsule (20 mg total) by mouth daily.   oxyCODONE-acetaminophen 5-325 MG tablet Commonly known as: PERCOCET/ROXICET Take 1 tablet by mouth every 4 (four) hours as needed (breakthrough pain).   prenatal multivitamin Tabs tablet Take 1 tablet by mouth  daily at 12 noon. Pt takes these as gummies   simethicone 80 MG chewable tablet Commonly known as: MYLICON Chew 1 tablet (80 mg total) by mouth 3 (three) times daily after meals.            Discharge Care Instructions  (From admission, onward)         Start     Ordered   11/08/19 0000  Discharge wound care:    Comments: Perform wound care instructions   11/08/19 1233          Condition: stable Discharge to: home Follow-up Information    Vena Austria, MD Follow up on 11/18/2019.   Specialty: Obstetrics and Gynecology Why: For wound re-check on 3/5 @ 11:30 am. Contact information: [address] [place] [address] [phone]           Newborn Data: Live born female  Birth Weight:   APGAR: 5, 8  Newborn Delivery   Birth date/time: 11/06/2019 10:14:00 Delivery type:       Disposition of newborn: transferred to [facility] due to early gestational age.  Thomasene Mohair, MD 11/08/2019, 12:35 PM

## 2019-11-06 NOTE — Progress Notes (Addendum)
Patient presented to to L& D complaints of " something white coming out" she left [facility] yesterday. She ruptured on 2/14. Dr Bonney Aid entered room at [phone] and patient was found to have a cord prolapse. Stat c-section was immediately called STAT. Patient entered OR at 45

## 2019-11-07 ENCOUNTER — Other Ambulatory Visit: Payer: Self-pay

## 2019-11-07 LAB — CBC
HCT: 28.7 % — ABNORMAL LOW (ref 36.0–46.0)
Hemoglobin: 9.3 g/dL — ABNORMAL LOW (ref 12.0–15.0)
MCH: 28.6 pg (ref 26.0–34.0)
MCHC: 32.4 g/dL (ref 30.0–36.0)
MCV: 88.3 fL (ref 80.0–100.0)
Platelets: 199 10*3/uL (ref 150–400)
RBC: 3.25 MIL/uL — ABNORMAL LOW (ref 3.87–5.11)
RDW: 13.2 % (ref 11.5–15.5)
WBC: 19.3 10*3/uL — ABNORMAL HIGH (ref 4.0–10.5)
nRBC: 0 % (ref 0.0–0.2)

## 2019-11-07 LAB — RPR: RPR Ser Ql: NONREACTIVE

## 2019-11-07 NOTE — Progress Notes (Signed)
   11/07/19 1045  Clinical Encounter Type  Visited With Patient  Visit Type Initial  Referral From Chaplain  Consult/Referral To Chaplain  While doing rounds Chaplain visited with patient. Chaplain gave patient a pink onesie and towel set.  Patient explained her baby was born at 44 weeks and was transferred to [facility]. Patient said Blanch Media is doing fine, doctor said that she is feisty. Patient is comfortable with Blanch Media being at [facility] because her other children received good care there. Doctor at [facility] is keeping patient abreast to Baby Zariyah's progress. Patient said she is feeling pretty good, has felt pain worse than this. Patient believes she will be discharged tomorrow. She has two other children she needs to get home to, 3 yr. old Malakai, who has seizures and 34 yr old Dermale, who is Theatre stage manager. While Chaplain was visiting, patient received a call and Chaplain left and  came back. Chaplain offered pastoral presence, empathy, and prayer.

## 2019-11-07 NOTE — Progress Notes (Signed)
Obstetric Postpartum/PostOperative Daily Progress Note Subjective:  34 y.o. [id number] post-operative day # 1 status post primary cesarean delivery.  She is ambulating, is tolerating po, is voiding spontaneously.  Her pain is well controlled on PO pain medications. Her lochia is less than menses. She has had updates on baby's progress. She plans to be discharged as soon as she can so she can visit the baby.    Medications SCHEDULED MEDICATIONS  . acetaminophen  1,000 mg Oral Q6H  . ibuprofen  800 mg Oral Q8H  . prenatal multivitamin  1 tablet Oral Q1200  . senna-docusate  2 tablet Oral Q24H  . simethicone  80 mg Oral TID PC  . simethicone  80 mg Oral Q24H  . Tdap  0.5 mL Intramuscular Once    MEDICATION INFUSIONS  . bupivacaine 0.25 % ON-Q pump DUAL CATH 400 mL    . lactated ringers      PRN MEDICATIONS  witch hazel-glycerin **AND** dibucaine, diphenhydrAMINE, menthol-cetylpyridinium, oxyCODONE-acetaminophen, simethicone    Objective:   Vitals:   11/06/19 1913 11/06/19 2309 11/07/19 0455 11/07/19 0837  BP: 121/70 107/61 102/67 108/64  Pulse: 85 70 71 74  Resp: 18 18 18 18   Temp: 98.5 F (36.9 C) 98.5 F (36.9 C) 98.3 F (36.8 C) 98.3 F (36.8 C)  TempSrc: Oral Oral Oral Oral  SpO2: 99% 100% 100% 99%    Current Vital Signs 24h Vital Sign Ranges  T 98.3 F (36.8 C) Temp  Avg: 98.1 F (36.7 C)  Min: 96.2 F (35.7 C)  Max: 100 F (37.8 C)  BP 108/64 BP  Min: 102/67  Max: 121/70  HR 74 Pulse  Avg: 73.6  Min: 70  Max: 85  RR 18 Resp  Avg: 14.2  Min: 10  Max: 19  SaO2 99 % Room Air SpO2  Avg: 99.1 %  Min: 97 %  Max: 100 %       24 Hour I/O Current Shift I/O  Time Ins Outs 02/21 0701 - 02/22 0700 In: 3227.7 [P.O.:720; I.V.:1257] Out: 2510 [Urine:1495] No intake/output data recorded.  General: NAD Pulmonary: no increased work of breathing Abdomen: non-distended, non-tender, fundus firm at level of umbilicus Inc: Clean/dry/intact, On Q pump intact Extremities: no edema,  no erythema, no tenderness  Labs:  Recent Labs  Lab 11/06/19 1303 11/07/19 0507  WBC 19.5* 19.3*  HGB 10.8* 9.3*  HCT 33.7* 28.7*  PLT 192 199     Assessment:   34 y.o. 32 postoperative day # 1 status post primary cesarean section  Plan:  1) Acute blood loss anemia - hemodynamically stable and asymptomatic - po ferrous sulfate  2) A POS / Rubella 12.10 (09/30 1116)/ Varicella Unknown  3) TDAP status : needs prior to discharge  4) Formula feeding/Contraception = interval bilateral tubal ligation/needs to sign consent at 1 week postpartum visit and plan surgery date  5) Disposition: continue current care   01-15-2006, CNM 11/07/2019 10:10 AM

## 2019-11-07 NOTE — Anesthesia Postprocedure Evaluation (Signed)
Anesthesia Post Note  Patient: Lisa Rowland  Procedure(s) Performed: CESAREAN SECTION (N/A )  Patient location during evaluation: PACU Anesthesia Type: General Level of consciousness: awake and alert Pain management: pain level controlled Vital Signs Assessment: post-procedure vital signs reviewed and stable Respiratory status: spontaneous breathing, nonlabored ventilation, respiratory function stable and patient connected to nasal cannula oxygen Cardiovascular status: blood pressure returned to baseline and stable Postop Assessment: no apparent nausea or vomiting Anesthetic complications: no     Last Vitals:  Vitals:   11/06/19 1913 11/06/19 2309  BP: 121/70 107/61  Pulse: 85 70  Resp: 18 18  Temp: 36.9 C 36.9 C  SpO2: 99% 100%    Last Pain:  Vitals:   11/07/19 0330  TempSrc:   PainSc: Asleep                 Karleen Hampshire

## 2019-11-07 NOTE — Discharge Instructions (Signed)
Discharge Instructions:   Follow-up Appointment: Call ASAP and schedule a wound re-check in 1 week with Dr. Bonney Aid!  If there are any new medications, they have been ordered and will be available for pickup at the listed pharmacy on your way home from the hospital.   Call office if you have any of the following: headache, visual changes, fever >101.0 F, chills, shortness of breath, breast concerns, excessive vaginal bleeding, incision drainage or problems, leg pain or redness, depression or any other concerns. If you have vaginal discharge with an odor, let your doctor know.   It is normal to bleed for up to 6 weeks. You should not soak through more than 1 pad in 1 hour. If you have a blood clot larger than your fist with continued bleeding, call your doctor.   After a c-section, you should expect a small amount of blood or clear fluid coming from the incision and abdominal cramping/soreness. Inspect your incision site daily. Stand in front of a mirror to look for any redness, incision opening, or discolored/odorness drainage. Take a shower daily and continue good hygiene. Use own towel and washcloth (do not share). Make sure your sheets on your bed are clean. No pets sleeping around your incision site. Dressing will be removed at your postpartum visit. If the dressing does become wet or soiled underneath, it is okay to remove it.   On-Q pump: You will remove on day 5 after insertion or if the ball becomes flat before day 5. You will remove on: February 26th!   Activity: Do not lift > 10 lbs for 6 weeks (do not lift anything heavier than your baby).  No intercourse, tampons, swimming pools, hot tubs, baths (only showers) for 6 weeks.  No driving for 1-2 weeks. Continue prenatal vitamin, especially if breastfeeding. Increase calories and fluids (water) while breastfeeding.   Your milk will come in, in the next couple of days (right now it is colostrum). You may have a slight fever when your milk  comes in, but it should go away on its own.  If it does not, and rises above 101 F please call the doctor. You will also feel achy and your breasts will be firm. They will also start to leak. If you are breastfeeding, continue as you have been and you can pump/express milk for comfort.   If you have too much milk, your breasts can become engorged, which could lead to mastitis. This is an infection of the milk ducts. It can be very painful and you will need to notify your doctor to obtain a prescription for antibiotics. You can also treat it with a shower or hot/cold compress.   For concerns about your baby, please call your pediatrician.  For breastfeeding concerns, the lactation consultant can be reached at [phone].   Postpartum blues (feelings of happy one minute and sad another minute) are normal for the first few weeks but if it gets worse let your doctor know.   Congratulations! We enjoyed caring for you and your new bundle of joy!

## 2019-11-08 ENCOUNTER — Other Ambulatory Visit: Payer: Medicaid Other

## 2019-11-08 ENCOUNTER — Encounter: Payer: Medicaid Other | Admitting: Obstetrics and Gynecology

## 2019-11-08 DIAGNOSIS — Z98891 History of uterine scar from previous surgery: Secondary | ICD-10-CM

## 2019-11-08 MED ORDER — OXYCODONE-ACETAMINOPHEN 5-325 MG PO TABS: 1.0000 | ORAL_TABLET | ORAL | 0 refills | Status: DC | PRN

## 2019-11-08 MED ORDER — SIMETHICONE 80 MG PO CHEW: 80.0000 mg | CHEWABLE_TABLET | Freq: Three times a day (TID) | ORAL | 0 refills | Status: DC

## 2019-11-08 MED ORDER — IBUPROFEN 800 MG PO TABS: 800.0000 mg | ORAL_TABLET | Freq: Three times a day (TID) | ORAL | 0 refills | Status: DC

## 2019-11-08 NOTE — Progress Notes (Signed)
Nurse and nurse tech entered pt's room at 2350 to check vital signs and offer medications. Pt was irritated about [place] waking her up. We apologized and told her we needed to check her vital signs every 8 hours at the least. She agreed and apologized. She requested to not be woken up again until after 0700.

## 2019-11-08 NOTE — Progress Notes (Signed)
Pt discharged home.  Discharge instructions, prescriptions and follow up appointment given to and reviewed with pt. Pt verbalized understanding. Escorted out by auxillary.

## 2019-11-15 ENCOUNTER — Telehealth: Payer: Self-pay

## 2019-11-15 ENCOUNTER — Other Ambulatory Visit: Payer: Self-pay | Admitting: Obstetrics and Gynecology

## 2019-11-15 DIAGNOSIS — O0993 Supervision of high risk pregnancy, unspecified, third trimester: Secondary | ICD-10-CM

## 2019-11-15 DIAGNOSIS — O42913 Preterm premature rupture of membranes, unspecified as to length of time between rupture and onset of labor, third trimester: Secondary | ICD-10-CM

## 2019-11-15 MED ORDER — LIDOCAINE-PRILOCAINE 2.5-2.5 % EX CREA: 1.0000 "application " | TOPICAL_CREAM | CUTANEOUS | 0 refills | Status: DC | PRN

## 2019-11-15 MED ORDER — OXYCODONE-ACETAMINOPHEN 5-325 MG PO TABS: 1.0000 | ORAL_TABLET | ORAL | 0 refills | Status: DC | PRN

## 2019-11-15 NOTE — Telephone Encounter (Signed)
Patient reports sharpe pain in her side where she had her c-section. Requesting call back #[phone]

## 2019-11-15 NOTE — Telephone Encounter (Signed)
Spoke w/patient. She is taking her Percocet and Ibuprofen as prescribed. She was doing just fine until today. She is still using a pillow to support incision site and dressing is still on. She reports burning pain at incision site and in her abdomen on the right side when she gets up or moves in any way. She can hardly stand to walk it hurts so bad. Advised AMS in OR/CALL. She would like another provider in office to review for advice or see her if possible.

## 2019-11-15 NOTE — Telephone Encounter (Signed)
It sounds like she will have to wait and be seen tomorrow. I can call her but it sounds like she should be seen by a doc. Maybe AMS has called her by now.

## 2019-11-18 ENCOUNTER — Ambulatory Visit (INDEPENDENT_AMBULATORY_CARE_PROVIDER_SITE_OTHER): Payer: Medicaid Other | Admitting: Obstetrics and Gynecology

## 2019-11-18 ENCOUNTER — Other Ambulatory Visit: Payer: Self-pay

## 2019-11-18 ENCOUNTER — Encounter: Payer: Self-pay | Admitting: Obstetrics and Gynecology

## 2019-11-18 VITALS — BP 110/70 | Wt 186.0 lb

## 2019-11-18 DIAGNOSIS — Z4889 Encounter for other specified surgical aftercare: Secondary | ICD-10-CM

## 2019-11-18 NOTE — Progress Notes (Signed)
      Postoperative Follow-up Patient presents post op from emergency C-section 1weeks ago for umbilical cord prolapse.  Subjective: Patient reports marked improvement in her preop symptoms. Eating a regular diet without difficulty. Pain well controlled percocet and ibuprofen  Activity: normal activities of daily living.  Objective: Blood pressure 110/70, weight 186 lb (84.4 kg), unknown if currently breastfeeding.  General: NAD Pulmonary: no increased work of breathing Abdomen: soft, non-tender, non-distended, incision(s) D/C/I Extremities: no edema Neurologic: normal gait    Admission on 11/06/2019, Discharged on 11/08/2019  Component Date Value Ref Range Status  . WBC 11/06/2019 19.5* 4.0 - 10.5 K/uL Final  . RBC 11/06/2019 3.80* 3.87 - 5.11 MIL/uL Final  . Hemoglobin 11/06/2019 10.8* 12.0 - 15.0 g/dL Final  . HCT 97/10/6376 33.7* 36.0 - 46.0 % Final  . MCV 11/06/2019 88.7  80.0 - 100.0 fL Final  . MCH 11/06/2019 28.4  26.0 - 34.0 pg Final  . MCHC 11/06/2019 32.0  30.0 - 36.0 g/dL Final  . RDW 58/85/0277 13.4  11.5 - 15.5 % Final  . Platelets 11/06/2019 192  150 - 400 K/uL Final  . nRBC 11/06/2019 0.0  0.0 - 0.2 % Final   Performed at [facility], [address], [place], [place] [address]  . RPR Ser Ql 11/06/2019 NON REACTIVE  NON REACTIVE Final   Performed at [facility], [address] [place], [place] [address]  . ABO/RH(D) 11/06/2019 A POS   Final  . Antibody Screen 11/06/2019 NEG   Final  . Sample Expiration 11/06/2019    Final                   Value:11/09/2019,2359 Performed at [facility], [address], [place], [place] [address]   . WBC 11/07/2019 19.3* 4.0 - 10.5 K/uL Final  . RBC 11/07/2019 3.25* 3.87 - 5.11 MIL/uL Final  . Hemoglobin 11/07/2019 9.3* 12.0 - 15.0 g/dL Final  . HCT 09/62/8366 28.7* 36.0 - 46.0 % Final  . MCV 11/07/2019 88.3  80.0 - 100.0 fL Final  . MCH 11/07/2019 28.6  26.0 - 34.0 pg Final  . MCHC  11/07/2019 32.4  30.0 - 36.0 g/dL Final  . RDW 29/47/6546 13.2  11.5 - 15.5 % Final  . Platelets 11/07/2019 199  150 - 400 K/uL Final  . nRBC 11/07/2019 0.0  0.0 - 0.2 % Final   Performed at [facility], [address], [place], [place] [address]    Assessment: 34 y.o. s/p emergency C-section stable  Plan: Patient has done well after surgery with no apparent complications.  I have discussed the post-operative course to date, and the expected progress moving forward.  The patient understands what complications to be concerned about.  I will see the patient in routine follow up, or sooner if needed.    Activity plan: No heavy lifting.   Vena Austria, MD, [facility], [facility] 11/18/2019, 11:50 AM

## 2019-11-21 NOTE — Telephone Encounter (Signed)
Needs to sign 30 day paper first then we will schedule sometime after her 6 week postpartum visit

## 2019-11-22 ENCOUNTER — Telehealth: Payer: Self-pay | Admitting: Obstetrics and Gynecology

## 2019-11-22 NOTE — Telephone Encounter (Signed)
Patient is aware to come by the office to sign BTL forms prior to PP visit. While speaking with patient she has a concern that something is falling out of her vagina. Patient reports no pain at this time.

## 2019-11-22 NOTE — Telephone Encounter (Signed)
Might have some mild uterine prolapse following any delivery.  If she wants to I can see her tomorrow at 0900 in [facility]

## 2019-11-22 NOTE — Telephone Encounter (Signed)
Patient is schedule 11/23/19 with AMS at [facility].

## 2019-11-23 ENCOUNTER — Encounter: Payer: Self-pay | Admitting: Obstetrics and Gynecology

## 2019-11-23 ENCOUNTER — Other Ambulatory Visit: Payer: Self-pay

## 2019-11-23 ENCOUNTER — Ambulatory Visit (INDEPENDENT_AMBULATORY_CARE_PROVIDER_SITE_OTHER): Payer: Medicaid Other | Admitting: Obstetrics and Gynecology

## 2019-11-23 VITALS — BP 114/69 | Wt 189.0 lb

## 2019-11-23 DIAGNOSIS — Z4889 Encounter for other specified surgical aftercare: Secondary | ICD-10-CM

## 2019-11-23 DIAGNOSIS — Z308 Encounter for other contraceptive management: Secondary | ICD-10-CM

## 2019-11-23 NOTE — Progress Notes (Signed)
Postoperative Follow-up Patient presents post op from 1lTCS 2weeks ago for umbilical cord prolapse.  Subjective: Patient reports some improvement in her preop symptoms. Eating a regular diet without difficulty. Pain is controlled without any medications.  Activity: normal activities of daily living.  This is a work in appointment because the patient is worried she has something hanging out of her vagina.  No associated pain, no changes in bleeding.  Objective: Blood pressure 114/69, weight 189 lb (85.7 kg), not currently breastfeeding.  General: NAD Pulmonary: no increased work of breathing Abdomen: soft, non-tender, non-distended, incision(s) D/C/I GU: normal external female genitalia.  The area of concern is actually the patient cervix just above the hymenal ring with valsalva Extremities: no edema Neurologic: normal gait    Admission on 11/06/2019, Discharged on 11/08/2019  Component Date Value Ref Range Status  . WBC 11/06/2019 19.5* 4.0 - 10.5 K/uL Final  . RBC 11/06/2019 3.80* 3.87 - 5.11 MIL/uL Final  . Hemoglobin 11/06/2019 10.8* 12.0 - 15.0 g/dL Final  . HCT 11/06/2019 33.7* 36.0 - 46.0 % Final  . MCV 11/06/2019 88.7  80.0 - 100.0 fL Final  . MCH 11/06/2019 28.4  26.0 - 34.0 pg Final  . MCHC 11/06/2019 32.0  30.0 - 36.0 g/dL Final  . RDW 11/06/2019 13.4  11.5 - 15.5 % Final  . Platelets 11/06/2019 192  150 - 400 K/uL Final  . nRBC 11/06/2019 0.0  0.0 - 0.2 % Final   Performed at [facility], [address] [place], [place] [address]  . RPR Ser Ql 11/06/2019 NON REACTIVE  NON REACTIVE Final   Performed at [facility], [place] [address], [place], [place] [address]  . ABO/RH(D) 11/06/2019 A POS   Final  . Antibody Screen 11/06/2019 NEG   Final  . Sample Expiration 11/06/2019    Final                   Value:11/09/2019,2359 Performed at [facility], [address] [place], [place] [address]   . WBC 11/07/2019 19.3* 4.0 - 10.5 K/uL  Final  . RBC 11/07/2019 3.25* 3.87 - 5.11 MIL/uL Final  . Hemoglobin 11/07/2019 9.3* 12.0 - 15.0 g/dL Final  . HCT 11/07/2019 28.7* 36.0 - 46.0 % Final  . MCV 11/07/2019 88.3  80.0 - 100.0 fL Final  . MCH 11/07/2019 28.6  26.0 - 34.0 pg Final  . MCHC 11/07/2019 32.4  30.0 - 36.0 g/dL Final  . RDW 11/07/2019 13.2  11.5 - 15.5 % Final  . Platelets 11/07/2019 199  150 - 400 K/uL Final  . nRBC 11/07/2019 0.0  0.0 - 0.2 % Final   Performed at [facility], [address] [place], [place] [address]    Assessment: 34 y.o. s/p 1LTCS stable  Plan: Patient has done well after surgery with no apparent complications.  I have discussed the post-operative course to date, and the expected progress moving forward.  The patient understands what complications to be concerned about.  I will see the patient in routine follow up, or sooner if needed.    Activity plan: No heavy lifting.  Discussed that some degree of uterine prolapse is common following delivery but this almost universally improves with time.  No interventions warranted  BTL consent signed.  Will plan on interval BTL after 6 week postpartum visit. 34 y.o. [id number]  with undesired fertility, desires permanent sterilization.  Other reversible forms of contraception were discussed with patient;  she declines all other modalities. Permanent nature of as well as associated risks of the procedure discussed with patient including but not limited to: risk of regret, permanence of method, bleeding, infection, injury to surrounding organs and need for additional procedures.  Failure risk of 0.5-1% with increased risk of ectopic gestation if pregnancy occurs was also discussed with patient.      Vena Austria, MD, [facility], [facility] 11/23/2019, 11:12 AM  Schedule BTL Tubal paper signed POP (cervix)

## 2019-11-26 ENCOUNTER — Emergency Department: Payer: Medicaid Other

## 2019-11-26 ENCOUNTER — Emergency Department
Admission: EM | Admit: 2019-11-26 | Discharge: 2019-11-26 | Disposition: A | Discharge status: Home / Self Care | Payer: Medicaid Other | Attending: Emergency Medicine | Admitting: Emergency Medicine

## 2019-11-26 ENCOUNTER — Other Ambulatory Visit: Payer: Self-pay

## 2019-11-26 DIAGNOSIS — R4182 Altered mental status, unspecified: Secondary | ICD-10-CM | POA: Diagnosis not present

## 2019-11-26 DIAGNOSIS — O99893 Other specified diseases and conditions complicating puerperium: Secondary | ICD-10-CM | POA: Diagnosis present

## 2019-11-26 DIAGNOSIS — Z79899 Other long term (current) drug therapy: Secondary | ICD-10-CM | POA: Insufficient documentation

## 2019-11-26 LAB — URINE DRUG SCREEN, QUALITATIVE (ARMC ONLY)
Amphetamines, Ur Screen: NOT DETECTED
Barbiturates, Ur Screen: NOT DETECTED
Benzodiazepine, Ur Scrn: NOT DETECTED
Cannabinoid 50 Ng, Ur ~~LOC~~: POSITIVE — AB
Cocaine Metabolite,Ur ~~LOC~~: NOT DETECTED
MDMA (Ecstasy)Ur Screen: NOT DETECTED
Methadone Scn, Ur: NOT DETECTED
Opiate, Ur Screen: NOT DETECTED
Phencyclidine (PCP) Ur S: NOT DETECTED
Tricyclic, Ur Screen: NOT DETECTED

## 2019-11-26 LAB — COMPREHENSIVE METABOLIC PANEL
ALT: 10 U/L (ref 0–44)
AST: 17 U/L (ref 15–41)
Albumin: 3.7 g/dL (ref 3.5–5.0)
Alkaline Phosphatase: 58 U/L (ref 38–126)
Anion gap: 11 (ref 5–15)
BUN: 10 mg/dL (ref 6–20)
CO2: 24 mmol/L (ref 22–32)
Calcium: 8.7 mg/dL — ABNORMAL LOW (ref 8.9–10.3)
Chloride: 103 mmol/L (ref 98–111)
Creatinine, Ser: 0.76 mg/dL (ref 0.44–1.00)
GFR calc Af Amer: >60 mL/min (ref >60)
GFR calc non Af Amer: >60 mL/min (ref >60)
Glucose, Bld: 162 mg/dL — ABNORMAL HIGH (ref 70–99)
Potassium: 2.8 mmol/L — ABNORMAL LOW (ref 3.5–5.1)
Sodium: 138 mmol/L (ref 135–145)
Total Bilirubin: 0.8 mg/dL (ref 0.3–1.2)
Total Protein: 7.5 g/dL (ref 6.5–8.1)

## 2019-11-26 LAB — URINALYSIS, COMPLETE (UACMP) WITH MICROSCOPIC
Glucose, UA: NEGATIVE mg/dL
Hgb urine dipstick: NEGATIVE
Ketones, ur: NEGATIVE mg/dL
Leukocytes,Ua: NEGATIVE
Nitrite: NEGATIVE
Protein, ur: 30 mg/dL — AB
Specific Gravity, Urine: 1.029 (ref 1.005–1.030)
pH: 5 (ref 5.0–8.0)

## 2019-11-26 LAB — ACETAMINOPHEN LEVEL: Acetaminophen (Tylenol), Serum: <10 ug/mL — ABNORMAL LOW (ref 10–30)

## 2019-11-26 LAB — ETHANOL: Alcohol, Ethyl (B): <10 mg/dL (ref <10)

## 2019-11-26 LAB — CBC
HCT: 35.6 % — ABNORMAL LOW (ref 36.0–46.0)
Hemoglobin: 11.1 g/dL — ABNORMAL LOW (ref 12.0–15.0)
MCH: 27.1 pg (ref 26.0–34.0)
MCHC: 31.2 g/dL (ref 30.0–36.0)
MCV: 86.8 fL (ref 80.0–100.0)
Platelets: 491 10*3/uL — ABNORMAL HIGH (ref 150–400)
RBC: 4.1 MIL/uL (ref 3.87–5.11)
RDW: 13.2 % (ref 11.5–15.5)
WBC: 13.9 10*3/uL — ABNORMAL HIGH (ref 4.0–10.5)
nRBC: 0 % (ref 0.0–0.2)

## 2019-11-26 LAB — LIPASE, BLOOD: Lipase: 31 U/L (ref 11–51)

## 2019-11-26 LAB — GLUCOSE, CAPILLARY: Glucose-Capillary: 138 mg/dL — ABNORMAL HIGH (ref 70–99)

## 2019-11-26 MED ORDER — GADOBUTROL 1 MMOL/ML IV SOLN
7.5000 mL | Freq: Once | INTRAVENOUS | Status: AC | PRN
  Administered 2019-11-26: 7.5 mL via INTRAVENOUS

## 2019-11-26 MED ORDER — POTASSIUM CHLORIDE 10 MEQ/100ML IV SOLN
10.0000 meq | Freq: Once | INTRAVENOUS | Status: AC
  Administered 2019-11-26: 10 meq via INTRAVENOUS
  Filled 2019-11-26: qty 100

## 2019-11-26 MED ORDER — SODIUM CHLORIDE 0.9 % IV BOLUS
1000.0000 mL | Freq: Once | INTRAVENOUS | Status: AC
  Administered 2019-11-26: 1000 mL via INTRAVENOUS

## 2019-11-26 MED ORDER — LORAZEPAM 2 MG/ML IJ SOLN
1.0000 mg | Freq: Once | INTRAMUSCULAR | Status: AC
  Administered 2019-11-26: 1 mg via INTRAVENOUS
  Filled 2019-11-26: qty 1

## 2019-11-26 NOTE — ED Notes (Signed)
Pt transported to CT ?

## 2019-11-26 NOTE — ED Triage Notes (Signed)
Fiance states that pt ate chickfila and when he checked back on her she was unresponsive. Sister had called fiance and told him that he needed to come back and check on pt. Vomited en route to ER. Pt not answering questions in triage. Fiance denies hx of seizures or DM. He denies ETOH or drug use. Fiance states pt had a baby 11/06/19.

## 2019-11-26 NOTE — ED Notes (Addendum)
Pt fiance given phone for MRI to do screening- pt resting with eyes closed and even respirations

## 2019-11-26 NOTE — ED Provider Notes (Signed)
[facility] Emergency Department Provider Note  Time seen: 3:33 PM  I have reviewed the triage vital signs and the nursing notes.   HISTORY  Chief Complaint Altered Mental Status    HPI Lisa Rowland is a 34 y.o. female with a past medical history of anxiety, anemia, bipolar, 3 weeks status post C-section, presents to the emergency department for altered mental status.  According to the patient's fianc he states he was out, the patient had just eaten [employer] when he returned the patient was extremely abnormal, unable to speak, not very responsive.  He denies any known history of alcohol or drugs.  Denies any known history of seizures.  Currently patient is awake, she is not talking, she will motion to her mouth or other body parts at times, appears to be quite altered.  Patient does appear to be moving all extremities at times.  Required the fianc and myself to get her from the wheelchair into the bed however she was able to bear some weight with assistance.  Past Medical History:  Diagnosis Date  . Anemia   . Anxiety   . ASCUS favor benign   . Bipolar 1 disorder ([place])   . History of preterm delivery    with spontaneous labor in 2010 and 2015  . History of preterm premature rupture of membranes (PPROM)    2010 at 32 weeks  . HPV in female   . HSV infection   . Hypotension   . Scoliosis     Patient Active Problem List   Diagnosis Date Noted  . Status post cesarean delivery 11/08/2019  . Umbilical cord prolapse 42/68/3419  . Premature rupture of membranes 10/30/2019  . Vaginal bleeding during pregnancy 10/20/2019  . Cervical cerclage suture present, antepartum 10/03/2019  . Short cervix 08/22/2019  . Current pregnancy with history of pre-term labor in first trimester 07/28/2019  . History of herpes genitalis 07/28/2019  . Herpes virus infection in mother during pregnancy, antepartum 07/28/2019  . Hyperemesis affecting pregnancy, antepartum  06/15/2019  . Hyperemesis 06/15/2019  . Supervision of high-risk pregnancy 05/24/2017  . Headache, migraine 01/23/2014  . 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 Clinical trial participant 11/25/2013  . Opioid dependence ([place]) 10/27/2013  . Myofascial pain 10/10/2013  . Continuous opioid dependence ([place]) 09/22/2013  . Bipolar affective disorder ([place]) 08/19/2013  . Scoliosis 08/19/2013  . Lesion of vulva 08/02/2013  . HPV test positive 06/08/2013  . Cervical pain 04/03/2012    Past Surgical History:  Procedure Laterality Date  . CERVICAL CERCLAGE  08/31/2020  . CESAREAN SECTION N/A 11/06/2019   Procedure: CESAREAN SECTION;  Surgeon: Malachy Mood, MD;  Location: [facility] ORS;  Service: Obstetrics;  Laterality: N/A;  . INDUCED ABORTION      Prior to Admission medications   Medication Sig Start Date End Date Taking? Authorizing Provider  ibuprofen (ADVIL) 800 MG tablet Take 1 tablet (800 mg total) by mouth every 8 (eight) hours. 11/08/19   Will Bonnet, MD  lidocaine-prilocaine (EMLA) cream Apply 1 application topically as needed. 11/15/19   Malachy Mood, MD  oxyCODONE-acetaminophen (PERCOCET/ROXICET) 5-325 MG tablet Take 1 tablet by mouth every 4 (four) hours as needed (breakthrough pain). 11/15/19   Malachy Mood, MD  Prenatal Vit-Fe Fumarate-FA (PRENATAL MULTIVITAMIN) TABS tablet Take 1 tablet by mouth daily at 12 noon. Pt takes these as gummies    [provider]  albuterol (PROVENTIL HFA;VENTOLIN HFA) 108 (90 Base) MCG/ACT inhaler Inhale 2 puffs into the lungs  every 6 (six) hours as needed for wheezing or shortness of breath. 10/10/17 04/03/19  Sherrie Mustache Roselyn Bering, PA-C    Allergies  Allergen Reactions  . Amoxicillin Hives  . Bee Venom Swelling and Shortness Of Breath  . Coconut Oil Shortness Of Breath and Swelling  . Mushroom Extract Complex Swelling  . Other Anaphylaxis and Swelling    Coconut Coconut    Family History  Problem Relation Age of Onset  .  Osteoarthritis Mother   . Asthma Mother   . Hypertension Mother   . Osteoarthritis Father   . Hypertension Father   . Osteoarthritis Maternal Grandmother   . Asthma Maternal Grandmother   . Cancer Maternal Grandmother        breast cancer  . Diabetes Maternal Grandmother   . Heart failure Maternal Grandmother   . Hyperlipidemia Maternal Grandmother   . Hypertension Maternal Grandmother   . Migraines Maternal Grandmother   . Thyroid disease Maternal Grandmother   . Osteoarthritis Paternal Grandmother   . Asthma Paternal Grandmother   . Cancer Paternal Grandmother        stomach  . Diabetes Paternal Grandmother   . Heart failure Paternal Grandmother   . Hyperlipidemia Paternal Grandmother   . Hypertension Paternal Grandmother   . Breast cancer Paternal Aunt   . Hypertension Sister     Social History Social History   Tobacco Use  . Smoking status: Never Smoker  . Smokeless tobacco: Never Used  Substance Use Topics  . Alcohol use: No  . Drug use: No    Review of Systems Unable to obtain adequate/accurate review of systems at this time secondary to altered mental status  ____________________________________________   PHYSICAL EXAM:  VITAL SIGNS: ED Triage Vitals  Enc Vitals Group     BP 11/26/19 1448 124/60     Pulse Rate 11/26/19 1448 93     Resp 11/26/19 1448 18     Temp 11/26/19 1448 98.4 F (36.9 C)     Temp Source 11/26/19 1448 Axillary     SpO2 11/26/19 1448 97 %     Weight 11/26/19 1450 175 lb (79.4 kg)     Height 11/26/19 1450 5\' 7"  (1.702 m)     Head Circumference --      Peak Flow --      Pain Score --      Pain Loc --      Pain Edu? --      Excl. in GC? --    Constitutional: Patient is awake and alert, will occasionally look at you when you talk to her, will occasionally mouth responses I do not appear to make sense or occasionally point or gesture to body parts but again does not appear to be answering any questions.  Is not following commands  such as squeezing fingers, etc. Eyes: 4 mm bilaterally, reactive ENT      Head: Normocephalic and atraumatic.      Mouth/Throat: Mucous membranes are moist. Cardiovascular: Normal rate, regular rhythm.  Respiratory: Normal respiratory effort without tachypnea nor retractions. Breath sounds are clear Gastrointestinal: Soft and nontender. No distention.   Musculoskeletal: Nontender with normal range of motion in all extremities.  Neurologic: Patient is quite altered, unable to follow commands or answer questions, will occasionally gesture to various body parts.  Does appear to be moving all extremities at different times.  Was able to bear weight on her lower extremities with assistance. Skin:  Skin is warm, dry and intact.  Psychiatric: Altered  ____________________________________________    EKG  EKG viewed and interpreted by myself shows a normal sinus rhythm at 73 bpm with a narrow QRS, normal axis, normal intervals, no concerning ST changes.  ____________________________________________    RADIOLOGY  CT scans are negative. MRIs are negative.  ____________________________________________   INITIAL IMPRESSION / ASSESSMENT AND PLAN / ED COURSE  Pertinent labs & imaging results that were available during my care of the patient were reviewed by me and considered in my medical decision making (see chart for details).   Patient presents to the emergency department with altered mental status.  Neysa Bonito states he has never seen her act like this before.  Denies any drugs or substance use or history of seizures.  Patient is acting quite altered.  Differential is extremely broad at this time but would include infectious etiology, central neurologic abnormalities such as subarachnoid, CVA CVT, possible substance use or postictal state.  We will check labs, obtain a urine sample, drug screen, CT scan of the head.  We will dose Ativan to help the patient relax as she is not currently able to  sit still long enough for the CT imaging.  Patient does not breast-feed per fianc.  I have personally reviewed the CT images do not appear to see any significant abnormality.  Awaiting radiology read.  I have reevaluated the patient as well, she continues to be altered, she is able to shake her head yes or no to answer questions.  Denies any pain.  States she feels weak.  States nausea.  Continues to be very somnolent, still not speaking, still appears somewhat confused.  Lab work shows urine tox screen positive for cannabinoids only.  No significant findings.  We will proceed with MR imaging of the brain including MRV to rule out CVT.  Patient's MRI is resulted normal.  I reevaluated the patient now the patient is awake alert oriented x4.  States she does not know what happened but states she "feels back to my normal self."  Given the patient's significant altered mental status presentation I still advised that she be admitted to the hospital and observed overnight.  Patient states she feels normal and would like to be discharged home.  Not entirely sure what the cause of the patient's presentation.  Her symptoms have seemed to resolve.  Patient appears very well at this point.  We will discharge the patient home per her request.  NIRALYA OHANIAN was evaluated in Emergency Department on 11/26/2019 for the symptoms described in the history of present illness. She was evaluated in the context of the global COVID-19 pandemic, which necessitated consideration that the patient might be at risk for infection with the SARS-CoV-2 virus that causes COVID-19. Institutional protocols and algorithms that pertain to the evaluation of patients at risk for COVID-19 are in a state of rapid change based on information released by regulatory bodies including the CDC and federal and state organizations. These policies and algorithms were followed during the patient's care in the  ED.  ____________________________________________   FINAL CLINICAL IMPRESSION(S) / ED DIAGNOSES  Altered mental status   Minna Antis, MD 11/26/19 2020

## 2019-11-26 NOTE — ED Notes (Addendum)
Pt would not answer questions for this RN. Pt would shake head and nod head but not in relation to questions. Pt would pick hands up off wheelchair and put them back down. Motions pt is making are repetitive but do not correlate to this RN talking to pt or fiance.  Fiance stated that the baby pt had 11/06/19 was born premature and is still in the hospital. Pt is not provided breast milk per fiance.   Spoke with Dr. Cyril Loosen about pt presentation, verbal orders received.

## 2019-11-26 NOTE — ED Notes (Signed)
Pt transported to MRI 

## 2019-11-29 ENCOUNTER — Telehealth: Payer: Self-pay | Admitting: Obstetrics and Gynecology

## 2019-11-29 NOTE — Telephone Encounter (Signed)
Rose, from [facility] is needing to speak with Dr. Bonney Aid on this patient. She has concerns about patient and would like feed back please advise .[phone]

## 2019-11-30 NOTE — Telephone Encounter (Signed)
This has been corrected to Laparoscopic Bilateral Salpingectomy as per ordered.

## 2019-11-30 NOTE — Telephone Encounter (Signed)
-----   Message from Vena Austria, MD sent at 11/23/2019 11:14 AM EST ----- Regarding: Surgery Surgery Booking Request Patient Full Name:  Lisa Rowland  MRN: [id number]  DOB: 05-07-1986  Surgeon: Vena Austria, MD  Requested Surgery Date and Time: sometime after 30 days from today Primary Diagnosis AND Code: Desires surgical sterilization Secondary Diagnosis and Code:  Surgical Procedure: laparoscopic sterilization via bilateral salpingectomy L&D Notification: No Admission Status: same day surgery Length of Surgery: 1hr Special Case Needs: No H&P: No Phone Interview???:  No Interpreter: No Language:  Medical Clearance:  No Special Scheduling Instructions: Medicaid consent signed 11/23/2018 Any known health/anesthesia issues, diabetes, sleep apnea, latex allergy, defibrillator/pacemaker?: No Acuity: P3   (P1 highest, P2 delay may cause harm, P3 low, elective gyn, P4 lowest)

## 2019-11-30 NOTE — Telephone Encounter (Signed)
Called to offer surgery dates to pt.  DOS 4/27 w Dr Bonney Aid  H&P N/A per Bonney Aid  Covid 4/23 @ 8-10:30am, [address], drive up and wear mask. Adv pt to quar after test until DOS.  Adv pt she will be sch for a Pre-admit phone appt. Conf that pt is an active MyChart user and adv that the appt will be posted on her chart. Adv she may also rec calls from [employer] and/or pre-service ctr.  Conf that pt has Medicaid as prim/only insurance  Pt has signed Medicaid sterilization date 11/23/19 on file

## 2019-12-02 ENCOUNTER — Other Ambulatory Visit: Payer: Self-pay

## 2019-12-02 NOTE — Telephone Encounter (Signed)
Pt called triage line stating she is still having pain on her right side. She states at her last visit [facility] told her he could call her in a refill of pain medication  Pt is aware [facility] is out of the office today and will not return until Tuesday 3/23. I advised she do Tylenol/Ibuprofen alternation. She voiced an understanding.

## 2019-12-06 ENCOUNTER — Other Ambulatory Visit: Payer: Self-pay | Admitting: Obstetrics and Gynecology

## 2019-12-06 MED ORDER — HYDROCODONE-ACETAMINOPHEN 5-325 MG PO TABS: 1.0000 | ORAL_TABLET | Freq: Four times a day (QID) | ORAL | 0 refills | Status: DC | PRN

## 2019-12-06 NOTE — Progress Notes (Signed)
Still having right fascial pain.  Rx vicodin

## 2019-12-06 NOTE — Telephone Encounter (Signed)
Pt aware.

## 2019-12-06 NOTE — Telephone Encounter (Signed)
Rx sent 

## 2019-12-26 ENCOUNTER — Encounter: Payer: Self-pay | Admitting: Obstetrics and Gynecology

## 2019-12-26 ENCOUNTER — Other Ambulatory Visit: Payer: Self-pay

## 2019-12-26 ENCOUNTER — Ambulatory Visit (INDEPENDENT_AMBULATORY_CARE_PROVIDER_SITE_OTHER): Payer: Medicaid Other | Admitting: Obstetrics and Gynecology

## 2019-12-26 DIAGNOSIS — Z308 Encounter for other contraceptive management: Secondary | ICD-10-CM

## 2019-12-26 DIAGNOSIS — Z1332 Encounter for screening for maternal depression: Secondary | ICD-10-CM | POA: Diagnosis not present

## 2019-12-26 DIAGNOSIS — Z01818 Encounter for other preprocedural examination: Secondary | ICD-10-CM

## 2019-12-26 NOTE — Progress Notes (Signed)
Postpartum Visit  Chief Complaint:  Chief Complaint  Patient presents with  . Postpartum Care    C/S 2/21    History of Present Illness: Patient is a 34 y.o. [id number] presents for postpartum visit.  Date of delivery: 11/06/2019 Cesarean Section: umbilical cord prolapse Pregnancy or labor problems:  no Any problems since the delivery:  no  Newborn Details:  SINGLETON :  1. BabyGender female remains in NICU Maternal Details:  Breast or formula feeding: plans to breastfeed Contraception after delivery: No  Any bowel or bladder issues: No  Post partum depression/anxiety noted:  no Edinburgh Post-Partum Depression Score: 3 Date of last PAP: 06/15/2019  NIL and HR HPV negative   Review of Systems: Review of Systems  Constitutional: Negative.   Gastrointestinal: Negative.   Genitourinary: Negative.   Psychiatric/Behavioral: Negative.     The following portions of the patient's history were reviewed and updated as appropriate: allergies, current medications, past family history, past medical history, past social history, past surgical history and problem list.  Past Medical History:  Past Medical History:  Diagnosis Date  . Anemia   . Anxiety   . ASCUS favor benign   . Bipolar 1 disorder (HCC)   . History of preterm delivery    with spontaneous labor in 2010 and 2015  . History of preterm premature rupture of membranes (PPROM)    2010 at 32 weeks  . HPV in female   . HSV infection   . Hypotension   . Scoliosis     Past Surgical History:  Past Surgical History:  Procedure Laterality Date  . CERVICAL CERCLAGE  08/31/2020  . CESAREAN SECTION N/A 11/06/2019   Procedure: CESAREAN SECTION;  Surgeon: Vena Austria, MD;  Location: [facility] ORS;  Service: Obstetrics;  Laterality: N/A;  . INDUCED ABORTION      Family History:  Family History  Problem Relation Age of Onset  . Osteoarthritis Mother   . Asthma Mother   . Hypertension Mother   . Osteoarthritis Father    . Hypertension Father   . Osteoarthritis Maternal Grandmother   . Asthma Maternal Grandmother   . Cancer Maternal Grandmother        breast cancer  . Diabetes Maternal Grandmother   . Heart failure Maternal Grandmother   . Hyperlipidemia Maternal Grandmother   . Hypertension Maternal Grandmother   . Migraines Maternal Grandmother   . Thyroid disease Maternal Grandmother   . Osteoarthritis Paternal Grandmother   . Asthma Paternal Grandmother   . Cancer Paternal Grandmother        stomach  . Diabetes Paternal Grandmother   . Heart failure Paternal Grandmother   . Hyperlipidemia Paternal Grandmother   . Hypertension Paternal Grandmother   . Breast cancer Paternal Aunt   . Hypertension Sister     Social History:  Social History   Socioeconomic History  . Marital status: Single    Spouse name: Not on file  . Number of children: Not on file  . Years of education: Not on file  . Highest education level: Not on file  Occupational History  . Not on file  Tobacco Use  . Smoking status: Never Smoker  . Smokeless tobacco: Never Used  Substance and Sexual Activity  . Alcohol use: No  . Drug use: No  . Sexual activity: Yes    Birth control/protection: Surgical    Comment: Hysterectomy 01/10/20  Other Topics Concern  . Not on file  Social History Narrative  .  Not on file   Social Determinants of Health   Financial Resource Strain:   . Difficulty of Paying Living Expenses:   Food Insecurity:   . Worried About Programme researcher, broadcasting/film/video in the Last Year:   . Barista in the Last Year:   Transportation Needs:   . Freight forwarder (Medical):   Marland Kitchen Lack of Transportation (Non-Medical):   Physical Activity:   . Days of Exercise per Week:   . Minutes of Exercise per Session:   Stress:   . Feeling of Stress :   Social Connections:   . Frequency of Communication with Friends and Family:   . Frequency of Social Gatherings with Friends and Family:   . Attends Religious  Services:   . Active Member of Clubs or Organizations:   . Attends Banker Meetings:   Marland Kitchen Marital Status:   Intimate Partner Violence:   . Fear of Current or Ex-Partner:   . Emotionally Abused:   Marland Kitchen Physically Abused:   . Sexually Abused:     Allergies:  Allergies  Allergen Reactions  . Amoxicillin Hives  . Bee Venom Swelling and Shortness Of Breath  . Coconut Oil Shortness Of Breath and Swelling  . Mushroom Extract Complex Swelling  . Other Anaphylaxis and Swelling    Coconut    Medications: Prior to Admission medications   Medication Sig Start Date End Date Taking? Authorizing Provider  HYDROcodone-acetaminophen (NORCO/VICODIN) 5-325 MG tablet Take 1 tablet by mouth every 6 (six) hours as needed. 12/06/19   Vena Austria, MD  lidocaine-prilocaine (EMLA) cream Apply 1 application topically as needed. 11/15/19   Vena Austria, MD  albuterol (PROVENTIL HFA;VENTOLIN HFA) 108 (90 Base) MCG/ACT inhaler Inhale 2 puffs into the lungs every 6 (six) hours as needed for wheezing or shortness of breath. 10/10/17 04/03/19  Fisher, Roselyn Bering, PA-C    Physical Exam Blood pressure (!) 106/58, height 5\' 7"  (1.702 m), weight 192 lb (87.1 kg), last menstrual period 12/19/2019, not currently breastfeeding.  General: NAD HEENT: normocephalic, anicteric Pulmonary: No increased work of breathing. CTAB CV: RRR Abdomen: NABS, soft, non-tender, non-distended.  Umbilicus without lesions.  No hepatomegaly, splenomegaly or masses palpable. No evidence of hernia. Incision D/C/I Genitourinary:  External: Normal external female genitalia.  Normal urethral meatus, normal  Bartholin's and Skene's glands.    Vagina: Normal vaginal mucosa, no evidence of prolapse.    Cervix: Grossly normal in appearance, no bleeding  Uterus: Non-enlarged, mobile, normal contour.  No CMT  Adnexa: ovaries non-enlarged, no adnexal masses  Rectal: deferred Extremities: no edema, erythema, or  tenderness Neurologic: Grossly intact Psychiatric: mood appropriate, affect full  Edinburgh Postnatal Depression Scale - 12/26/19 1141      Edinburgh Postnatal Depression Scale:  In the Past 7 Days   I have been able to laugh and see the funny side of things.  0    I have looked forward with enjoyment to things.  0    I have blamed myself unnecessarily when things went wrong.  1    I have been anxious or worried for no good reason.  0    I have felt scared or panicky for no good reason.  1    Things have been getting on top of me.  0    I have been so unhappy that I have had difficulty sleeping.  0    I have felt sad or miserable.  1    I have been so  unhappy that I have been crying.  0    The thought of harming myself has occurred to me.  0    Edinburgh Postnatal Depression Scale Total  3       Assessment: 34 y.o. [id number] presenting for 6 week postpartum visit  Plan: Problem List Items Addressed This Visit    None      1) Contraception - Education given regarding options for contraception, as well as compatibility with breast feeding if applicable.  Patient plans on tubal ligation for contraception. - consents today - orders placed  2)  Pap - ASCCP guidelines and rational discussed.  ASCCP guidelines and rational discussed.  Patient opts for every 3 years screening interval  3) Patient underwent screening for postpartum depression with no signs of depression  4) No follow-ups on file.   Malachy Mood, MD, [facility], [facility] 12/26/2019, 11:44 AM

## 2020-01-02 ENCOUNTER — Encounter
Admission: RE | Admit: 2020-01-02 | Discharge: 2020-01-02 | Disposition: A | Discharge status: Home / Self Care | Payer: Medicaid Other | Source: Ambulatory Visit | Attending: Obstetrics and Gynecology | Admitting: Obstetrics and Gynecology

## 2020-01-02 ENCOUNTER — Other Ambulatory Visit: Payer: Self-pay

## 2020-01-02 NOTE — Patient Instructions (Signed)
Your procedure is scheduled on: Tuesday January 10, 2020 Report to Day Surgery. To find out your arrival time please call [phone] between 1PM - 3PM on Monday January 09, 2020.  Remember: Instructions that are not followed completely may result in serious medical risk,  up to and including death, or upon the discretion of your surgeon and anesthesiologist your  surgery may need to be rescheduled.     _X__ 1. Do not eat food after midnight the night before your procedure.                 No gum chewing or hard candies. You may drink clear liquids up to 2 hours                 before you are scheduled to arrive for your surgery- DO not drink clear                 liquids within 2 hours of the start of your surgery.                 Clear Liquids include:  water, apple juice without pulp, clear Gatorade, G2 or                  Gatorade Zero (avoid Red/Purple/Blue), Black Coffee or Tea (Do not add                 anything to coffee or tea).  __X__2.   Complete the carbohydrate drink provided to you, 2 hours before arrival.  __X__3.  On the morning of surgery brush your teeth with toothpaste and water, you                may rinse your mouth with mouthwash if you wish.  Do not swallow any toothpaste of mouthwash.     _X__ 4.  No Alcohol for 24 hours before or after surgery.   _X__ 5.  Do Not Smoke or use e-cigarettes For 24 Hours Prior to Your Surgery.                 Do not use any chewable tobacco products for at least 6 hours prior to                 Surgery.  __X__ 6.  Do not use any recreational drugs (marijuana, cocaine, heroin, ecstacy, MDMA or other)                For at least one week prior to your surgery.  Combination of these drugs with anesthesia                May have life threatening results.  __X__  7.  Notify your doctor if there is any change in your medical condition      (cold, fever, infections).     Do not wear jewelry, make-up, hairpins,  clips or nail polish. Do not wear lotions, powders, or perfumes. You may wear deodorant. Do not shave 48 hours prior to surgery. Men may shave face and neck. Do not bring valuables to the hospital.    [facility] is not responsible for any belongings or valuables.  Contacts, dentures or bridgework may not be worn into surgery. Leave your suitcase in the car. After surgery it may be brought to your room. For patients admitted to the hospital, discharge time is determined by your treatment team.   Patients discharged the day of surgery will not be allowed to  drive home.   Make arrangements for someone to be with you for the first 24 hours of your Same Day Discharge.   ____ Take these medicines the morning of surgery with A SIP OF WATER:    1. None   __X__ Use CHG Soap as directed  __X__ Stop Anti-inflammatories such as ibuprofen, Aleve, naproxen, aspirins and or BC powders.    __X__ Stop supplements until after surgery.    __X__ Do not add any herbal supplements before your surgery.

## 2020-01-03 ENCOUNTER — Emergency Department
Admission: EM | Admit: 2020-01-03 | Discharge: 2020-01-04 | Disposition: A | Discharge status: Home / Self Care | Payer: Medicaid Other | Attending: Emergency Medicine | Admitting: Emergency Medicine

## 2020-01-03 ENCOUNTER — Encounter: Payer: Self-pay | Admitting: Emergency Medicine

## 2020-01-03 ENCOUNTER — Emergency Department: Payer: Medicaid Other

## 2020-01-03 ENCOUNTER — Other Ambulatory Visit: Payer: Self-pay

## 2020-01-03 DIAGNOSIS — M25561 Pain in right knee: Secondary | ICD-10-CM | POA: Diagnosis not present

## 2020-01-03 MED ORDER — ACETAMINOPHEN 500 MG PO TABS
1000.0000 mg | ORAL_TABLET | Freq: Once | ORAL | Status: AC
  Administered 2020-01-04: 1000 mg via ORAL
  Filled 2020-01-03: qty 2

## 2020-01-03 NOTE — ED Triage Notes (Addendum)
Pt to triage via w/c with no distress noted; pt reports since Friday having pain to rt knee/lower leg with swelling; denies any known injuries, denies any accomp symptoms; 56mos post partum

## 2020-01-04 ENCOUNTER — Emergency Department: Payer: Medicaid Other

## 2020-01-04 NOTE — ED Provider Notes (Signed)
[facility] Emergency Department Provider Note  ____________________________________________  Time seen: Approximately 12:09 AM  I have reviewed the triage vital signs and the nursing notes.   HISTORY  Chief Complaint Leg Pain   HPI [name] is a 34 y.o. female status post C-section 8 weeks ago who presents for evaluation of R knee pain. Patient reports 2 days of progressively worsening R knee and swelling. She describes the pain as sharp, starting in the anterior aspect of her knee and radiating down her leg. She denies any trauma. No prior h/o PE or DVT. No SOB or CP. No back pain, no saddle anesthesia, no urinary or bowel incontinence of retention. She has not taken anything at home for it.    Past Medical History:  Diagnosis Date  . Anemia   . Anxiety   . ASCUS favor benign   . History of preterm delivery    with spontaneous labor in 2010 and 2015  . History of preterm premature rupture of membranes (PPROM)    2010 at 32 weeks  . HPV in female   . HSV infection   . Hypotension   . Scoliosis     Patient Active Problem List   Diagnosis Date Noted  . Status post cesarean delivery 11/08/2019  . Umbilical cord prolapse 58/05/9832  . Premature rupture of membranes 10/30/2019  . Vaginal bleeding during pregnancy 10/20/2019  . Cervical cerclage suture present, antepartum 10/03/2019  . Short cervix 08/22/2019  . Current pregnancy with history of pre-term labor in first trimester 07/28/2019  . History of herpes genitalis 07/28/2019  . Herpes virus infection in mother during pregnancy, antepartum 07/28/2019  . Hyperemesis affecting pregnancy, antepartum 06/15/2019  . Hyperemesis 06/15/2019  . Supervision of high-risk pregnancy 05/24/2017  . Headache, migraine 01/23/2014  . 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 Clinical trial participant 11/25/2013  . Opioid dependence ([place]) 10/27/2013  . Myofascial pain 10/10/2013  . Continuous opioid  dependence ([place]) 09/22/2013  . Bipolar affective disorder ([place]) 08/19/2013  . Scoliosis 08/19/2013  . Lesion of vulva 08/02/2013  . HPV test positive 06/08/2013  . Cervical pain 04/03/2012    Past Surgical History:  Procedure Laterality Date  . CERVICAL CERCLAGE  08/31/2020  . CESAREAN SECTION N/A 11/06/2019   Procedure: CESAREAN SECTION;  Surgeon: Malachy Mood, MD;  Location: [facility] ORS;  Service: Obstetrics;  Laterality: N/A;  . INDUCED ABORTION      Prior to Admission medications   Medication Sig Start Date End Date Taking? Authorizing Provider  HYDROcodone-acetaminophen (NORCO/VICODIN) 5-325 MG tablet Take 1 tablet by mouth every 6 (six) hours as needed. Patient not taking: Reported on 01/02/2020 12/06/19   Malachy Mood, MD  lidocaine-prilocaine (EMLA) cream Apply 1 application topically as needed. Patient not taking: Reported on 01/02/2020 11/15/19   Malachy Mood, MD  albuterol (PROVENTIL HFA;VENTOLIN HFA) 108 [phone] Base) MCG/ACT inhaler Inhale 2 puffs into the lungs every 6 (six) hours as needed for wheezing or shortness of breath. 10/10/17 04/03/19  Fisher, Linden Dolin, PA-C    Allergies Amoxicillin, Bee venom, Coconut oil, Mushroom extract complex, and Other  Family History  Problem Relation Age of Onset  . Osteoarthritis Mother   . Asthma Mother   . Hypertension Mother   . Osteoarthritis Father   . Hypertension Father   . Osteoarthritis Maternal Grandmother   . Asthma Maternal Grandmother   . Cancer Maternal Grandmother        breast cancer  . Diabetes Maternal Grandmother   .  Heart failure Maternal Grandmother   . Hyperlipidemia Maternal Grandmother   . Hypertension Maternal Grandmother   . Migraines Maternal Grandmother   . Thyroid disease Maternal Grandmother   . Osteoarthritis Paternal Grandmother   . Asthma Paternal Grandmother   . Cancer Paternal Grandmother        stomach  . Diabetes Paternal Grandmother   . Heart failure Paternal Grandmother   .  Hyperlipidemia Paternal Grandmother   . Hypertension Paternal Grandmother   . Breast cancer Paternal Aunt   . Hypertension Sister     Social History Social History   Tobacco Use  . Smoking status: Never Smoker  . Smokeless tobacco: Never Used  Substance Use Topics  . Alcohol use: No  . Drug use: No    Review of Systems  Constitutional: Negative for fever. Eyes: Negative for visual changes. ENT: Negative for sore throat. Neck: No neck pain  Cardiovascular: Negative for chest pain. Respiratory: Negative for shortness of breath. Gastrointestinal: Negative for abdominal pain, vomiting or diarrhea. Genitourinary: Negative for dysuria. Musculoskeletal: Negative for back pain. + R knee pain Skin: Negative for rash. Neurological: Negative for headaches, weakness or numbness. Psych: No SI or HI  ____________________________________________   PHYSICAL EXAM:  VITAL SIGNS: ED Triage Vitals  Enc Vitals Group     BP 01/03/20 1944 106/71     Pulse Rate 01/03/20 1944 74     Resp 01/03/20 1944 18     Temp 01/03/20 1944 98.2 F (36.8 C)     Temp Source 01/03/20 1944 Oral     SpO2 01/03/20 1944 99 %     Weight 01/03/20 1944 198 lb (89.8 kg)     Height 01/03/20 1944 5\' 7"  (1.702 m)     Head Circumference --      Peak Flow --      Pain Score 01/03/20 1947 8     Pain Loc --      Pain Edu? --      Excl. in GC? --     Constitutional: Alert and oriented. Well appearing and in no apparent distress. HEENT:      Head: Normocephalic and atraumatic.         Eyes: Conjunctivae are normal. Sclera is non-icteric.       Mouth/Throat: Mucous membranes are moist.       Neck: Supple with no signs of meningismus. Cardiovascular: Regular rate and rhythm. No murmurs, gallops, or rubs. 2+ symmetrical distal pulses are present in all extremities.  Respiratory: Normal respiratory effort. Lungs are clear to auscultation bilaterally.  Gastrointestinal: Soft, non tender, and non distended, no  inguinal lymphadenopathy  Musculoskeletal: Mild tenderness to palpation over the proximal tibia, no swelling noted, no erythema, full painless ROM. No edema, cyanosis, or erythema of extremities. Neurologic: Normal speech and language. Face is symmetric. Moving all extremities. No gross focal neurologic deficits are appreciated. Skin: Skin is warm, dry and intact. No rash noted. Psychiatric: Mood and affect are normal. Speech and behavior are normal.  ____________________________________________   LABS (all labs ordered are listed, but only abnormal results are displayed)  Labs Reviewed - No data to display ____________________________________________  EKG  none  ____________________________________________  RADIOLOGY  I have personally reviewed the images performed during this visit and I agree with the Radiologist's read.   Interpretation by Radiologist:  01/05/20 Venous Img Lower Unilateral Right  Result Date: 01/03/2020 CLINICAL DATA:  Right lower extremity pain EXAM: RIGHT LOWER EXTREMITY VENOUS DOPPLER ULTRASOUND TECHNIQUE: Gray-scale sonography with  compression, as well as color and duplex ultrasound, were performed to evaluate the deep venous system(s) from the level of the common femoral vein through the popliteal and proximal calf veins. COMPARISON:  None. FINDINGS: VENOUS Normal compressibility of the common femoral, superficial femoral, and popliteal veins, as well as the visualized calf veins. Visualized portions of profunda femoral vein and great saphenous vein unremarkable. No filling defects to suggest DVT on grayscale or color Doppler imaging. Doppler waveforms show normal direction of venous flow, normal respiratory phasicity and response to augmentation. Limited views of the contralateral common femoral vein are unremarkable. OTHER None. Limitations: none IMPRESSION: No evidence of right lower extremity DVT. Electronically Signed   By: Charlett Nose M.D.   On: 01/03/2020 20:34     DG Knee Complete 4 Views Right  Result Date: 01/04/2020 CLINICAL DATA:  Pain, swelling EXAM: RIGHT KNEE - COMPLETE 4+ VIEW COMPARISON:  None. FINDINGS: No evidence of fracture, dislocation, or joint effusion. No evidence of arthropathy or other focal bone abnormality. Soft tissues are unremarkable. IMPRESSION: Negative. Electronically Signed   By: Charlett Nose M.D.   On: 01/04/2020 00:18     ____________________________________________   PROCEDURES  Procedure(s) performed: None Procedures Critical Care performed:  None ____________________________________________   INITIAL IMPRESSION / ASSESSMENT AND PLAN / ED COURSE  34 y.o. female status post C-section 8 weeks ago who presents for evaluation of R knee pain. Patient is well appearing, unremarkable with no swelling, pain with range of motion, no edema, no erythema.  Clinically no signs of septic joint, inflammatory joint, fracture, dislocation.  Extremities warm and well-perfused with strong distal pulses and brisk capillary refill.  Patient is able to bear weight.  Doppler ultrasound visualize and interpreted by me negative for DVT, confirmed by radiology.  X-ray of the knee visualized and interpreted by me as negative, confirmed by radiology. Recommended elevation, ice, tylenol/ ibuprofen for pain and follow up with PCP.  Old medical records reviewed.  Return to the emergency room for worsening pain or swelling.      _____________________________________________ Please note:  Patient was evaluated in Emergency Department today for the symptoms described in the history of present illness. Patient was evaluated in the context of the global COVID-19 pandemic, which necessitated consideration that the patient might be at risk for infection with the SARS-CoV-2 virus that causes COVID-19. Institutional protocols and algorithms that pertain to the evaluation of patients at risk for COVID-19 are in a state of rapid change based on information  released by regulatory bodies including the CDC and federal and state organizations. These policies and algorithms were followed during the patient's care in the ED.  Some ED evaluations and interventions may be delayed as a result of limited staffing during the pandemic.   [place] Controlled Substance Database was reviewed by me. ____________________________________________   FINAL CLINICAL IMPRESSION(S) / ED DIAGNOSES   Final diagnoses:  Acute pain of right knee      NEW MEDICATIONS STARTED DURING THIS VISIT:  ED Discharge Orders    None       Note:  This document was prepared using Dragon voice recognition software and may include unintentional dictation errors.    Don Perking, [place], MD 01/04/20 [id number]

## 2020-01-04 NOTE — Discharge Instructions (Addendum)
Elevate your leg, apply ice, take tylenol 650 mg every 4 hours for the pain. Follow up with your doctor in 2 days for re-evaluation. Return to the ER for worsening pain, swelling, chest pain, shortness of breath, or if your symptoms are not improving/ resolving in the next 3 days.

## 2020-01-04 NOTE — ED Notes (Signed)
X-ray at bedside

## 2020-01-06 ENCOUNTER — Other Ambulatory Visit
Admission: RE | Admit: 2020-01-06 | Discharge: 2020-01-06 | Disposition: A | Discharge status: Home / Self Care | Payer: Medicaid Other | Source: Ambulatory Visit | Attending: Obstetrics and Gynecology | Admitting: Obstetrics and Gynecology

## 2020-01-06 DIAGNOSIS — Z20822 Contact with and (suspected) exposure to covid-19: Secondary | ICD-10-CM | POA: Insufficient documentation

## 2020-01-06 DIAGNOSIS — Z01812 Encounter for preprocedural laboratory examination: Secondary | ICD-10-CM | POA: Insufficient documentation

## 2020-01-06 LAB — SARS CORONAVIRUS 2 (TAT 6-24 HRS): SARS Coronavirus 2: NEGATIVE

## 2020-01-10 ENCOUNTER — Encounter
Admission: RE | Disposition: A | Discharge status: Home / Self Care | Payer: Self-pay | Source: Home / Self Care | Attending: Obstetrics and Gynecology

## 2020-01-10 ENCOUNTER — Ambulatory Visit: Payer: Medicaid Other | Admitting: Anesthesiology

## 2020-01-10 ENCOUNTER — Encounter: Payer: Self-pay | Admitting: Obstetrics and Gynecology

## 2020-01-10 ENCOUNTER — Other Ambulatory Visit: Payer: Self-pay

## 2020-01-10 ENCOUNTER — Ambulatory Visit
Admission: RE | Admit: 2020-01-10 | Discharge: 2020-01-10 | Disposition: A | Discharge status: Home / Self Care | Payer: Medicaid Other | Attending: Obstetrics and Gynecology | Admitting: Obstetrics and Gynecology

## 2020-01-10 DIAGNOSIS — Z3009 Encounter for other general counseling and advice on contraception: Secondary | ICD-10-CM

## 2020-01-10 DIAGNOSIS — Z302 Encounter for sterilization: Secondary | ICD-10-CM | POA: Insufficient documentation

## 2020-01-10 DIAGNOSIS — Z9889 Other specified postprocedural states: Secondary | ICD-10-CM

## 2020-01-10 HISTORY — PX: LAPAROSCOPIC BILATERAL SALPINGECTOMY: SHX5889

## 2020-01-10 LAB — TYPE AND SCREEN
ABO/RH(D): A POS
Antibody Screen: NEGATIVE

## 2020-01-10 LAB — POCT PREGNANCY, URINE: Preg Test, Ur: NEGATIVE

## 2020-01-10 SURGERY — SALPINGECTOMY, BILATERAL, LAPAROSCOPIC
Anesthesia: General | Site: Abdomen | Laterality: Bilateral

## 2020-01-10 MED ORDER — FENTANYL CITRATE (PF) 100 MCG/2ML IJ SOLN
INTRAMUSCULAR | Status: DC | PRN
  Administered 2020-01-10 (×2): 50 ug via INTRAVENOUS

## 2020-01-10 MED ORDER — MIDAZOLAM HCL 2 MG/2ML IJ SOLN
INTRAMUSCULAR | Status: DC | PRN
  Administered 2020-01-10: 2 mg via INTRAVENOUS

## 2020-01-10 MED ORDER — FAMOTIDINE 20 MG PO TABS
20.0000 mg | ORAL_TABLET | Freq: Once | ORAL | Status: AC
  Administered 2020-01-10: 20 mg via ORAL

## 2020-01-10 MED ORDER — BUPIVACAINE HCL (PF) 0.5 % IJ SOLN
INTRAMUSCULAR | Status: AC
  Filled 2020-01-10: qty 30

## 2020-01-10 MED ORDER — ONDANSETRON HCL 4 MG/2ML IJ SOLN
INTRAMUSCULAR | Status: AC
  Filled 2020-01-10: qty 2

## 2020-01-10 MED ORDER — IBUPROFEN 600 MG PO TABS
600.0000 mg | ORAL_TABLET | Freq: Four times a day (QID) | ORAL | 3 refills | Status: DC | PRN
Start: 2020-01-10 — End: 2020-02-23

## 2020-01-10 MED ORDER — DEXAMETHASONE SODIUM PHOSPHATE 10 MG/ML IJ SOLN
INTRAMUSCULAR | Status: AC
  Filled 2020-01-10: qty 1

## 2020-01-10 MED ORDER — ROCURONIUM BROMIDE 100 MG/10ML IV SOLN
INTRAVENOUS | Status: DC | PRN
  Administered 2020-01-10: 50 mg via INTRAVENOUS

## 2020-01-10 MED ORDER — OXYCODONE HCL 5 MG/5ML PO SOLN: 5.0000 mg | Freq: Once | ORAL | Status: AC | PRN

## 2020-01-10 MED ORDER — ROCURONIUM BROMIDE 10 MG/ML (PF) SYRINGE
PREFILLED_SYRINGE | INTRAVENOUS | Status: AC
  Filled 2020-01-10: qty 10

## 2020-01-10 MED ORDER — OXYCODONE-ACETAMINOPHEN 5-325 MG PO TABS: 1.0000 | ORAL_TABLET | ORAL | 0 refills | Status: DC | PRN

## 2020-01-10 MED ORDER — FENTANYL CITRATE (PF) 100 MCG/2ML IJ SOLN
25.0000 ug | INTRAMUSCULAR | Status: DC | PRN
  Administered 2020-01-10 (×2): 50 ug via INTRAVENOUS

## 2020-01-10 MED ORDER — DEXAMETHASONE SODIUM PHOSPHATE 10 MG/ML IJ SOLN
INTRAMUSCULAR | Status: DC | PRN
  Administered 2020-01-10: 10 mg via INTRAVENOUS

## 2020-01-10 MED ORDER — FAMOTIDINE 20 MG PO TABS
ORAL_TABLET | ORAL | Status: AC
  Filled 2020-01-10: qty 1

## 2020-01-10 MED ORDER — PROMETHAZINE HCL 25 MG/ML IJ SOLN: 6.2500 mg | INTRAMUSCULAR | Status: DC | PRN

## 2020-01-10 MED ORDER — SUGAMMADEX SODIUM 500 MG/5ML IV SOLN
INTRAVENOUS | Status: DC | PRN
  Administered 2020-01-10: 500 mg via INTRAVENOUS

## 2020-01-10 MED ORDER — LACTATED RINGERS IV SOLN: INTRAVENOUS | Status: DC

## 2020-01-10 MED ORDER — BUPIVACAINE HCL 0.5 % IJ SOLN
INTRAMUSCULAR | Status: DC | PRN
  Administered 2020-01-10: 8 mL

## 2020-01-10 MED ORDER — LIDOCAINE HCL (PF) 2 % IJ SOLN
INTRAMUSCULAR | Status: AC
  Filled 2020-01-10: qty 5

## 2020-01-10 MED ORDER — PROPOFOL 10 MG/ML IV BOLUS
INTRAVENOUS | Status: AC
  Filled 2020-01-10: qty 20

## 2020-01-10 MED ORDER — PROPOFOL 10 MG/ML IV BOLUS
INTRAVENOUS | Status: DC | PRN
  Administered 2020-01-10: 170 mg via INTRAVENOUS

## 2020-01-10 MED ORDER — MIDAZOLAM HCL 2 MG/2ML IJ SOLN
INTRAMUSCULAR | Status: AC
  Filled 2020-01-10: qty 2

## 2020-01-10 MED ORDER — OXYCODONE HCL 5 MG PO TABS
ORAL_TABLET | ORAL | Status: AC
  Filled 2020-01-10: qty 1

## 2020-01-10 MED ORDER — OXYCODONE HCL 5 MG PO TABS
5.0000 mg | ORAL_TABLET | Freq: Once | ORAL | Status: AC | PRN
  Administered 2020-01-10: 5 mg via ORAL

## 2020-01-10 MED ORDER — LIDOCAINE HCL (CARDIAC) PF 100 MG/5ML IV SOSY
PREFILLED_SYRINGE | INTRAVENOUS | Status: DC | PRN
  Administered 2020-01-10: 100 mg via INTRAVENOUS

## 2020-01-10 MED ORDER — FENTANYL CITRATE (PF) 100 MCG/2ML IJ SOLN
INTRAMUSCULAR | Status: AC
  Filled 2020-01-10: qty 2

## 2020-01-10 MED ORDER — ONDANSETRON HCL 4 MG/2ML IJ SOLN
INTRAMUSCULAR | Status: DC | PRN
  Administered 2020-01-10: 4 mg via INTRAVENOUS

## 2020-01-10 MED ORDER — MEPERIDINE HCL 50 MG/ML IJ SOLN: 6.2500 mg | INTRAMUSCULAR | Status: DC | PRN

## 2020-01-10 SURGICAL SUPPLY — 37 items
ANCHOR TIS RET SYS 235ML (MISCELLANEOUS) IMPLANT
BAG URINE DRAIN 2000ML AR STRL (UROLOGICAL SUPPLIES) ×3 IMPLANT
BLADE SURG SZ11 CARB STEEL (BLADE) ×3 IMPLANT
CANISTER SUCT 1200ML W/VALVE (MISCELLANEOUS) ×3 IMPLANT
CATH FOLEY 2WAY  5CC 16FR (CATHETERS) ×2
CATH URTH 16FR FL 2W BLN LF (CATHETERS) ×1 IMPLANT
CHLORAPREP W/TINT 26 (MISCELLANEOUS) ×3 IMPLANT
COVER WAND RF STERILE (DRAPES) ×3 IMPLANT
DERMABOND ADVANCED (GAUZE/BANDAGES/DRESSINGS) ×2
DERMABOND ADVANCED .7 DNX12 (GAUZE/BANDAGES/DRESSINGS) ×1 IMPLANT
GLOVE BIO SURGEON STRL SZ7 (GLOVE) ×3 IMPLANT
GLOVE INDICATOR 7.5 STRL GRN (GLOVE) ×3 IMPLANT
GOWN STRL REUS W/ TWL LRG LVL3 (GOWN DISPOSABLE) ×2 IMPLANT
GOWN STRL REUS W/ TWL XL LVL3 (GOWN DISPOSABLE) IMPLANT
GOWN STRL REUS W/TWL LRG LVL3 (GOWN DISPOSABLE) ×4
GOWN STRL REUS W/TWL XL LVL3 (GOWN DISPOSABLE)
GRASPER SUT TROCAR 14GX15 (MISCELLANEOUS) IMPLANT
IRRIGATION STRYKERFLOW (MISCELLANEOUS) IMPLANT
IRRIGATOR STRYKERFLOW (MISCELLANEOUS)
IV LACTATED RINGERS 1000ML (IV SOLUTION) ×3 IMPLANT
KIT PINK PAD W/HEAD ARE REST (MISCELLANEOUS) ×3
KIT PINK PAD W/HEAD ARM REST (MISCELLANEOUS) ×1 IMPLANT
KIT TURNOVER CYSTO (KITS) ×3 IMPLANT
LABEL OR SOLS (LABEL) ×3 IMPLANT
LIGASURE VESSEL 5MM BLUNT TIP (ELECTROSURGICAL) ×3 IMPLANT
NS IRRIG 500ML POUR BTL (IV SOLUTION) ×3 IMPLANT
PACK GYN LAPAROSCOPIC (MISCELLANEOUS) ×3 IMPLANT
PAD OB MATERNITY 4.3X12.25 (PERSONAL CARE ITEMS) ×3 IMPLANT
PAD PREP 24X41 OB/GYN DISP (PERSONAL CARE ITEMS) ×3 IMPLANT
SCISSORS METZENBAUM CVD 33 (INSTRUMENTS) IMPLANT
SET TUBE SMOKE EVAC HIGH FLOW (TUBING) ×3 IMPLANT
SHEARS HARMONIC ACE PLUS 36CM (ENDOMECHANICALS) IMPLANT
SLEEVE ENDOPATH XCEL 5M (ENDOMECHANICALS) ×6 IMPLANT
SUT MNCRL AB 4-0 PS2 18 (SUTURE) ×3 IMPLANT
SUT VIC AB 2-0 UR6 27 (SUTURE) ×3 IMPLANT
TROCAR ENDO BLADELESS 11MM (ENDOMECHANICALS) IMPLANT
TROCAR XCEL NON-BLD 5MMX100MML (ENDOMECHANICALS) ×3 IMPLANT

## 2020-01-10 NOTE — H&P (Signed)
Date of Initial H&P: 12/26/2019  History reviewed, patient examined, no change in status, stable for surgery.

## 2020-01-10 NOTE — Anesthesia Preprocedure Evaluation (Addendum)
Anesthesia Evaluation  Patient identified by MRN, date of birth, ID band Patient awake    Reviewed: Allergy & Precautions, H&P , NPO status , Patient's Chart, lab work & pertinent test results  History of Anesthesia Complications Negative for: history of anesthetic complications  Airway Mallampati: III  TM Distance: >3 FB Neck ROM: Full    Dental no notable dental hx.    Pulmonary neg pulmonary ROS, neg sleep apnea, neg COPD,    breath sounds clear to auscultation- rhonchi (-) wheezing      Cardiovascular Exercise Tolerance: Good (-) hypertension(-) CAD, (-) Past MI, (-) Cardiac Stents and (-) CABG negative cardio ROS   Rhythm:Regular Rate:Normal - Systolic murmurs and - Diastolic murmurs    Neuro/Psych  Headaches, neg Seizures PSYCHIATRIC DISORDERS Anxiety Bipolar Disorder    GI/Hepatic negative GI ROS, Neg liver ROS,   Endo/Other  negative endocrine ROSneg diabetes  Renal/GU      Musculoskeletal negative musculoskeletal ROS (+)   Abdominal (+) + obese,   Peds  Hematology  (+) anemia ,   Anesthesia Other Findings Past Medical History: No date: Anemia No date: Anxiety No date: ASCUS favor benign No date: History of preterm delivery     Comment:  with spontaneous labor in 2010 and 2015 No date: History of preterm premature rupture of membranes (PPROM)     Comment:  2010 at 32 weeks No date: HPV in female No date: HSV infection No date: Hypotension No date: Scoliosis  Past Surgical History: 08/31/2020: CERVICAL CERCLAGE 11/06/2019: CESAREAN SECTION; N/A     Comment:  Procedure: CESAREAN SECTION;  Surgeon: Vena Austria, MD;  Location: [facility] ORS;  Service: Obstetrics;                Laterality: N/A; No date: INDUCED ABORTION     Reproductive/Obstetrics negative OB ROS                            Anesthesia Physical Anesthesia Plan  ASA: II  Anesthesia Plan:  General ETT   Post-op Pain Management:    Induction: Intravenous  PONV Risk Score and Plan: 2 and Ondansetron, Dexamethasone, Midazolam and Treatment may vary due to age or medical condition  Airway Management Planned: Oral ETT  Additional Equipment:   Intra-op Plan:   Post-operative Plan: Extubation in OR  Informed Consent: I have reviewed the patients History and Physical, chart, labs and discussed the procedure including the risks, benefits and alternatives for the proposed anesthesia with the patient or authorized representative who has indicated his/her understanding and acceptance.     Dental Advisory Given  Plan Discussed with: Anesthesiologist  Anesthesia Plan Comments:        Anesthesia Quick Evaluation

## 2020-01-10 NOTE — Op Note (Signed)
Preoperative Diagnosis: 1) 34 y.o. with undesired fertility  Postoperative Diagnosis: 1) 34 y.o. with undesired fertility  Operation Performed: Laparoscopic bilateral salpingectomy  Indication: 34 y.o. [id number]  with undesired fertility, desires permanent sterilization.  Other reversible forms of contraception were discussed with patient; she declines all other modalities. Permanent nature of as well as associated risks of the procedure discussed with patient including but not limited to: risk of regret, permanence of method, bleeding, infection, injury to surrounding organs and need for additional procedures.  F  Surgeon: Vena Austria, MD  Anesthesia: General  Preoperative Antibiotics: none  Estimated Blood Loss: 5 mL  IV Fluids:  Urine Output::  Drains or Tubes: none  Implants: none  Specimens Removed: Bilateral fallopian tubes  Complications: none  Intraoperative Findings: Normal tubes, ovaries, and uterus.    Patient Condition: stable  Procedure in Detail:  Patient was taken to the operating room where she was administered general anesthesia.  She was positioned in the dorsal lithotomy position utilizing Allen stirups, prepped and draped in the usual sterile fashion.  Prior to proceeding with procedure a time out was performed.  Attention was turned to the patient's pelvis.  A red rubber catheter was used to empty the patient's bladder.  An operative speculum was placed to allow visualization of the cervix.  The anterior lip of the cervix was grasped with a single tooth tenaculum, and a Hulka tenaculum was placed to allow manipulation of the uterus.  The operative speculum and single tooth tenaculum were then removed.  Attention was turned to the patient's abdomen.  The umbilicus was infiltrated with 1% Sensorcaine, before making a stab incision using an 11 blade scalpel.  A 42mm Excel trocar was then used to gain direct entry into the peritoneal cavity utilizing  the camera to visualize progress of the trocar during placement.  Once peritoneal entry had been achieved, insufflation was started and pneumoperitoneum established at a pressure of .   General inspection of the abdomen revealed the above noted findings.  A spot 2cm midline above the pubic symphysis and in the left lower quadrant were injected with 1% Sensorcaine and a stab incisions were made using an 11 blade scalpel.  Two additional 80mm Excel trocars were placed suprapubic through these incision under direct visualization.  The right tube was identified and grasped at the fimbriated end and transected from its attachments to the ovary, mesosalpinx, and uterus using a 18mm Ligasure.  The left tube was resected in a similar fashion.  Both tubes were removed through the suprapubic 44mm Excel trocar.  Pedicles were inspected and noted to be hemostatic.   Pneumoperitoneum was evacuated.  The trocars were removed.  All trocar sites were then dressed with surgical skin glue.  The Hulka tenaculum was removed.  Sponge needle and instrument counts were correct time two.  The patient tolerated the procedure well and was taken to the recovery room in stable condition.

## 2020-01-10 NOTE — Discharge Instructions (Signed)

## 2020-01-10 NOTE — Anesthesia Procedure Notes (Signed)
Procedure Name: Intubation Date/Time: 01/10/2020 2:13 PM Performed by: Rona Ravens, CRNA Pre-anesthesia Checklist: Patient identified, Emergency Drugs available, Suction available, Patient being monitored and Timeout performed Patient Re-evaluated:Patient Re-evaluated prior to induction Oxygen Delivery Method: Circle system utilized Preoxygenation: Pre-oxygenation with 100% oxygen Induction Type: IV induction Ventilation: Mask ventilation without difficulty Laryngoscope Size: Mac and 3 Grade View: Grade II Tube type: Oral Tube size: 7.0 mm Number of attempts: 1 Airway Equipment and Method: Stylet Placement Confirmation: ETT inserted through vocal cords under direct vision,  positive ETCO2,  CO2 detector and breath sounds checked- equal and bilateral Secured at: 22 cm Tube secured with: Tape Dental Injury: Teeth and Oropharynx as per pre-operative assessment

## 2020-01-10 NOTE — Transfer of Care (Signed)
Immediate Anesthesia Transfer of Care Note  Patient: MUREL WIGLE  Procedure(s) Performed: LAPAROSCOPIC BILATERAL SALPINGECTOMY (Bilateral Abdomen)  Patient Location: PACU  Anesthesia Type:General  Level of Consciousness: awake, alert  and oriented  Airway & Oxygen Therapy: Patient Spontanous Breathing and Patient connected to face mask oxygen  Post-op Assessment: Report given to RN and Post -op Vital signs reviewed and stable  Post vital signs: Reviewed and stable  Last Vitals:  Vitals Value Taken Time  BP 131/71 01/10/20 1504  Temp 36.4 C 01/10/20 1504  Pulse 67 01/10/20 1506  Resp    SpO2 100 % 01/10/20 1506  Vitals shown include unvalidated device data.  Last Pain:  Vitals:   01/10/20 1504  TempSrc:   PainSc: 7          Complications: No apparent anesthesia complications

## 2020-01-11 NOTE — Anesthesia Postprocedure Evaluation (Signed)
Anesthesia Post Note  Patient: Lisa Rowland  Procedure(s) Performed: LAPAROSCOPIC BILATERAL SALPINGECTOMY (Bilateral Abdomen)  Patient location during evaluation: PACU Anesthesia Type: General Level of consciousness: awake and alert and oriented Pain management: pain level controlled Vital Signs Assessment: post-procedure vital signs reviewed and stable Respiratory status: spontaneous breathing, nonlabored ventilation and respiratory function stable Cardiovascular status: blood pressure returned to baseline and stable Postop Assessment: no signs of nausea or vomiting Anesthetic complications: no     Last Vitals:  Vitals:   01/10/20 1544 01/10/20 1612  BP: 124/73 109/63  Pulse: 65 78  Resp: 18 18  Temp: 36.5 C   SpO2: 98% 100%    Last Pain:  Vitals:   01/10/20 1612  TempSrc:   PainSc: 3                  Emoree Sasaki

## 2020-01-12 LAB — SURGICAL PATHOLOGY

## 2020-01-13 DIAGNOSIS — Z3009 Encounter for other general counseling and advice on contraception: Secondary | ICD-10-CM

## 2020-01-17 ENCOUNTER — Ambulatory Visit: Payer: Medicaid Other | Admitting: Obstetrics and Gynecology

## 2020-02-23 ENCOUNTER — Ambulatory Visit (INDEPENDENT_AMBULATORY_CARE_PROVIDER_SITE_OTHER): Payer: Medicaid Other | Admitting: Obstetrics and Gynecology

## 2020-02-23 ENCOUNTER — Other Ambulatory Visit: Payer: Self-pay

## 2020-02-23 ENCOUNTER — Encounter: Payer: Self-pay | Admitting: Obstetrics and Gynecology

## 2020-02-23 VITALS — BP 110/72 | HR 66 | Wt 190.0 lb

## 2020-02-23 DIAGNOSIS — Z4889 Encounter for other specified surgical aftercare: Secondary | ICD-10-CM

## 2020-02-23 NOTE — Progress Notes (Signed)
Postoperative Follow-up Patient presents post op from laparoscopic bilateral salpingectomy 6weeks ago for requested sterilization.  Subjective: Patient reports marked improvement in her preop symptoms. Eating a regular diet without difficulty. The patient is not having any pain.  Activity: normal activities of daily living.Still feels she has prolapse symptoms although slightly improved since delivery and 6 week postpartum visit.  Objective: Blood pressure 110/72, pulse 66, weight 190 lb (86.2 kg), last menstrual period 02/19/2020, not currently breastfeeding.  General: NAD Pulmonary: no increased work of breathing Abdomen: soft, non-tender, non-distended, incision(s) D/C/I GU: normal external female genitalia normal cervix, no CMT, uterus normal in shape and contour, no adnexal tenderness or masses Extremities: no edema Neurologic: normal gait    Admission on 01/10/2020, Discharged on 01/10/2020  Component Date Value Ref Range Status  . ABO/RH(D) 01/10/2020 A POS   Final  . Antibody Screen 01/10/2020 NEG   Final  . Sample Expiration 01/10/2020    Final                   Value:01/13/2020,2359 Performed at [facility], [address] [place], [place] [address]   . Preg Test, Ur 01/10/2020 NEGATIVE  NEGATIVE Final   Comment:        THE SENSITIVITY OF THIS METHODOLOGY IS >24 mIU/mL   . SURGICAL PATHOLOGY 01/10/2020    Final-Edited                   Value:SURGICAL PATHOLOGY CASE: [id number] PATIENT: Lisa Rowland Surgical Pathology Report     Specimen Submitted: A. Fallopian tubes, right and left  Clinical History: Desires surgical sterilization      DIAGNOSIS: A.  FALLOPIAN TUBES, RIGHT AND LEFT; STERILIZATION: - FRAGMENTS OF FALLOPIAN TUBE AND FIMBRIA. - BENIGN PARATUBAL CYST. - NEGATIVE FOR ATYPIA AND MALIGNANCY.  GROSS DESCRIPTION: A. Labeled: Right and left fallopian tubes Received: Formalin Tissue fragment(s): Multiple Size:  Aggregate, 4.4 x 4.0 x 0.7 cm Description: Received are markedly disrupted and unoriented fragments of tan-purple fallopian tube.  Fimbria are attached to one portion of fallopian tube.  Additional, freely floating possible fimbria are identified.  Attached to the external surface of the fallopian tube fragments are multiple smooth-walled, unilocular paratubal cysts ranging from 0.3 to 0.9 cm in greatest dimension and containing clear fluid. The remaining external surface is                          slightly disrupted and otherwise tan-purple and smooth.  Sectioning displays a pinpoint, grossly unremarkable lumen.  Representative sections are submitted as follows: 1 - fallopian tube fragment with attached, longitudinally sectioned fimbria and cross-sections with paratubal cyst 2 - freely floating, possible fimbria 3 - cross-sections of additional, freely floating and disrupted fallopian tube fragments and paratubal cysts     Final Diagnosis performed by Quay Burow, MD.   Electronically signed 01/12/2020 9:48:22AM The electronic signature indicates that the named Attending Pathologist has evaluated the specimen Technical component performed at [facility], [address], [place], [place] [address] Lab: [phone] Dir: Rush Farmer, MD, MMM  Professional component performed at [facility], [facility], [place], [place], [place] [address] Lab: [phone] Dir: Dellia Nims. Reuel Derby, MD     Assessment: 34 y.o. s/p laparoscopic bilateral salpingectomy stable  Plan: Patient has done well after surgery with no apparent complications.  I have discussed the post-operative course to date, and the expected progress moving forward.  The patient  understands what complications to be concerned about.  I will see the patient in routine follow up, or sooner if needed.    Activity plan: No restriction.  Still worried about uterine prolapse postpartum, will re-asses at time of  annual 1 year post delivery  Vena Austria, MD, [facility], [facility] 02/23/2020, 10:32 AM

## 2020-07-03 ENCOUNTER — Ambulatory Visit (INDEPENDENT_AMBULATORY_CARE_PROVIDER_SITE_OTHER): Payer: Medicaid Other | Admitting: Obstetrics

## 2020-07-03 ENCOUNTER — Other Ambulatory Visit: Payer: Self-pay

## 2020-07-03 ENCOUNTER — Encounter: Payer: Self-pay | Admitting: Obstetrics

## 2020-07-03 VITALS — BP 122/74 | Ht 67.0 in | Wt 192.0 lb

## 2020-07-03 DIAGNOSIS — N393 Stress incontinence (female) (male): Secondary | ICD-10-CM | POA: Diagnosis not present

## 2020-07-03 DIAGNOSIS — N921 Excessive and frequent menstruation with irregular cycle: Secondary | ICD-10-CM

## 2020-07-03 MED ORDER — NORETHINDRONE ACETATE 5 MG PO TABS: 5.0000 mg | ORAL_TABLET | Freq: Every day | ORAL | 0 refills | Status: DC

## 2020-07-03 NOTE — Progress Notes (Signed)
Paitient is a 34 y.o. [id number] who LMP was Patient's last menstrual period was 06/19/2020., presents today for a problem visit.  She complains of metrorrhagia (no pattern) that  began several days ago and its severity is described as severe. She had a BTL performed in April of this year, several months after a CS. Prior to having a BTL, she had used Depoproverl for to 28 days and they are associated with severe menstrual cramping.  She has used the following for attempts at control: maxi pad. The patient is sexually active. She currently uses Female sterilization: Past: yes for contraception. She began bleeding heavily 06/19/2020, two weeks ago, and this lasted for one day, although the flow was heavy. She then began another bleedin yesterday, and she describes this as very heavy, and accompanied by lower back pain and cramping. She has soaked through several pads this morning.  She would like to have this evalauted. Shenay also reports that she feels like her cervix "hangs out" of her when she stands up. She has complained of this  Before, and had been instructed to allow sufficient time post delivery in February and BTL in April. She still thinks that her uterus is prolapsed and wants this evaluated. She has taken some limited Ibuprofen and Midol to treat the dysmenorrhea, but complains that this is not adequately helping.   LMP: Patient's last menstrual period was 06/19/2020. Menarche:11 Shortest Interval: not applicable Longest Interval: 28  days Duration of flow: 7 days Heavy Menses: yes Clots: no Intermenstrual Bleeding: yes Postcoital Bleeding: no Dysmenorrhea: yes  Limited physical Appearance: patient is alert, in NAD Lungs: CTA bilaterally, no wheezing, or cough Heart: RRR, no murmurs, chest pain  Abdomen: soft, non tender, + BS Pelvic: normal mons, external genitalia, no rashes, irritation, lesions noted.Hair normally distributed Vagina: moderate vaginal bleeding noted, no clots. No  visible cystocele noted with bearing down. Cervix appears closed, no CMT on exam. Cervix is palpated just behind the symphysis pubis Bimanual: uterus is anteverted, non enlarged, no adnexal masses palpated.  A: Irregular menses, heavy flow, dysmenorrhea P:  Discussed her previous use of Depo and the cycle /bleeding control that had, which is no longer the case as she has had a BTL. As she does want to discuss her POP with a provider, will treat the heavier flow with Ayegestin x 10 days, and then make an appointment with [facility] for a consultation on her complaint of organ prolapse. Advised to start 600-8-- Ibuprofen po now and repeat in 6-8 hours x three dosings to help with her cramping. Pelvic ultrasound ordered - results to Dr. Bonney Aid for review.  Mirna Mires, CNM  07/03/2020 12:35 PM \

## 2020-07-04 LAB — CBC WITH DIFFERENTIAL
Basophils Absolute: 0.1 10*3/uL (ref 0.0–0.2)
Basos: 1 %
EOS (ABSOLUTE): 0.1 10*3/uL (ref 0.0–0.4)
Eos: 2 %
Hematocrit: 37.8 % (ref 34.0–46.6)
Hemoglobin: 12.1 g/dL (ref 11.1–15.9)
Immature Grans (Abs): 0 10*3/uL (ref 0.0–0.1)
Immature Granulocytes: 0 %
Lymphocytes Absolute: 2.4 10*3/uL (ref 0.7–3.1)
Lymphs: 39 %
MCH: 27.7 pg (ref 26.6–33.0)
MCHC: 32 g/dL (ref 31.5–35.7)
MCV: 87 fL (ref 79–97)
Monocytes Absolute: 0.4 10*3/uL (ref 0.1–0.9)
Monocytes: 6 %
Neutrophils Absolute: 3.2 10*3/uL (ref 1.4–7.0)
Neutrophils: 52 %
RBC: 4.37 x10E6/uL (ref 3.77–5.28)
RDW: 12.7 % (ref 11.7–15.4)
WBC: 6.1 10*3/uL (ref 3.4–10.8)

## 2020-07-23 ENCOUNTER — Ambulatory Visit: Payer: Medicaid Other

## 2020-07-23 ENCOUNTER — Ambulatory Visit: Payer: Medicaid Other | Admitting: Obstetrics and Gynecology

## 2020-09-13 ENCOUNTER — Encounter: Payer: Self-pay | Admitting: *Deleted

## 2020-09-13 ENCOUNTER — Other Ambulatory Visit: Payer: Self-pay

## 2020-09-13 ENCOUNTER — Emergency Department
Admission: EM | Admit: 2020-09-13 | Discharge: 2020-09-13 | Disposition: A | Discharge status: Home / Self Care | Payer: Medicaid Other | Attending: Emergency Medicine | Admitting: Emergency Medicine

## 2020-09-13 ENCOUNTER — Emergency Department: Payer: Medicaid Other

## 2020-09-13 DIAGNOSIS — R0981 Nasal congestion: Secondary | ICD-10-CM | POA: Diagnosis not present

## 2020-09-13 DIAGNOSIS — R059 Cough, unspecified: Secondary | ICD-10-CM | POA: Diagnosis not present

## 2020-09-13 DIAGNOSIS — Z5321 Procedure and treatment not carried out due to patient leaving prior to being seen by health care provider: Secondary | ICD-10-CM | POA: Diagnosis not present

## 2020-09-13 DIAGNOSIS — Z20822 Contact with and (suspected) exposure to covid-19: Secondary | ICD-10-CM | POA: Diagnosis not present

## 2020-09-13 LAB — POC SARS CORONAVIRUS 2 AG: SARS Coronavirus 2 Ag: NEGATIVE

## 2020-09-13 NOTE — ED Triage Notes (Signed)
Pt reports since 12/23 having nonprod cough and congestion; st recent neg COVID testing but symptoms persist

## 2020-09-16 ENCOUNTER — Emergency Department
Admission: EM | Admit: 2020-09-16 | Discharge: 2020-09-16 | Disposition: A | Discharge status: Home / Self Care | Payer: Medicaid Other | Attending: Emergency Medicine | Admitting: Emergency Medicine

## 2020-09-16 DIAGNOSIS — Z5321 Procedure and treatment not carried out due to patient leaving prior to being seen by health care provider: Secondary | ICD-10-CM | POA: Diagnosis not present

## 2020-09-16 DIAGNOSIS — M791 Myalgia, unspecified site: Secondary | ICD-10-CM | POA: Insufficient documentation

## 2020-09-16 DIAGNOSIS — R6883 Chills (without fever): Secondary | ICD-10-CM | POA: Diagnosis not present

## 2020-09-16 NOTE — ED Triage Notes (Signed)
Pt in via EMS from home with c/o bodyaches and chills. Pt was seen here for the same on the 30th but tested negative for covid and wants another test.  99% RA, HR 94, 22RR, 134/64

## 2020-10-17 ENCOUNTER — Telehealth: Payer: Self-pay

## 2020-10-17 NOTE — Telephone Encounter (Signed)
Pt calling; wants rx for depo as she is still having problems with her periods. [phone]

## 2020-10-18 ENCOUNTER — Other Ambulatory Visit: Payer: Self-pay | Admitting: Obstetrics

## 2020-10-18 DIAGNOSIS — N921 Excessive and frequent menstruation with irregular cycle: Secondary | ICD-10-CM

## 2020-10-18 MED ORDER — MEDROXYPROGESTERONE ACETATE 150 MG/ML IM SUSP: 150.0000 mg | Freq: Once | INTRAMUSCULAR | Status: DC

## 2020-10-18 NOTE — Telephone Encounter (Signed)
Patient called to discuss her request for Depo pPvera. When last seen in October of 2021 she was over 6 months post BTL and repeat CS. Her heavy bleeding had become bothersome. Had an annual Gyn physical, and discussed options for addressing the irregular bleeding pattern and treatment. She was to have a pelvic ultrasound and appointment with Dr. Bonney Aid. Today, patient expresses that no one called her to set up the ultrasound and she wants to start with Depo shots to address the bleeding. Patient was hostile in tone and declined any follow up visits for ultrasound or MD visit.  Attempted to clarify our previous POC as documented in the chart, and she became angry in tone.  Ultimately agreed to a trial of Depo, and order placed  for her to start Medroxyprogesterone 150 mg IM. Scheduling otified for an RN visit tomorrow to administer first dose. She is aware that she must pick up the medication and bring it to the office, and that she needs a Nurse visit scheduled each time.  Mirna Mires, CNM  10/18/2020 6:18 PM

## 2020-10-19 ENCOUNTER — Other Ambulatory Visit: Payer: Self-pay

## 2020-10-19 ENCOUNTER — Ambulatory Visit (INDEPENDENT_AMBULATORY_CARE_PROVIDER_SITE_OTHER): Payer: Medicaid Other

## 2020-10-19 DIAGNOSIS — Z30013 Encounter for initial prescription of injectable contraceptive: Secondary | ICD-10-CM | POA: Diagnosis not present

## 2020-10-19 MED ORDER — MEDROXYPROGESTERONE ACETATE 150 MG/ML IM SUSP
150.0000 mg | Freq: Once | INTRAMUSCULAR | Status: AC
  Administered 2020-10-19: 150 mg via INTRAMUSCULAR

## 2021-01-16 ENCOUNTER — Other Ambulatory Visit: Payer: Self-pay

## 2021-01-16 ENCOUNTER — Ambulatory Visit: Payer: Medicaid Other

## 2021-01-16 MED ORDER — MEDROXYPROGESTERONE ACETATE 150 MG/ML IM SUSP: 150.0000 mg | INTRAMUSCULAR | 0 refills | Status: DC

## 2021-01-16 NOTE — Telephone Encounter (Signed)
Pt came in but did not know what the appt was for; MH scheduled pt for annual and notified me refill was needed.  Refill eRx'd.

## 2021-01-18 ENCOUNTER — Ambulatory Visit: Payer: Medicaid Other | Admitting: Obstetrics

## 2021-01-18 ENCOUNTER — Other Ambulatory Visit: Payer: Self-pay

## 2021-01-18 ENCOUNTER — Ambulatory Visit (LOCAL_COMMUNITY_HEALTH_CENTER): Payer: Medicaid Other

## 2021-01-18 DIAGNOSIS — Z111 Encounter for screening for respiratory tuberculosis: Secondary | ICD-10-CM

## 2021-01-21 ENCOUNTER — Ambulatory Visit (LOCAL_COMMUNITY_HEALTH_CENTER): Payer: Medicaid Other

## 2021-01-21 DIAGNOSIS — Z111 Encounter for screening for respiratory tuberculosis: Secondary | ICD-10-CM

## 2021-01-21 LAB — TB SKIN TEST
Induration: 0 mm
TB Skin Test: NEGATIVE

## 2021-01-29 ENCOUNTER — Emergency Department: Payer: Medicaid Other

## 2021-01-29 ENCOUNTER — Emergency Department
Admission: EM | Admit: 2021-01-29 | Discharge: 2021-01-29 | Disposition: A | Discharge status: Home / Self Care | Payer: Medicaid Other | Attending: Emergency Medicine | Admitting: Emergency Medicine

## 2021-01-29 ENCOUNTER — Other Ambulatory Visit: Payer: Self-pay

## 2021-01-29 DIAGNOSIS — M545 Low back pain, unspecified: Secondary | ICD-10-CM | POA: Insufficient documentation

## 2021-01-29 DIAGNOSIS — N8300 Follicular cyst of ovary, unspecified side: Secondary | ICD-10-CM | POA: Diagnosis not present

## 2021-01-29 DIAGNOSIS — R600 Localized edema: Secondary | ICD-10-CM | POA: Diagnosis not present

## 2021-01-29 DIAGNOSIS — R609 Edema, unspecified: Secondary | ICD-10-CM

## 2021-01-29 DIAGNOSIS — R102 Pelvic and perineal pain: Secondary | ICD-10-CM | POA: Diagnosis present

## 2021-01-29 DIAGNOSIS — R079 Chest pain, unspecified: Secondary | ICD-10-CM | POA: Diagnosis not present

## 2021-01-29 LAB — URINALYSIS, COMPLETE (UACMP) WITH MICROSCOPIC
Bilirubin Urine: NEGATIVE
Glucose, UA: NEGATIVE mg/dL
Hgb urine dipstick: NEGATIVE
Ketones, ur: NEGATIVE mg/dL
Nitrite: NEGATIVE
Protein, ur: NEGATIVE mg/dL
Specific Gravity, Urine: 1.029 (ref 1.005–1.030)
pH: 5 (ref 5.0–8.0)

## 2021-01-29 LAB — CBC
HCT: 39.9 % (ref 36.0–46.0)
Hemoglobin: 12.8 g/dL (ref 12.0–15.0)
MCH: 27.8 pg (ref 26.0–34.0)
MCHC: 32.1 g/dL (ref 30.0–36.0)
MCV: 86.6 fL (ref 80.0–100.0)
Platelets: 272 10*3/uL (ref 150–400)
RBC: 4.61 MIL/uL (ref 3.87–5.11)
RDW: 13.8 % (ref 11.5–15.5)
WBC: 5.7 10*3/uL (ref 4.0–10.5)
nRBC: 0 % (ref 0.0–0.2)

## 2021-01-29 LAB — COMPREHENSIVE METABOLIC PANEL
ALT: 9 U/L (ref 0–44)
AST: 12 U/L — ABNORMAL LOW (ref 15–41)
Albumin: 3.9 g/dL (ref 3.5–5.0)
Alkaline Phosphatase: 42 U/L (ref 38–126)
Anion gap: 7 (ref 5–15)
BUN: 9 mg/dL (ref 6–20)
CO2: 23 mmol/L (ref 22–32)
Calcium: 8.7 mg/dL — ABNORMAL LOW (ref 8.9–10.3)
Chloride: 107 mmol/L (ref 98–111)
Creatinine, Ser: 0.67 mg/dL (ref 0.44–1.00)
GFR, Estimated: >60 mL/min (ref >60)
Glucose, Bld: 106 mg/dL — ABNORMAL HIGH (ref 70–99)
Potassium: 4 mmol/L (ref 3.5–5.1)
Sodium: 137 mmol/L (ref 135–145)
Total Bilirubin: 0.9 mg/dL (ref 0.3–1.2)
Total Protein: 7.8 g/dL (ref 6.5–8.1)

## 2021-01-29 LAB — LIPASE, BLOOD: Lipase: 35 U/L (ref 11–51)

## 2021-01-29 LAB — TROPONIN I (HIGH SENSITIVITY): Troponin I (High Sensitivity): 4 ng/L (ref <18)

## 2021-01-29 LAB — POC URINE PREG, ED: Preg Test, Ur: NEGATIVE

## 2021-01-29 MED ORDER — ONDANSETRON HCL 4 MG/2ML IJ SOLN: 4.0000 mg | Freq: Once | INTRAMUSCULAR | Status: DC

## 2021-01-29 MED ORDER — LACTATED RINGERS IV BOLUS
1000.0000 mL | Freq: Once | INTRAVENOUS | Status: AC
  Administered 2021-01-29: 1000 mL via INTRAVENOUS

## 2021-01-29 MED ORDER — MORPHINE SULFATE (PF) 4 MG/ML IV SOLN
4.0000 mg | Freq: Once | INTRAVENOUS | Status: AC
Start: 2021-01-29 — End: 2021-01-29
  Administered 2021-01-29: 4 mg via INTRAVENOUS
  Filled 2021-01-29: qty 1

## 2021-01-29 MED ORDER — KETOROLAC TROMETHAMINE 30 MG/ML IJ SOLN
15.0000 mg | Freq: Once | INTRAMUSCULAR | Status: AC
  Administered 2021-01-29: 15 mg via INTRAVENOUS
  Filled 2021-01-29: qty 1

## 2021-01-29 NOTE — ED Triage Notes (Signed)
Pt comes with c/o abdominal pain, right swelling in leg and lower back pain. Pt denies any pregnancy. Pt denies any trouble urinating.  Pt states pain is intense and shooting.

## 2021-01-29 NOTE — ED Provider Notes (Signed)
[facility] Emergency Department Provider Note  ____________________________________________   Event Date/Time   First MD Initiated Contact with Patient 01/29/21 1121     (approximate)  I have reviewed the triage vital signs and the nursing notes.   HISTORY  Chief Complaint Abdominal Pain   HPI Lisa Rowland is a 35 y.o. female past medical history of anemia, anxiety, scoliosis and chronic back pain on Percocet who presents for assessment of some primarily right-sided abdominal pain associated bilateral flank pain and bilateral lower back pain rating down her right leg.  She also states she is worried her right leg possible more swollen today than usual.  She also endorses some intermittent chest pains today but this is been going on and off over the last couple weeks.  She states her right-sided abdominal pain began rather suddenly yesterday morning and has been on and off since then.  She describes it as sharp.  No new headache, earache, sore throat, cough, fevers, vomiting, diarrhea, burning with urination, abnormal vaginal bleeding or discharge or recent injuries or falls.  Patient states she been taking her Percocets but still in upper back pain.  She does think she is urinating the less than usual today.  No proximal episodes.  No other acute concerns at this time.         Past Medical History:  Diagnosis Date  . Anemia   . Anxiety   . ASCUS favor benign   . History of preterm delivery    with spontaneous labor in 2010 and 2015  . History of preterm premature rupture of membranes (PPROM)    2010 at 32 weeks  . HPV in female   . HSV infection   . Hypotension   . Scoliosis     Patient Active Problem List   Diagnosis Date Noted  . Unwanted fertility   . Status post cesarean delivery 11/08/2019  . Umbilical cord prolapse 11/06/2019  . Premature rupture of membranes 10/30/2019  . Vaginal bleeding during pregnancy 10/20/2019  . Cervical cerclage  suture present, antepartum 10/03/2019  . Short cervix 08/22/2019  . Current pregnancy with history of pre-term labor in first trimester 07/28/2019  . History of herpes genitalis 07/28/2019  . Herpes virus infection in mother during pregnancy, antepartum 07/28/2019  . Hyperemesis affecting pregnancy, antepartum 06/15/2019  . Hyperemesis 06/15/2019  . Supervision of high-risk pregnancy 05/24/2017  . Headache, migraine 01/23/2014  . 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 Clinical trial participant 11/25/2013  . Opioid dependence (HCC) 10/27/2013  . Myofascial pain 10/10/2013  . Continuous opioid dependence (HCC) 09/22/2013  . Bipolar affective disorder (HCC) 08/19/2013  . Scoliosis 08/19/2013  . Lesion of vulva 08/02/2013  . HPV test positive 06/08/2013  . Cervical pain 04/03/2012    Past Surgical History:  Procedure Laterality Date  . CERVICAL CERCLAGE  08/31/2020  . CESAREAN SECTION N/A 11/06/2019   Procedure: CESAREAN SECTION;  Surgeon: Vena AustriaStaebler, Andreas, MD;  Location: [facility] ORS;  Service: Obstetrics;  Laterality: N/A;  . INDUCED ABORTION    . LAPAROSCOPIC BILATERAL SALPINGECTOMY Bilateral 01/10/2020   Procedure: LAPAROSCOPIC BILATERAL SALPINGECTOMY;  Surgeon: Vena AustriaStaebler, Andreas, MD;  Location: [facility] ORS;  Service: Gynecology;  Laterality: Bilateral;    Prior to Admission medications   Medication Sig Start Date End Date Taking? Authorizing Provider  medroxyPROGESTERone (DEPO-PROVERA) 150 MG/ML injection Inject 1 mL (150 mg total) into the muscle every 3 (three) months. 01/16/21   Vena AustriaStaebler, Andreas, MD  norethindrone (AYGESTIN) 5 MG tablet Take  1 tablet (5 mg total) by mouth daily for 10 days. 07/03/20 07/13/20  Mirna Mires, CNM  albuterol (PROVENTIL HFA;VENTOLIN HFA) 108 [phone] Base) MCG/ACT inhaler Inhale 2 puffs into the lungs every 6 (six) hours as needed for wheezing or shortness of breath. 10/10/17 04/03/19  Fisher, Roselyn Bering, PA-C    Allergies Amoxicillin, Bee venom, Coconut  oil, Mushroom extract complex, and Other  Family History  Problem Relation Age of Onset  . Osteoarthritis Mother   . Asthma Mother   . Hypertension Mother   . Osteoarthritis Father   . Hypertension Father   . Osteoarthritis Maternal Grandmother   . Asthma Maternal Grandmother   . Cancer Maternal Grandmother        breast cancer  . Diabetes Maternal Grandmother   . Heart failure Maternal Grandmother   . Hyperlipidemia Maternal Grandmother   . Hypertension Maternal Grandmother   . Migraines Maternal Grandmother   . Thyroid disease Maternal Grandmother   . Osteoarthritis Paternal Grandmother   . Asthma Paternal Grandmother   . Cancer Paternal Grandmother        stomach  . Diabetes Paternal Grandmother   . Heart failure Paternal Grandmother   . Hyperlipidemia Paternal Grandmother   . Hypertension Paternal Grandmother   . Breast cancer Paternal Aunt   . Hypertension Sister     Social History Social History   Tobacco Use  . Smoking status: Never Smoker  . Smokeless tobacco: Never Used  Vaping Use  . Vaping Use: Never used  Substance Use Topics  . Alcohol use: No  . Drug use: No    Review of Systems  Review of Systems  Constitutional: Positive for malaise/fatigue. Negative for chills and fever.  HENT: Negative for sore throat.   Eyes: Negative for pain.  Respiratory: Negative for cough and stridor.   Cardiovascular: Positive for chest pain.  Gastrointestinal: Positive for abdominal pain. Negative for vomiting.  Genitourinary: Positive for flank pain and urgency.  Musculoskeletal: Positive for back pain.  Skin: Negative for rash.  Neurological: Negative for seizures, loss of consciousness and headaches.  Psychiatric/Behavioral: Negative for suicidal ideas.  All other systems reviewed and are negative.     ____________________________________________   PHYSICAL EXAM:  VITAL SIGNS: ED Triage Vitals  Enc Vitals Group     BP 01/29/21 1110 102/82     Pulse  Rate 01/29/21 1110 84     Resp 01/29/21 1110 18     Temp 01/29/21 1110 98.3 F (36.8 C)     Temp Source 01/29/21 1110 Oral     SpO2 01/29/21 1110 98 %     Weight --      Height --      Head Circumference --      Peak Flow --      Pain Score 01/29/21 1110 10     Pain Loc --      Pain Edu? --      Excl. in GC? --    Vitals:   01/29/21 1400 01/29/21 1500  BP: 115/64 105/70  Pulse: 60 63  Resp:  20  Temp:    SpO2: 100% 100%   Physical Exam Vitals and nursing note reviewed.  Constitutional:      General: She is not in acute distress.    Appearance: She is well-developed.  HENT:     Head: Normocephalic and atraumatic.     Right Ear: External ear normal.     Left Ear: External ear normal.  Nose: Nose normal.  Eyes:     Conjunctiva/sclera: Conjunctivae normal.  Cardiovascular:     Rate and Rhythm: Normal rate and regular rhythm.     Heart sounds: No murmur heard.   Pulmonary:     Effort: Pulmonary effort is normal. No respiratory distress.     Breath sounds: Normal breath sounds.  Abdominal:     Palpations: Abdomen is soft.     Tenderness: There is no abdominal tenderness. There is right CVA tenderness and left CVA tenderness.  Musculoskeletal:     Cervical back: Neck supple.     Right lower leg: Edema present.     Left lower leg: Edema present.  Skin:    General: Skin is warm and dry.     Capillary Refill: Capillary refill takes less than 2 seconds.  Neurological:     Mental Status: She is alert and oriented to person, place, and time.  Psychiatric:        Mood and Affect: Mood normal.     2+ bilateral radial and DP pulses.  Sensation intact to light touch throughout all extremities.  Patient has symmetric strength in her bilateral lower extremities.  To some mild bilateral CVA and SI tenderness.  No overlying skin changes.  No midline tenderness over the T or L-spine.  Abdomen is soft throughout.  ____________________________________________   LABS (all  labs ordered are listed, but only abnormal results are displayed)  Labs Reviewed  COMPREHENSIVE METABOLIC PANEL - Abnormal; Notable for the following components:      Result Value   Glucose, Bld 106 (*)    Calcium 8.7 (*)    AST 12 (*)    All other components within normal limits  URINALYSIS, COMPLETE (UACMP) WITH MICROSCOPIC - Abnormal; Notable for the following components:   Color, Urine YELLOW (*)    APPearance CLOUDY (*)    Leukocytes,Ua TRACE (*)    Bacteria, UA FEW (*)    All other components within normal limits  LIPASE, BLOOD  CBC  POC URINE PREG, ED  TROPONIN I (HIGH SENSITIVITY)  TROPONIN I (HIGH SENSITIVITY)   ____________________________________________  EKG  Sinus bradycardia with ventricular to 58 without any evidence of acute ischemia or significant underlying arrhythmia. ____________________________________________  RADIOLOGY  ED MD interpretation: Ultrasound of the bilateral extremities shows no evidence of DVT.  CT abdomen pelvis shows a right ovarian cyst versus large follicle without any evidence of diverticulitis, kidney stone, pyelonephritis, cholecystitis, appendicitis or any other clear acute abdominopelvic pathology.  Chest x-ray shows no evidence of focal consolidation, thorax, edema, effusion or any other clear acute intrathoracic process.  Pelvic ultrasound shows small appearing follicle in the right ovary without any evidence of torsion or other abnormalities noted.  Official radiology report(s): CT ABDOMEN PELVIS WO CONTRAST  Result Date: 01/29/2021 CLINICAL DATA:  Acute abdominal pain EXAM: CT ABDOMEN AND PELVIS WITHOUT CONTRAST TECHNIQUE: Multidetector CT imaging of the abdomen and pelvis was performed following the standard protocol without IV contrast. COMPARISON:  None. FINDINGS: Lower chest: Included lung bases are clear.  Heart size is normal. Hepatobiliary: Unremarkable unenhanced appearance of the liver. No focal liver lesion identified.  Gallbladder within normal limits. No hyperdense gallstone. No biliary dilatation. Pancreas: Unremarkable. No pancreatic ductal dilatation or surrounding inflammatory changes. Spleen: Normal in size without focal abnormality. Adrenals/Urinary Tract: Adrenal glands are unremarkable. Kidneys are normal, without renal calculi, focal lesion, or hydronephrosis. Bladder is unremarkable for the degree of distension. Stomach/Bowel: Stomach is within normal limits. No dilated  loops of small bowel. Somewhat redundant appearance of the colon. The cecum is located centrally within the low pelvis. A noninflamed appendix is seen within the left lower quadrant adjacent to the anterior uterine body/fundus (series 6, images 66-68). No bowel wall thickening or inflammatory changes are seen. Vascular/Lymphatic: No vascular abnormality is evident on non contrasted study. No abdominopelvic lymphadenopathy. Reproductive: Retroverted uterus. 2.2 cm right adnexal cyst or follicle. No left adnexal abnormality. Other: No free fluid. No abdominopelvic fluid collection. No pneumoperitoneum. No abdominal wall hernia. Musculoskeletal: No acute or significant osseous findings. IMPRESSION: 1. No acute abdominopelvic findings. 2. Incidentally noted 2.2 cm right adnexal cyst or follicle. No follow-up imaging recommended. Note: This recommendation does not apply to premenarchal patients and to those with increased risk (genetic, family history, elevated tumor markers or other high-risk factors) of ovarian cancer. Reference: JACR 2020 Feb; 17(2):248-254 Electronically Signed   By: Duanne Guess D.O.   On: 01/29/2021 13:31   DG Chest 2 View  Result Date: 01/29/2021 CLINICAL DATA:  Chest pain EXAM: CHEST - 2 VIEW COMPARISON:  09/13/2020 FINDINGS: Heart size and vascularity normal. Lungs well aerated and clear. No infiltrate effusion or mass Moderate thoracolumbar kyphosis unchanged from the prior study. IMPRESSION: No active cardiopulmonary  disease. Electronically Signed   By: Marlan Palau M.D.   On: 01/29/2021 13:47   US Venous Img Lower Bilateral  Result Date: 01/29/2021 CLINICAL DATA:  Edema EXAM: BILATERAL LOWER EXTREMITY VENOUS DOPPLER ULTRASOUND TECHNIQUE: Gray-scale sonography with compression, as well as color and duplex ultrasound, were performed to evaluate the deep venous system(s) from the level of the common femoral vein through the popliteal and proximal calf veins. COMPARISON:  None. FINDINGS: VENOUS Normal compressibility of the common femoral, superficial femoral, and popliteal veins, as well as the visualized calf veins. Visualized portions of profunda femoral vein and great saphenous vein unremarkable. No filling defects to suggest DVT on grayscale or color Doppler imaging. Doppler waveforms show normal direction of venous flow, normal respiratory plasticity and response to augmentation. Limited views of the contralateral common femoral vein are unremarkable. OTHER None. Limitations: none IMPRESSION: Negative. Electronically Signed   By: Lauralyn Primes M.D.   On: 01/29/2021 12:42   US PELVIC COMPLETE W TRANSVAGINAL AND TORSION R/O  Result Date: 01/29/2021 CLINICAL DATA:  Follow-up cystic structure on the right. EXAM: TRANSABDOMINAL AND TRANSVAGINAL ULTRASOUND OF PELVIS DOPPLER ULTRASOUND OF OVARIES TECHNIQUE: Both transabdominal and transvaginal ultrasound examinations of the pelvis were performed. Transabdominal technique was performed for global imaging of the pelvis including uterus, ovaries, adnexal regions, and pelvic cul-de-sac. It was necessary to proceed with endovaginal exam following the transabdominal exam to visualize the ovaries. Color and duplex Doppler ultrasound was utilized to evaluate blood flow to the ovaries. COMPARISON:  CT from earlier in the same day. FINDINGS: Uterus Measurements: 8.0 x 4.2 x 5.0 cm. = volume: 89 mL. No fibroids or other mass visualized. Endometrium Thickness: 7 mm.  No focal  abnormality visualized. Right ovary Measurements: 3.3 x 2.1 x 3.5 cm. = volume: 13 mL. Prominent follicle measuring less than 3 cm is noted. Left ovary Measurements: 3.3 x 1.8 x 1.5 cm. = volume: 4.5 mL. Normal appearance/no adnexal mass. Pulsed Doppler evaluation of both ovaries demonstrates normal low-resistance arterial and venous waveforms. Other findings Small amount of free pelvic fluid likely physiologic in nature. IMPRESSION: Small benign-appearing follicle in the right ovary. No other focal abnormality is noted. Electronically Signed   By: Alcide Clever M.D.   On:  01/29/2021 16:16    ____________________________________________   PROCEDURES  Procedure(s) performed (including Critical Care):  .1-3 Lead EKG Interpretation Performed by: Gilles Chiquito, MD Authorized by: Gilles Chiquito, MD     Interpretation: normal     ECG rate assessment: normal     Rhythm: sinus rhythm     Ectopy: none     Conduction: normal       ____________________________________________   INITIAL IMPRESSION / ASSESSMENT AND PLAN / ED COURSE      Patient presents with above-stated history exam for assessment of several symptoms including intermittent chest pains over the last couple of weeks but a little worse today as well as concerned she has some swelling in her right lower extremity and pain in both flanks and both sides of her lower back rating down her right leg associate with some right-sided abdominal pain.  On arrival she was afebrile and hemodynamically stable.  She does have some bilateral mild CVA tenderness and SI joint tenderness.  She is otherwise neurovascular intact throughout both lower extremities.  There is some subtle bilateral 1+ pitting edema which appears fairly symmetric with no obvious findings on exam to suggest cellulitis, septic joint or other clear acute pathology to the right lower extremity.  With regard to swelling in the lower legs perceived by patient we will more on  the right and left differential includes possible DVT versus nephrosis versus decreased albumin production versus other nonimmediately life-threatening causes at this time.  Ultrasound shows no evidence of DVT. No history or findings to suggest recent trauma.  No findings on exam to suggest septic joint or cellulitis or other clear acute infectious process.  With regard to her back pain abdominal pain differential includes kidney stone, pyelonephritis, cystitis, brain cyst, cholecystitis, pancreatitis diverticulitis and possible MSK.  CMP remarkable for normal albumin.  UA without significant protein.  Chest x-ray and remainder of her history and exam not consistent with acute heart failure.  Overall unclear etiology for some edema although I think patient is safe for continued outpatient evaluation with regard to the symptoms.  With regard to her intermittent chest pains differential includes possible ACS, arrhythmia, PE, anemia, pericarditis, colitis and pneumonia.  Chest x-ray is unremarkable for any evidence of pneumonia heart failure effusion or any other clear acute thoracic process.  ECG and nonelevated troponin obtained.  3 hours after symptom onset are not consistent with ACS.  Patient is PERC negative and overall very low sufficient for PE.  CBC has no leukocytosis or acute anemia.  Patient has no fever and overall I have a low suspicion for acute infectious process or other immediately life-threatening pathology with regard to her chest pain.  Unclear etiology for this at this time.  With regard to her right lower quadrant abdominal pain CT shows right ovarian cyst versus follicle without any other clear acute abdominopelvic pathology.  No evidence of diverticulitis, myositis, pancreatitis or cholecystitis.  BMP has no evidence of hepatitis or cholestasis.  Lipase not consistent with pancreatitis.  Urine does not appear infected.  Urine pregnancy test is negative.  Given concern for possible torsion  ultrasound obtained.  Ultrasound shows no evidence of torsion but does show right follicular cyst.  Suspect this may be symptomatic causing patient's symptoms.  On reassessment she states that she never has any pain in this area given stable vitals with reassuring exam work-up I think she is safe for discharge with continued outpatient evaluation.  Discharged stable condition.  Strict return  precautions advised and discussed.      ____________________________________________   FINAL CLINICAL IMPRESSION(S) / ED DIAGNOSES  Final diagnoses:  Pelvic pain  Acute bilateral low back pain, unspecified whether sciatica present  Ovarian follicular cyst  Chest pain, unspecified type  Edema, unspecified type    Medications  ondansetron (ZOFRAN) injection 4 mg (4 mg Intravenous Patient Refused/Not Given 01/29/21 1330)  morphine 4 MG/ML injection 4 mg (4 mg Intravenous Given 01/29/21 1155)  lactated ringers bolus 1,000 mL (1,000 mLs Intravenous New Bag/Given 01/29/21 1221)  ketorolac (TORADOL) 30 MG/ML injection 15 mg (15 mg Intravenous Given 01/29/21 1352)     ED Discharge Orders    None       Note:  This document was prepared using Dragon voice recognition software and may include unintentional dictation errors.   Gilles Chiquito, MD 01/29/21 [phone]

## 2021-02-01 ENCOUNTER — Other Ambulatory Visit (HOSPITAL_COMMUNITY)
Admission: RE | Admit: 2021-02-01 | Discharge: 2021-02-01 | Disposition: A | Discharge status: Home / Self Care | Payer: Medicaid Other | Source: Ambulatory Visit | Attending: Obstetrics | Admitting: Obstetrics

## 2021-02-01 ENCOUNTER — Other Ambulatory Visit: Payer: Self-pay

## 2021-02-01 ENCOUNTER — Ambulatory Visit (INDEPENDENT_AMBULATORY_CARE_PROVIDER_SITE_OTHER): Payer: Medicaid Other | Admitting: Obstetrics

## 2021-02-01 ENCOUNTER — Encounter: Payer: Self-pay | Admitting: Obstetrics

## 2021-02-01 VITALS — BP 102/64 | HR 73 | Ht 67.0 in | Wt 186.0 lb

## 2021-02-01 DIAGNOSIS — Z01419 Encounter for gynecological examination (general) (routine) without abnormal findings: Secondary | ICD-10-CM

## 2021-02-01 DIAGNOSIS — N921 Excessive and frequent menstruation with irregular cycle: Secondary | ICD-10-CM

## 2021-02-01 DIAGNOSIS — Z124 Encounter for screening for malignant neoplasm of cervix: Secondary | ICD-10-CM

## 2021-02-01 DIAGNOSIS — Z113 Encounter for screening for infections with a predominantly sexual mode of transmission: Secondary | ICD-10-CM | POA: Diagnosis not present

## 2021-02-01 MED ORDER — MEDROXYPROGESTERONE ACETATE 150 MG/ML IM SUSP: 150.0000 mg | INTRAMUSCULAR | 0 refills | Status: AC

## 2021-02-01 NOTE — Progress Notes (Signed)
ED visit Tuesday lower abd pain, advised to have ovarian cyst checked.

## 2021-02-01 NOTE — Progress Notes (Signed)
Gynecology Annual Exam   PCP: [facility] [place]  Chief Complaint:  Chief Complaint  Patient presents with  . Annual Exam    ED visit Tuesday lower abd pain    History of Present Illness: Patient is a 35 y.o. [id number] presents for annual exam. The patient has no complaints today.   LMP: No LMP recorded. Patient has had an injection. Average Interval: irregular, not applicable days She has had a BTL and takes Depo for heavy periods. Duration of flow: 5 days Heavy Menses: no Clots: no Intermenstrual Bleeding: no Postcoital Bleeding: no Dysmenorrhea: no  The patient is sexually active. She currently uses tubal ligation for contraception. She denies dyspareunia.  The patient does perform self breast exams.  There is no notable family history of breast or ovarian cancer in her family.  The patient wears seatbelts: yes.   The patient has regular exercise: yes.    The patient denies current symptoms of depression.    Review of Systems: Review of Systems  Constitutional: Negative.   HENT: Negative.   Eyes: Negative.   Respiratory: Negative.   Cardiovascular: Negative.   Gastrointestinal: Negative.   Genitourinary: Positive for urgency.       Describes some stress incontinance  Musculoskeletal: Positive for back pain.       And chronic back pain- Is under treatment for this- "disc problems"  Skin: Negative.   Neurological: Negative.   Endo/Heme/Allergies: Negative.   Psychiatric/Behavioral: Negative.        Hx of some mood "irritability". Hx of suicide attempt. Bipolar affective disorder.    Past Medical History:  Patient Active Problem List   Diagnosis Date Noted  . Unwanted fertility   . Status post cesarean delivery 11/08/2019  . 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 Short cervix 08/22/2019    Cervix 2.3cm, closed, soft. Options for cerclage v. Progesterone reviewed. Will begin vaginal progesterone and follow up with anatomy scan, if shorter  or dilating, she will consider cerclage placement.  - cerclage 09/01/2019   . Current pregnancy with history of pre-term labor in first trimester 07/28/2019    '[ ]'  Consider 17-P '[ ]'  Serial cervical length Ultrasounds between 16-23 weeks   . History of herpes genitalis 07/28/2019  . Herpes virus infection in mother during pregnancy, antepartum 07/28/2019    '[ ]'  Initiate valtrex at 36 weeks   . Hyperemesis 06/15/2019  . Supervision of high-risk pregnancy 05/24/2017      Clinic [facility] Prenatal Labs  Dating  6 wk [place] Blood type: A/Positive/-- (09/30 1116)   Genetic Screen  AFP:     NIPT:normal XX Antibody:Negative (09/30 1116)  Anatomic [place]  Rubella: 12.10 (09/30 1116)  GTT Early:               Third trimester:  RPR: Non Reactive (09/30 1116)   Flu vaccine  06/16/2019 HBsAg: Negative (09/30 1116)   TDaP vaccine                                               Rhogam:not needed HIV: Non Reactive (09/30 1116)   Baby Food  GBS: (For PCN allergy, check sensitivities)  Contraception  Pap: 2020 NIL   Circumcision    Pediatrician  CF:  Support Person  SMA  Prenatal Classes  Hgb electrophoresis:      . Headache, migraine 01/23/2014  . Clinical trial participant 11/25/2013    Overview:  Ms. Lueras is enrolled in the Cervical Microbiome study.  If delivery, please collect 4 placenta samples: membrane rupture site, membranes distant from rupture site, placental bed and umbilical cord.  Containers located in triage anteroom.  Once collected, samples can be stored in the research refrigerator.  Call [facility] at [phone] or page [phone] with questions.    . Opioid dependence ([place]) 10/27/2013    Overview:  Pain contract signed 2/12 with Lanora Manis, MD. Please refer to note for all Pain medication concerns.   . Myofascial pain 10/10/2013  . Continuous opioid dependence ([place]) 09/22/2013  . Bipolar affective disorder ([place]) 08/19/2013    Overview:  History of  a suicide attempt    . Scoliosis 08/19/2013  . Lesion of vulva 08/02/2013    Overview:  HSV II DNA positive.  Valtrex 510m bid for 10 days called to local pharmacy Will start on suppression @ 36 weeks   . HPV test positive 06/08/2013  . Cervical pain 04/03/2012    Past Surgical History:  Past Surgical History:  Procedure Laterality Date  . CERVICAL CERCLAGE  08/31/2020  . CESAREAN SECTION N/A 11/06/2019   Procedure: CESAREAN SECTION;  Surgeon: SMalachy Mood MD;  Location: [facility] ORS;  Service: Obstetrics;  Laterality: N/A;  . INDUCED ABORTION    . LAPAROSCOPIC BILATERAL SALPINGECTOMY Bilateral 01/10/2020   Procedure: LAPAROSCOPIC BILATERAL SALPINGECTOMY;  Surgeon: SMalachy Mood MD;  Location: [facility] ORS;  Service: Gynecology;  Laterality: Bilateral;    Gynecologic History:  No LMP recorded. Patient has had an injection. Contraception: condoms Last Pap: Results were: no abnormalities   Obstetric History: [id number] Family History:  Family History  Problem Relation Age of Onset  . Osteoarthritis Mother   . Asthma Mother   . Hypertension Mother   . Osteoarthritis Father   . Hypertension Father   . Osteoarthritis Maternal Grandmother   . Asthma Maternal Grandmother   . Cancer Maternal Grandmother        breast cancer  . Diabetes Maternal Grandmother   . Heart failure Maternal Grandmother   . Hyperlipidemia Maternal Grandmother   . Hypertension Maternal Grandmother   . Migraines Maternal Grandmother   . Thyroid disease Maternal Grandmother   . Osteoarthritis Paternal Grandmother   . Asthma Paternal Grandmother   . Cancer Paternal Grandmother        stomach  . Diabetes Paternal Grandmother   . Heart failure Paternal Grandmother   . Hyperlipidemia Paternal Grandmother   . Hypertension Paternal Grandmother   . Breast cancer Paternal Aunt   . Hypertension Sister     Social History:  Social History   Socioeconomic History  . Marital status: Single    Spouse  name: Not on file  . Number of children: Not on file  . Years of education: Not on file  . Highest education level: Not on file  Occupational History  . Not on file  Tobacco Use  . Smoking status: Never Smoker  . Smokeless tobacco: Never Used  Vaping Use  . Vaping Use: Never used  Substance and Sexual Activity  . Alcohol use: No  . Drug use: No  . Sexual activity: Yes    Birth control/protection: Surgical  Comment: Hysterectomy 01/10/20  Other Topics Concern  . Not on file  Social History Narrative  . Not on file   Social Determinants of Health   Financial Resource Strain: Not on file  Food Insecurity: Not on file  Transportation Needs: Not on file  Physical Activity: Not on file  Stress: Not on file  Social Connections: Not on file  Intimate Partner Violence: Not on file    Allergies:  Allergies  Allergen Reactions  . Amoxicillin Hives  . Bee Venom Swelling and Shortness Of Breath  . Coconut Oil Shortness Of Breath and Swelling  . Mushroom Extract Complex Swelling  . Other Anaphylaxis and Swelling    Coconut    Medications: Prior to Admission medications   Medication Sig Start Date End Date Taking? Authorizing Provider  cyclobenzaprine (FLEXERIL) 10 MG tablet Take 1 tablet by mouth 3 (three) times daily. 01/30/21  Yes [provider]  medroxyPROGESTERone (DEPO-PROVERA) 150 MG/ML injection Inject 1 mL (150 mg total) into the muscle every 3 (three) months. 01/16/21  Yes Malachy Mood, MD  NARCAN 4 MG/0.1ML LIQD nasal spray kit 1 spray as directed. 12/26/20  Yes [provider]  oxyCODONE-acetaminophen (PERCOCET/ROXICET) 5-325 MG tablet Take 1 tablet by mouth every 6 (six) hours. 12/31/20  Yes [provider]  norethindrone (AYGESTIN) 5 MG tablet Take 1 tablet (5 mg total) by mouth daily for 10 days. 07/03/20 07/13/20  Imagene Riches, CNM  albuterol (PROVENTIL HFA;VENTOLIN HFA) 108 [phone] Base) MCG/ACT inhaler Inhale 2 puffs into the lungs  every 6 (six) hours as needed for wheezing or shortness of breath. 10/10/17 04/03/19  Fisher, Linden Dolin, PA-C    Physical Exam Vitals: Blood pressure 102/64, pulse 73, height '5\' 7"'  (1.702 m), weight 186 lb (84.4 kg), not currently breastfeeding.  General: NAD HEENT: normocephalic, anicteric Thyroid: no enlargement, no palpable nodules Pulmonary: No increased work of breathing, CTAB Cardiovascular: RRR, distal pulses 2+ Breast: Breast symmetrical, no tenderness, no palpable nodules or masses, no skin or nipple retraction present, no nipple discharge.  No axillary or supraclavicular lymphadenopathy. Abdomen: NABS, soft, non-tender, non-distended.  Umbilicus without lesions.  No hepatomegaly, splenomegaly or masses palpable. No evidence of hernia  Genitourinary:  External: Normal external female genitalia.  Normal urethral meatus, normal Bartholin's and Skene's glands.    Vagina: Normal vaginal mucosa, no evidence of prolapse.    Cervix: Grossly normal in appearance, no bleeding  Uterus: Non-enlarged, slightly "descended" per bimanual exam. mobile, normal contour.  No CMT cystocele noted, no adnexal tenderness today  Adnexa: ovaries non-enlarged, no adnexal masses  Rectal: deferred  Lymphatic: no evidence of inguinal lymphadenopathy Extremities: no edema, erythema, or tenderness Neurologic: Grossly intact Psychiatric: mood appropriate, affect full  Female chaperone present for pelvic and breast  portions of the physical exam    Assessment: 35 y.o. [id number] routine annual exam Routine STI screening Pelvic organ prolapse with stress incontinence, desiring treatment.  Plan: Problem List Items Addressed This Visit   None   Visit Diagnoses    Women's annual routine gynecological examination    -  Primary   Relevant Orders   Cytology - PAP   Cervical cancer screening       Relevant Orders   Cytology - PAP   Screen for STD (sexually transmitted disease)       Routine screening for STI  (sexually transmitted infection)       Relevant Orders   HEP, RPR, HIV Panel      2) STI screening  wasoffered and accepted  2)  ASCCP guidelines and rational discussed.  Patient opts for yearly screening interval  3) Contraception - the patient is currently using  tubal ligation.  She is happy with her current form of contraception and plans to continue  4) Routine healthcare maintenance including cholesterol, diabetes screening discussed managed by PCP  5) Return in about 4 weShe has discussed this with [address] before, but now desires a plan of care and options discussion.  Imagene Riches, CNM  02/01/2021 10:49 AM   [facility] 02/01/2021, 10:10 AM

## 2021-02-02 LAB — HEP, RPR, HIV PANEL
HIV Screen 4th Generation wRfx: NONREACTIVE
Hepatitis B Surface Ag: NEGATIVE
RPR Ser Ql: NONREACTIVE

## 2021-02-04 LAB — CYTOLOGY - PAP
Chlamydia: NEGATIVE
Comment: NEGATIVE
Comment: NEGATIVE
Comment: NEGATIVE
Comment: NORMAL
Diagnosis: NEGATIVE
High risk HPV: NEGATIVE
Neisseria Gonorrhea: NEGATIVE
Trichomonas: NEGATIVE

## 2021-02-13 ENCOUNTER — Other Ambulatory Visit: Payer: Self-pay

## 2021-02-13 ENCOUNTER — Encounter: Payer: Self-pay | Admitting: Emergency Medicine

## 2021-02-13 ENCOUNTER — Emergency Department
Admission: EM | Admit: 2021-02-13 | Discharge: 2021-02-13 | Disposition: A | Discharge status: Home / Self Care | Payer: Medicaid Other | Attending: Emergency Medicine | Admitting: Emergency Medicine

## 2021-02-13 ENCOUNTER — Emergency Department: Payer: Medicaid Other

## 2021-02-13 DIAGNOSIS — S63601A Unspecified sprain of right thumb, initial encounter: Secondary | ICD-10-CM | POA: Diagnosis not present

## 2021-02-13 DIAGNOSIS — W19XXXA Unspecified fall, initial encounter: Secondary | ICD-10-CM | POA: Diagnosis not present

## 2021-02-13 DIAGNOSIS — S6991XA Unspecified injury of right wrist, hand and finger(s), initial encounter: Secondary | ICD-10-CM | POA: Diagnosis present

## 2021-02-13 MED ORDER — NAPROXEN 500 MG PO TABS: 500.0000 mg | ORAL_TABLET | Freq: Two times a day (BID) | ORAL | 0 refills | Status: DC

## 2021-02-13 NOTE — ED Triage Notes (Signed)
Pt comes into the ED via POV c/o a fall where she caught her fall with her thumb and now she presents with swelling to the right thumb.  Pt in NAD.

## 2021-02-13 NOTE — Discharge Instructions (Signed)
Read and follow discharge care instructions.  Wear splint for 3 to 5 days as needed.  Take medication as directed.

## 2021-02-13 NOTE — ED Provider Notes (Signed)
[facility] Emergency Department Provider Note   ____________________________________________   Event Date/Time   First MD Initiated Contact with Patient 02/13/21 1350     (approximate)  I have reviewed the triage vital signs and the nursing notes.   HISTORY  Chief Complaint Finger Injury    HPI Lisa Rowland is a 35 y.o. female patient complaining of right thumb pain secondary to breaking a fall 2 days ago.  Patient states thumb is still swollen she believes it might be broken.  Patient denies loss sensation or loss of function.  Patient is left-hand dominant.  Patient rates the pain as a 10/10.  Patient described pain as "sore".  Patient states she has adequate pain medication.         Past Medical History:  Diagnosis Date  . Anemia   . Anxiety   . ASCUS favor benign   . History of preterm delivery    with spontaneous labor in 2010 and 2015  . History of preterm premature rupture of membranes (PPROM)    2010 at 32 weeks  . HPV in female   . HSV infection   . Hypotension   . Premature rupture of membranes 10/30/2019  . Scoliosis   . Trichomonal fluor vaginalis 01/23/2014   Overview:  Will NEED REPEAT HIV TESTING!!!     Patient Active Problem List   Diagnosis Date Noted  . Unwanted fertility   . Status post cesarean delivery 11/08/2019  . Umbilical cord prolapse 67/08/4579  . Short cervix 08/22/2019  . Current pregnancy with history of pre-term labor in first trimester 07/28/2019  . History of herpes genitalis 07/28/2019  . Herpes virus infection in mother during pregnancy, antepartum 07/28/2019  . Hyperemesis 06/15/2019  . Supervision of high-risk pregnancy 05/24/2017  . Headache, migraine 01/23/2014  . Clinical trial participant 11/25/2013  . Opioid dependence ([place]) 10/27/2013  . Myofascial pain 10/10/2013  . Continuous opioid dependence ([place]) 09/22/2013  . Bipolar affective disorder ([place]) 08/19/2013  . Scoliosis 08/19/2013  .  Lesion of vulva 08/02/2013  . HPV test positive 06/08/2013  . Cervical pain 04/03/2012    Past Surgical History:  Procedure Laterality Date  . CERVICAL CERCLAGE  08/31/2020  . CESAREAN SECTION N/A 11/06/2019   Procedure: CESAREAN SECTION;  Surgeon: Malachy Mood, MD;  Location: [facility] ORS;  Service: Obstetrics;  Laterality: N/A;  . INDUCED ABORTION    . LAPAROSCOPIC BILATERAL SALPINGECTOMY Bilateral 01/10/2020   Procedure: LAPAROSCOPIC BILATERAL SALPINGECTOMY;  Surgeon: Malachy Mood, MD;  Location: [facility] ORS;  Service: Gynecology;  Laterality: Bilateral;    Prior to Admission medications   Medication Sig Start Date End Date Taking? Authorizing Provider  naproxen (NAPROSYN) 500 MG tablet Take 1 tablet (500 mg total) by mouth 2 (two) times daily with a meal. 02/13/21  Yes Sable Feil, PA-C  cyclobenzaprine (FLEXERIL) 10 MG tablet Take 1 tablet by mouth 3 (three) times daily. 01/30/21   [provider]  medroxyPROGESTERone (DEPO-PROVERA) 150 MG/ML injection Inject 1 mL (150 mg total) into the muscle every 3 (three) months. 02/01/21   Imagene Riches, CNM  NARCAN 4 MG/0.1ML LIQD nasal spray kit 1 spray as directed. 12/26/20   [provider]  oxyCODONE-acetaminophen (PERCOCET/ROXICET) 5-325 MG tablet Take 1 tablet by mouth every 6 (six) hours. 12/31/20   [provider]  albuterol (PROVENTIL HFA;VENTOLIN HFA) 108 (90 Base) MCG/ACT inhaler Inhale 2 puffs into the lungs every 6 (six) hours as needed for wheezing or shortness of breath.  10/10/17 04/03/19  Fisher, Susan W, PA-C    Allergies Amoxicillin, Bee venom, Coconut oil, Mushroom extract complex, and Other  Family History  Problem Relation Age of Onset  . Osteoarthritis Mother   . Asthma Mother   . Hypertension Mother   . Osteoarthritis Father   . Hypertension Father   . Osteoarthritis Maternal Grandmother   . Asthma Maternal Grandmother   . Cancer Maternal Grandmother        breast cancer  . Diabetes  Maternal Grandmother   . Heart failure Maternal Grandmother   . Hyperlipidemia Maternal Grandmother   . Hypertension Maternal Grandmother   . Migraines Maternal Grandmother   . Thyroid disease Maternal Grandmother   . Osteoarthritis Paternal Grandmother   . Asthma Paternal Grandmother   . Cancer Paternal Grandmother        stomach  . Diabetes Paternal Grandmother   . Heart failure Paternal Grandmother   . Hyperlipidemia Paternal Grandmother   . Hypertension Paternal Grandmother   . Breast cancer Paternal Aunt   . Hypertension Sister     Social History Social History   Tobacco Use  . Smoking status: Never Smoker  . Smokeless tobacco: Never Used  Vaping Use  . Vaping Use: Never used  Substance Use Topics  . Alcohol use: No  . Drug use: No    Review of Systems  Constitutional: No fever/chills Eyes: No visual changes. ENT: No sore throat. Cardiovascular: Denies chest pain. Respiratory: Denies shortness of breath. Gastrointestinal: No abdominal pain.  No nausea, no vomiting.  No diarrhea.  No constipation. Genitourinary: Negative for dysuria. Musculoskeletal: Right thumb pain. Skin: Negative for rash. Neurological: Negative for headaches, focal weakness or numbness. Psychiatric:  Bipolar Allergic/Immunilogical: Amoxicillin, Bee Venom, coconut oil, and mushroom extracts.  ____________________________________________   PHYSICAL EXAM:  VITAL SIGNS: ED Triage Vitals  Enc Vitals Group     BP 02/13/21 1210 103/60     Pulse Rate 02/13/21 1210 80     Resp 02/13/21 1210 18     Temp 02/13/21 1210 98.6 F (37 C)     Temp Source 02/13/21 1210 Oral     SpO2 02/13/21 1210 99 %     Weight 02/13/21 1210 192 lb (87.1 kg)     Height 02/13/21 1210 5' 7" (1.702 m)     Head Circumference --      Peak Flow --      Pain Score 02/13/21 1209 10     Pain Loc --      Pain Edu? --      Excl. in GC? --     Constitutional: Alert and oriented. Well appearing and in no acute  distress. Cardiovascular: Normal rate, regular rhythm. Grossly normal heart sounds.  Good peripheral circulation. Respiratory: Normal respiratory effort.  No retractions. Lungs CTAB. Genitourinary: Deferred Musculoskeletal: No obvious deformity to the right thumb.  Patient has full Nikkel range of motion.  Neurologic:  Normal speech and language. No gross focal neurologic deficits are appreciated. No gait instability. Skin:  Skin is warm, dry and intact. No rash noted. Psychiatric: Mood and affect are normal. Speech and behavior are normal.  ____________________________________________   LABS (all labs ordered are listed, but only abnormal results are displayed)  Labs Reviewed - No data to display ____________________________________________  EKG   ____________________________________________  RADIOLOGY I, Ronald K Smith, personally viewed and evaluated these images (plain radiographs) as part of my medical decision making, as well as reviewing the written report by the radiologist.    ED MD interpretation: No acute findings x-ray of the right thumb.  Official radiology report(s): DG Finger Thumb Right  Result Date: 02/13/2021 CLINICAL DATA:  The patient suffered a right thumb injury in a fall. Initial encounter. EXAM: RIGHT THUMB 2+V COMPARISON:  None. FINDINGS: There is no evidence of fracture or dislocation. There is no evidence of arthropathy or other focal bone abnormality. Soft tissues are unremarkable. IMPRESSION: Negative exam. Electronically Signed   By: Thomas  Dalessio M.D.   On: 02/13/2021 12:55    ____________________________________________   PROCEDURES  Procedure(s) performed (including Critical Care):  Procedures   ____________________________________________   INITIAL IMPRESSION / ASSESSMENT AND PLAN / ED COURSE  As part of my medical decision making, I reviewed the following data within the electronic medical record:          Patient presents with  right thumb pain and edema secondary to breaking of near fall 3 days ago.  Discussed no acute findings on x-ray.  Patient complaining physical exam consistent with sprain thumb.  Patient placed in a thumb spica splint given discharge care instructions and a prescription for anti-inflammatory medications.  Patient advised follow-up PCP.      ____________________________________________   FINAL CLINICAL IMPRESSION(S) / ED DIAGNOSES  Final diagnoses:  Sprain of right thumb, unspecified site of digit, initial encounter     ED Discharge Orders         Ordered    naproxen (NAPROSYN) 500 MG tablet  2 times daily with meals        02/13/21 1425           Note:  This document was prepared using Dragon voice recognition software and may include unintentional dictation errors.    Smith, Ronald K, PA-C 02/13/21 1436    Paduchowski, Kevin, MD 02/13/21 1440  

## 2021-02-19 ENCOUNTER — Encounter: Payer: Self-pay | Admitting: Obstetrics and Gynecology

## 2021-02-19 ENCOUNTER — Ambulatory Visit (INDEPENDENT_AMBULATORY_CARE_PROVIDER_SITE_OTHER): Payer: Medicaid Other | Admitting: Obstetrics and Gynecology

## 2021-02-19 ENCOUNTER — Other Ambulatory Visit: Payer: Self-pay

## 2021-02-19 VITALS — BP 118/76 | Ht 67.0 in | Wt 185.0 lb

## 2021-02-19 DIAGNOSIS — R102 Pelvic and perineal pain: Secondary | ICD-10-CM | POA: Diagnosis not present

## 2021-02-20 NOTE — Progress Notes (Signed)
Obstetrics & Gynecology Office Visit   Chief Complaint:  Chief Complaint  Patient presents with  . Vaginal Prolapse    History of Present Illness: 35 y.o. [id number] presenting for evaluation of pelvic organ prolapse.  She was told she might have pelvic organ prolapse based on her most recent annual exam 02/01/2021 although her physical exam that visit is documented as normal pelvic exam.  She did have some mild uterine prolapse in the immediate postpartum period following her most recent delivery but it had been discussed that this generally resolves within the first 6 months following delivery.  She had a recent ER visit for acute BLQ pain with normal pelvic imaging at that time.  She presents today for exam to see if she does have any evidence of prolapse   Review of Systems: Review of Systems  Constitutional: Negative.   Gastrointestinal: Negative.   Genitourinary: Negative.      Past Medical History:  Past Medical History:  Diagnosis Date  . Anemia   . Anxiety   . ASCUS favor benign   . History of preterm delivery    with spontaneous labor in 2010 and 2015  . History of preterm premature rupture of membranes (PPROM)    2010 at 32 weeks  . HPV in female   . HSV infection   . Hypotension   . Premature rupture of membranes 10/30/2019  . Scoliosis   . Trichomonal fluor vaginalis 01/23/2014   Overview:  Will NEED REPEAT HIV TESTING!!!     Past Surgical History:  Past Surgical History:  Procedure Laterality Date  . CERVICAL CERCLAGE  08/31/2020  . CESAREAN SECTION N/A 11/06/2019   Procedure: CESAREAN SECTION;  Surgeon: Malachy Mood, MD;  Location: [facility] ORS;  Service: Obstetrics;  Laterality: N/A;  . INDUCED ABORTION    . LAPAROSCOPIC BILATERAL SALPINGECTOMY Bilateral 01/10/2020   Procedure: LAPAROSCOPIC BILATERAL SALPINGECTOMY;  Surgeon: Malachy Mood, MD;  Location: [facility] ORS;  Service: Gynecology;  Laterality: Bilateral;    Gynecologic History: No LMP  recorded. Patient has had an injection.  Obstetric History: [id number]  Family History:  Family History  Problem Relation Age of Onset  . Osteoarthritis Mother   . Asthma Mother   . Hypertension Mother   . Osteoarthritis Father   . Hypertension Father   . Osteoarthritis Maternal Grandmother   . Asthma Maternal Grandmother   . Cancer Maternal Grandmother        breast cancer  . Diabetes Maternal Grandmother   . Heart failure Maternal Grandmother   . Hyperlipidemia Maternal Grandmother   . Hypertension Maternal Grandmother   . Migraines Maternal Grandmother   . Thyroid disease Maternal Grandmother   . Osteoarthritis Paternal Grandmother   . Asthma Paternal Grandmother   . Cancer Paternal Grandmother        stomach  . Diabetes Paternal Grandmother   . Heart failure Paternal Grandmother   . Hyperlipidemia Paternal Grandmother   . Hypertension Paternal Grandmother   . Breast cancer Paternal Aunt   . Hypertension Sister     Social History:  Social History   Socioeconomic History  . Marital status: Single    Spouse name: Not on file  . Number of children: Not on file  . Years of education: Not on file  . Highest education level: Not on file  Occupational History  . Not on file  Tobacco Use  . Smoking status: Never Smoker  . Smokeless tobacco: Never Used  Vaping Use  . Vaping  Use: Never used  Substance and Sexual Activity  . Alcohol use: No  . Drug use: No  . Sexual activity: Yes    Birth control/protection: Surgical    Comment: Hysterectomy 01/10/20  Other Topics Concern  . Not on file  Social History Narrative  . Not on file   Social Determinants of Health   Financial Resource Strain: Not on file  Food Insecurity: Not on file  Transportation Needs: Not on file  Physical Activity: Not on file  Stress: Not on file  Social Connections: Not on file  Intimate Partner Violence: Not on file    Allergies:  Allergies  Allergen Reactions  . Amoxicillin Hives  .  Bee Venom Swelling and Shortness Of Breath  . Coconut Oil Shortness Of Breath and Swelling  . Mushroom Extract Complex Swelling  . Other Anaphylaxis and Swelling    Coconut    Medications: Prior to Admission medications   Medication Sig Start Date End Date Taking? Authorizing Provider  oxyCODONE-acetaminophen (PERCOCET/ROXICET) 5-325 MG tablet Take 1 tablet by mouth every 6 (six) hours. 12/31/20  Yes [provider]  cyclobenzaprine (FLEXERIL) 10 MG tablet Take 1 tablet by mouth 3 (three) times daily. Patient not taking: Reported on 02/19/2021 01/30/21   [provider]  medroxyPROGESTERone (DEPO-PROVERA) 150 MG/ML injection Inject 1 mL (150 mg total) into the muscle every 3 (three) months. 02/01/21   Imagene Riches, CNM  naproxen (NAPROSYN) 500 MG tablet Take 1 tablet (500 mg total) by mouth 2 (two) times daily with a meal. Patient not taking: Reported on 02/19/2021 02/13/21   Sable Feil, PA-C  NARCAN 4 MG/0.1ML LIQD nasal spray kit 1 spray as directed. Patient not taking: Reported on 02/19/2021 12/26/20   [provider]  albuterol (PROVENTIL HFA;VENTOLIN HFA) 108 (90 Base) MCG/ACT inhaler Inhale 2 puffs into the lungs every 6 (six) hours as needed for wheezing or shortness of breath. 10/10/17 04/03/19  Versie Starks, PA-C    Physical Exam Vitals:  Vitals:   02/19/21 1612  BP: 118/76   No LMP recorded. Patient has had an injection.  General: NAD, well nourished, appears stated age 28: normocephalic, anicteric Pulmonary: No increased work of breathing Genitourinary:  External: Normal external female genitalia.  Normal urethral meatus, normal  Bartholin's and Skene's glands.    Vagina: Normal vaginal mucosa, no evidence of prolapse.  Speculum taken apart to isolate anterior posterior wall still with no prolapse.  Normal introitus, vaginal hiatus.  Normal perineal body.  Cervix: Grossly normal in appearance, no bleeding  Uterus: Non-enlarged, mobile,  normal contour.  No CMT  Adnexa: ovaries non-enlarged, no adnexal masses  Rectal: deferred  Lymphatic: no evidence of inguinal lymphadenopathy Extremities: no edema, erythema, or tenderness Neurologic: Grossly intact Psychiatric: mood appropriate, affect full  Female chaperone present for pelvic  portions of the physical exam  Assessment: 35 y.o. [id number] evaluation of POP  Plan: Problem List Items Addressed This Visit   None   Visit Diagnoses    Pelvic pain    -  Primary     1) Pelvic exam - no evidence of any appreciable pelvic organ prolapse on exam  2) Reports history of recently imaged ovarian cyst - 01/29/2021 ultrasound reviewed showing normal uterus and ovaries  3) A total of 15 minutes were spent in face-to-face contact with the patient during this encounter with over half of that time devoted to counseling and coordination of care.  4) Return in about 1 year (around 02/19/2022)  for annual.   Malachy Mood, MD, [facility], [facility] 02/20/2021, 8:34 PM

## 2021-04-17 ENCOUNTER — Ambulatory Visit: Payer: Medicaid Other

## 2021-04-18 ENCOUNTER — Ambulatory Visit: Payer: Medicaid Other

## 2022-02-28 ENCOUNTER — Ambulatory Visit: Payer: Medicaid Other

## 2022-03-04 ENCOUNTER — Other Ambulatory Visit: Payer: Medicaid Other

## 2022-03-10 ENCOUNTER — Ambulatory Visit: Payer: Medicaid Other

## 2022-08-25 ENCOUNTER — Ambulatory Visit: Payer: Medicaid Other | Admitting: Family Medicine

## 2022-10-06 ENCOUNTER — Encounter: Payer: Self-pay | Admitting: Obstetrics & Gynecology

## 2022-10-06 ENCOUNTER — Ambulatory Visit (INDEPENDENT_AMBULATORY_CARE_PROVIDER_SITE_OTHER): Payer: Medicaid Other | Admitting: Obstetrics & Gynecology

## 2022-10-06 VITALS — BP 129/86 | HR 94 | Resp 16 | Ht 67.0 in | Wt 184.7 lb

## 2022-10-06 DIAGNOSIS — N812 Incomplete uterovaginal prolapse: Secondary | ICD-10-CM | POA: Insufficient documentation

## 2022-10-06 DIAGNOSIS — R232 Flushing: Secondary | ICD-10-CM

## 2022-10-06 DIAGNOSIS — R4586 Emotional lability: Secondary | ICD-10-CM

## 2022-10-06 DIAGNOSIS — R350 Frequency of micturition: Secondary | ICD-10-CM

## 2022-10-06 DIAGNOSIS — N938 Other specified abnormal uterine and vaginal bleeding: Secondary | ICD-10-CM

## 2022-10-06 DIAGNOSIS — R6889 Other general symptoms and signs: Secondary | ICD-10-CM

## 2022-10-06 DIAGNOSIS — Z862 Personal history of diseases of the blood and blood-forming organs and certain disorders involving the immune mechanism: Secondary | ICD-10-CM | POA: Diagnosis not present

## 2022-10-06 DIAGNOSIS — N3946 Mixed incontinence: Secondary | ICD-10-CM | POA: Insufficient documentation

## 2022-10-06 LAB — POCT URINALYSIS DIPSTICK
Bilirubin, UA: NEGATIVE
Glucose, UA: NEGATIVE
Ketones, UA: NEGATIVE
Nitrite, UA: NEGATIVE
Protein, UA: POSITIVE — AB
Spec Grav, UA: 1.02 (ref 1.010–1.025)
Urobilinogen, UA: 0.2 E.U./dL
pH, UA: 7 (ref 5.0–8.0)

## 2022-10-06 MED ORDER — TOLTERODINE TARTRATE ER 2 MG PO CP24: 2.0000 mg | ORAL_CAPSULE | Freq: Every day | ORAL | 4 refills | Status: AC

## 2022-10-06 NOTE — Progress Notes (Signed)
    GYNECOLOGY PROGRESS NOTE  Subjective:    Patient ID: Lisa Rowland, female    DOB: October 13, 1985, 37 y.o.   MRN: [id number]  HPI  Patient is a 37 y.o. [id number] female who presents for evaluation urinary incontinence. She has to go to the bathroom immediately especially when she wakes up in the morning, she also has frequent nocturia (about 6 times per night). She wears pads daily and frequently wets her pants. This is urge and stress incontinence.   She has been having irregular bleeding with clots. She uses depo provera to help with her heavy periods. She had a bilateral salpingectomy for contraception. She has bleeding with intercourse. She is concerned that she is anemic (is always cold and has a h/o anmeia). She is on depo provera injection to control the irregular bleeding, now she is having breakthrough bleeding. She has concerns that she maybe going through menopause because she has hot flashes and mood swings x 3 months.  She has seen Dr. Star Age in the past and has worked on doing Kegel exercises The following portions of the patient's history were reviewed and updated as appropriate: allergies, current medications, past family history, past medical history, past social history, past surgical history, and problem list.  Review of Systems Pertinent items noted in HPI and remainder of comprehensive ROS otherwise negative.  She had a normal pap in 2022. Her close female relatives have all had hysterectomies.   Objective:   There were no vitals taken for this visit. There is no height or weight on file to calculate BMI. General appearance: alert, cooperative, and no distress Abdomen: soft, non-tender; bowel sounds normal; no masses,  no organomegaly Pelvic: cervix normal in appearance, external genitalia normal, no adnexal masses or tenderness, no cervical motion tenderness, rectovaginal septum normal, uterus normal size, shape, and consistency, and vagina normal without discharge I  examined her in the dorsal lithotomy position and also in semi Fowler's She has Grade 2 uterine prolapse and cystocele  Extremities: extremities normal, atraumatic, no cyanosis or edema Neurologic: Grossly normal   Assessment:  Mixed urinary incontinence- I will prescribe detrol la 2 mg to help with the urge incontinence. She is aware that this will not help the GSUI. Also check UC&S, UA  With regard to her cold intolerance and h/o anemia, I will check  a CBC and TSH  With regard to her DUB, I feel that this is likely due to depo provera, but will get an ultrasound to rule out other causes.  Hot flashes/mood swings- check [place]  She will come back and see Dr. Amalia Hailey  Plan:   There are no diagnoses linked to this encounter.    Clovia Cuff, MD [place].

## 2022-10-07 LAB — CBC
Hematocrit: 36.6 % (ref 34.0–46.6)
Hemoglobin: 12.4 g/dL (ref 11.1–15.9)
MCH: 28.9 pg (ref 26.6–33.0)
MCHC: 33.9 g/dL (ref 31.5–35.7)
MCV: 85 fL (ref 79–97)
Platelets: 272 10*3/uL (ref 150–450)
RBC: 4.29 x10E6/uL (ref 3.77–5.28)
RDW: 12.6 % (ref 11.7–15.4)
WBC: 6.4 10*3/uL (ref 3.4–10.8)

## 2022-10-07 LAB — TSH+FREE T4
Free T4: 1.17 ng/dL (ref 0.82–1.77)
TSH: 1.63 u[IU]/mL (ref 0.450–4.500)

## 2022-10-07 LAB — FOLLICLE STIMULATING HORMONE: FSH: 5.9 m[IU]/mL

## 2022-10-09 LAB — URINE CULTURE

## 2022-10-10 ENCOUNTER — Other Ambulatory Visit: Payer: Self-pay | Admitting: Obstetrics & Gynecology

## 2022-10-10 ENCOUNTER — Encounter: Payer: Self-pay | Admitting: Obstetrics & Gynecology

## 2022-10-10 MED ORDER — CLINDAMYCIN HCL 300 MG PO CAPS: 300.0000 mg | ORAL_CAPSULE | Freq: Three times a day (TID) | ORAL | 0 refills | Status: AC

## 2022-10-10 NOTE — Progress Notes (Signed)
GBS UTI treated with clinda (pen allergy). Patient notified.

## 2022-10-16 ENCOUNTER — Telehealth: Payer: Self-pay

## 2022-10-16 NOTE — Telephone Encounter (Signed)
Patient came by our office to check on rx that was to be sent at her 10/06/2012 visit. She states the pharmacy doesn't have the rx.

## 2022-10-16 NOTE — Telephone Encounter (Signed)
Spoke with [facility]. They confirmed they do have the rx. However, it is not covered by the patient's insurance.

## 2022-11-05 ENCOUNTER — Ambulatory Visit: Payer: Medicaid Other | Admitting: Obstetrics and Gynecology

## 2022-11-05 DIAGNOSIS — N812 Incomplete uterovaginal prolapse: Secondary | ICD-10-CM

## 2022-11-05 DIAGNOSIS — N3946 Mixed incontinence: Secondary | ICD-10-CM

## 2023-03-11 ENCOUNTER — Ambulatory Visit: Payer: Medicaid Other | Admitting: Obstetrics

## 2023-03-11 DIAGNOSIS — Z01419 Encounter for gynecological examination (general) (routine) without abnormal findings: Secondary | ICD-10-CM

## 2024-01-03 ENCOUNTER — Emergency Department: Admission: EM | Admit: 2024-01-03 | Discharge: 2024-01-03 | Disposition: A | Discharge status: Home / Self Care

## 2024-01-03 ENCOUNTER — Other Ambulatory Visit: Payer: Self-pay

## 2024-01-03 DIAGNOSIS — M436 Torticollis: Secondary | ICD-10-CM | POA: Diagnosis not present

## 2024-01-03 DIAGNOSIS — I10 Essential (primary) hypertension: Secondary | ICD-10-CM | POA: Insufficient documentation

## 2024-01-03 DIAGNOSIS — E039 Hypothyroidism, unspecified: Secondary | ICD-10-CM | POA: Diagnosis not present

## 2024-01-03 DIAGNOSIS — M7918 Myalgia, other site: Secondary | ICD-10-CM

## 2024-01-03 DIAGNOSIS — M542 Cervicalgia: Secondary | ICD-10-CM | POA: Diagnosis present

## 2024-01-03 MED ORDER — LIDOCAINE 5 % EX PTCH: 1.0000 | MEDICATED_PATCH | Freq: Two times a day (BID) | CUTANEOUS | 0 refills | Status: AC | PRN

## 2024-01-03 MED ORDER — METHOCARBAMOL 500 MG PO TABS
500.0000 mg | ORAL_TABLET | Freq: Once | ORAL | Status: AC
  Administered 2024-01-03: 500 mg via ORAL
  Filled 2024-01-03: qty 1

## 2024-01-03 MED ORDER — LIDOCAINE 5 % EX PTCH
1.0000 | MEDICATED_PATCH | Freq: Once | CUTANEOUS | Status: DC
  Administered 2024-01-03: 1 via TRANSDERMAL
  Filled 2024-01-03: qty 1

## 2024-01-03 MED ORDER — LIDOCAINE HCL (PF) 1 % IJ SOLN: 5.0000 mL | Freq: Once | INTRAMUSCULAR | Status: DC

## 2024-01-03 MED ORDER — METHOCARBAMOL 750 MG PO TABS: 750.0000 mg | ORAL_TABLET | Freq: Three times a day (TID) | ORAL | 1 refills | Status: AC | PRN

## 2024-01-03 MED ORDER — NAPROXEN 500 MG PO TABS
500.0000 mg | ORAL_TABLET | Freq: Once | ORAL | Status: AC
  Administered 2024-01-03: 500 mg via ORAL
  Filled 2024-01-03: qty 1

## 2024-01-03 MED ORDER — NAPROXEN 500 MG PO TABS: 500.0000 mg | ORAL_TABLET | Freq: Two times a day (BID) | ORAL | 1 refills | Status: AC

## 2024-01-03 NOTE — Discharge Instructions (Addendum)
 Take the prescription meds as directed. Follow-up with your primary provider as needed.

## 2024-01-03 NOTE — ED Notes (Signed)
 See triage notes. Patient c/o neck pain/stiffness/tightness for the past week with numbness to the whole left side. Patient denies injury. Patient stated it began effecting her voice yesterday causing her to sound hoarse. Patient noted to be moving her left side

## 2024-01-03 NOTE — ED Triage Notes (Signed)
 Pt to ED for L neck pain since 1 week ago, worse since yesterday. States neck feels tight. Hard to move and dress self. Pain is causing numbness down L arm (since days). Pain is shooting down L arm, back and also has L leg pain. Also, states voice is hoarse. L arm and neck do appear stiff. Pt also complaining of whole body generalized pain. States cannot sleep. Pt cannot sit still in wheelchair.

## 2024-01-03 NOTE — ED Provider Notes (Signed)
 [facility] Emergency Department Provider Note     Event Date/Time   First MD Initiated Contact with Patient 01/03/24 1635     (approximate)   History   Neck Pain   HPI  Lisa Rowland is a 38 y.o. female with a history of bipolar disorder, headache, fibromyalgia, anemia, hypertension, and hypothyroidism, presents to the ED endorsing left-sided neck pain for the last week.  She reports increased pain yesterday.  She reports stiffness and tightness in the neck and difficulty moving.  She has had some intermittent paresthesias down the left arm.  No frank chest pain is reported.  No gross weakness or edema.  Patient reports she has not taken medicines in several days, she believes that may be the reason for the slight change in her voice as well as her generalized body pain.  Bladder or bowel incontinence, foot drop, saddle anesthesias reported.  Physical Exam   Triage Vital Signs: ED Triage Vitals  Encounter Vitals Group     BP 01/03/24 1605 100/74     Systolic BP Percentile --      Diastolic BP Percentile --      Pulse Rate 01/03/24 1605 92     Resp 01/03/24 1605 20     Temp 01/03/24 1605 98.2 F (36.8 C)     Temp Source 01/03/24 1605 Oral     SpO2 01/03/24 1605 94 %     Weight 01/03/24 1607 172 lb (78 kg)     Height 01/03/24 1607 5\' 7"  (1.702 m)     Head Circumference --      Peak Flow --      Pain Score 01/03/24 1604 10     Pain Loc --      Pain Education --      Exclude from Growth Chart --     Most recent vital signs: Vitals:   01/03/24 1605  BP: 100/74  Pulse: 92  Resp: 20  Temp: 98.2 F (36.8 C)  SpO2: 94%    General Awake, no distress. NAD HEENT NCAT. PERRL. EOMI. No rhinorrhea. Mucous membranes are moist.  CV:  Good peripheral perfusion. RRR RESP:  Normal effort. CTA ABD:  No distention.  MSK:  Normal spinal alignment without midline tenderness, spasm, deformity, or step-off.  Active range of motion of extremities noted.  To  palpation to the left SCM musculature and left upper trapezius musculature. NEURO: Cranial nerves II to XII grossly intact.  Normal UE/LE DTRs bilaterally.  ED Results / Procedures / Treatments   Labs (all labs ordered are listed, but only abnormal results are displayed) Labs Reviewed - No data to display   EKG  RADIOLOGY  No results found.   PROCEDURES:  Critical Care performed: No  Procedures   MEDICATIONS ORDERED IN ED: Medications  methocarbamol  (ROBAXIN ) tablet 500 mg (500 mg Oral Given 01/03/24 1748)  naproxen  (NAPROSYN ) tablet 500 mg (500 mg Oral Given 01/03/24 1748)     IMPRESSION / MDM / ASSESSMENT AND PLAN / ED COURSE  I reviewed the triage vital signs and the nursing notes.                              Differential diagnosis includes, but is not limited to, myalgias, torticollis, muscle strain, paresthesias, hypothyroidism, cervical radiculopathy  Patient's presentation is most consistent with acute presentation with potential threat to life or bodily function.  Patient's diagnosis is consistent  with torticollis and muscle pain.  With return exam and workup at this time.  No evidence of any acute neuromuscular deficits.  Exam is reassuring overall.  Seemed like represent an acute muscle spasm to the left SCM and trapezius musculature.  Patient will be discharged home with prescriptions for Lidoderm  patches, methocarbamol , and naproxen . Patient is to follow up with her PCP as discussed, as needed or otherwise directed. Patient is given ED precautions to return to the ED for any worsening or new symptoms.   FINAL CLINICAL IMPRESSION(S) / ED DIAGNOSES   Final diagnoses:  Torticollis, acute  Musculoskeletal pain     Rx / DC Orders   ED Discharge Orders          Ordered    lidocaine  (LIDODERM ) 5 %  Every 12 hours PRN        01/03/24 1739    methocarbamol  (ROBAXIN -750) 750 MG tablet  Every 8 hours PRN        01/03/24 1739    naproxen  (NAPROSYN ) 500 MG  tablet  2 times daily with meals        01/03/24 1739             Note:  This document was prepared using Dragon voice recognition software and may include unintentional dictation errors.    May Sparks, PA-C 01/04/24 2354    Collis Deaner, MD 01/12/24 [id number]

## 2024-09-19 ENCOUNTER — Ambulatory Visit
